# Patient Record
Sex: Female | Born: 1955 | Race: White | Hispanic: No | Smoking: Current every day smoker
Health system: Southern US, Community
[De-identification: ages and names within clinical notes are randomized; demographics above are authoritative.]

## PROBLEM LIST (undated history)

## (undated) DIAGNOSIS — I10 Essential (primary) hypertension: Secondary | ICD-10-CM

## (undated) DIAGNOSIS — Z9889 Other specified postprocedural states: Secondary | ICD-10-CM

## (undated) DIAGNOSIS — C50919 Malignant neoplasm of unspecified site of unspecified female breast: Secondary | ICD-10-CM

## (undated) DIAGNOSIS — M1990 Unspecified osteoarthritis, unspecified site: Secondary | ICD-10-CM

## (undated) DIAGNOSIS — M8580 Other specified disorders of bone density and structure, unspecified site: Secondary | ICD-10-CM

## (undated) DIAGNOSIS — E785 Hyperlipidemia, unspecified: Secondary | ICD-10-CM

## (undated) DIAGNOSIS — J189 Pneumonia, unspecified organism: Secondary | ICD-10-CM

## (undated) HISTORY — DX: Malignant neoplasm of unspecified site of unspecified female breast: C50.919

## (undated) HISTORY — PX: SALPINGECTOMY: SHX328

## (undated) HISTORY — PX: APPENDECTOMY: SHX54

## (undated) HISTORY — PX: ABDOMINAL HYSTERECTOMY: SHX81

## (undated) HISTORY — PX: KNEE ARTHROSCOPY WITH MENISCAL REPAIR: SHX5653

## (undated) HISTORY — PX: KNEE SURGERY: SHX244

## (undated) HISTORY — PX: OTHER SURGICAL HISTORY: SHX169

## (undated) HISTORY — DX: Other specified disorders of bone density and structure, unspecified site: M85.80

## (undated) HISTORY — PX: CHOLECYSTECTOMY: SHX55

## (undated) HISTORY — PX: SALPINGOOPHORECTOMY: SHX82

---

## 2011-05-12 DIAGNOSIS — Z9109 Other allergy status, other than to drugs and biological substances: Secondary | ICD-10-CM | POA: Insufficient documentation

## 2011-05-12 DIAGNOSIS — K219 Gastro-esophageal reflux disease without esophagitis: Secondary | ICD-10-CM | POA: Insufficient documentation

## 2014-02-13 ENCOUNTER — Observation Stay: Payer: Self-pay | Admitting: Internal Medicine

## 2014-02-13 LAB — URINALYSIS, COMPLETE
BILIRUBIN, UR: NEGATIVE
Bacteria: NONE SEEN
Glucose,UR: NEGATIVE mg/dL (ref 0–75)
KETONE: NEGATIVE
Leukocyte Esterase: NEGATIVE
NITRITE: NEGATIVE
Ph: 7 (ref 4.5–8.0)
Protein: NEGATIVE
RBC,UR: 1 /HPF (ref 0–5)
Specific Gravity: 1.019 (ref 1.003–1.030)
Squamous Epithelial: NONE SEEN
WBC UR: NONE SEEN /HPF (ref 0–5)

## 2014-02-13 LAB — CBC
HCT: 41.6 % (ref 35.0–47.0)
HGB: 13.7 g/dL (ref 12.0–16.0)
MCH: 28.4 pg (ref 26.0–34.0)
MCHC: 32.8 g/dL (ref 32.0–36.0)
MCV: 87 fL (ref 80–100)
Platelet: 235 10*3/uL (ref 150–440)
RBC: 4.8 10*6/uL (ref 3.80–5.20)
RDW: 13.9 % (ref 11.5–14.5)
WBC: 8.8 10*3/uL (ref 3.6–11.0)

## 2014-02-13 LAB — COMPREHENSIVE METABOLIC PANEL
ALK PHOS: 95 U/L
Albumin: 3.9 g/dL (ref 3.4–5.0)
Anion Gap: 6 — ABNORMAL LOW (ref 7–16)
BILIRUBIN TOTAL: 0.3 mg/dL (ref 0.2–1.0)
BUN: 9 mg/dL (ref 7–18)
CO2: 25 mmol/L (ref 21–32)
CREATININE: 0.88 mg/dL (ref 0.60–1.30)
Calcium, Total: 9 mg/dL (ref 8.5–10.1)
Chloride: 109 mmol/L — ABNORMAL HIGH (ref 98–107)
EGFR (Non-African Amer.): >60
Glucose: 90 mg/dL (ref 65–99)
OSMOLALITY: 278 (ref 275–301)
POTASSIUM: 3.6 mmol/L (ref 3.5–5.1)
SGOT(AST): 24 U/L (ref 15–37)
SGPT (ALT): 15 U/L (ref 12–78)
Sodium: 140 mmol/L (ref 136–145)
TOTAL PROTEIN: 7.4 g/dL (ref 6.4–8.2)

## 2014-02-13 LAB — LIPASE, BLOOD: LIPASE: 165 U/L (ref 73–393)

## 2014-02-13 LAB — HEMOGLOBIN: HGB: 13 g/dL (ref 12.0–16.0)

## 2014-02-14 LAB — CBC WITH DIFFERENTIAL/PLATELET
BASOS ABS: 0 10*3/uL (ref 0.0–0.1)
Basophil %: 0.3 %
EOS PCT: 2 %
Eosinophil #: 0.1 10*3/uL (ref 0.0–0.7)
HCT: 39.3 % (ref 35.0–47.0)
HGB: 12.9 g/dL (ref 12.0–16.0)
Lymphocyte #: 1.3 10*3/uL (ref 1.0–3.6)
Lymphocyte %: 24.9 %
MCH: 28.7 pg (ref 26.0–34.0)
MCHC: 32.9 g/dL (ref 32.0–36.0)
MCV: 87 fL (ref 80–100)
Monocyte #: 0.5 x10 3/mm (ref 0.2–0.9)
Monocyte %: 8.8 %
Neutrophil #: 3.3 10*3/uL (ref 1.4–6.5)
Neutrophil %: 64 %
PLATELETS: 203 10*3/uL (ref 150–440)
RBC: 4.51 10*6/uL (ref 3.80–5.20)
RDW: 13.5 % (ref 11.5–14.5)
WBC: 5.2 10*3/uL (ref 3.6–11.0)

## 2014-02-14 LAB — TSH: Thyroid Stimulating Horm: 2.62 u[IU]/mL

## 2014-02-14 LAB — BASIC METABOLIC PANEL
ANION GAP: 1 — AB (ref 7–16)
BUN: 7 mg/dL (ref 7–18)
CO2: 29 mmol/L (ref 21–32)
CREATININE: 0.9 mg/dL (ref 0.60–1.30)
Calcium, Total: 8.2 mg/dL — ABNORMAL LOW (ref 8.5–10.1)
Chloride: 112 mmol/L — ABNORMAL HIGH (ref 98–107)
EGFR (African American): >60
EGFR (Non-African Amer.): >60
Glucose: 83 mg/dL (ref 65–99)
Osmolality: 280 (ref 275–301)
Potassium: 3.9 mmol/L (ref 3.5–5.1)
SODIUM: 142 mmol/L (ref 136–145)

## 2014-02-14 LAB — MAGNESIUM: Magnesium: 2 mg/dL

## 2015-01-08 NOTE — Consult Note (Signed)
PATIENT NAME:  Annette Richard, Annette Richard MR#:  [id number] DATE OF BIRTH:  11-Mar-1956  DATE OF CONSULTATION:  02/13/2014  CONSULTING PHYSICIAN:  Annette Sails, MD  Patient of Dr. Bridgett Richard  REASON FOR CONSULTATION: Lower GI bleed/hematochezia.   HISTORY OF PRESENT ILLNESS: [name] is a 59 year old Caucasian female who was in her usual state of health until yesterday morning. When she awoke in the morning, she found that she was somewhat shaky, and when she had a bowel movement she noted blood in the toilet. This was bright red in nature. Since that time, she has had multiple smaller episodes of bleeding. She relates having some pain in her left side and left lower quadrant. She had no pain, however, before the episode. There has been some nausea, but no vomiting. She states that she had a similar episode last year, and was admitted to the hospital in [facility]. At that time, she underwent a colonoscopy with polypectomy and finding of internal hemorrhoids. She states that the hemorrhoids were banded. She also had a cholecystectomy at that time. She does have occasional heartburn, perhaps less than once a week, occasionally using Prilosec. There is no dysphagia. In terms of bowel habits, she usually has a daily bowel movement. She has seen no black stool, blood in the stool, or slimy stools. Otherwise, she has had no change to her bowel habits. Currently she is hemodynamically stable. She states that the last time she saw some blood was when she was downstairs in the Emergency Room. She did not have a rectal examination in the Emergency Room.   PAST MEDICAL HISTORY: She has a history of a cholecystectomy, that being done about a year ago. She denies any other ongoing medical treatment. She has not had any other surgeries.  The patient does smoke tobacco. She has a history of a pulmonary nodule. Gastrointestinal family history is negative for colorectal cancer, liver disease, or ulcers. The patient herself has  never had an ulcer in the past.   OUTPATIENT MEDICATIONS:  None.  PHYSICAL EXAMINATION: VITAL SIGNS: Temperature is 97.9, pulse 58, respirations 18, blood pressure 130/83, pulse ox 99%.  GENERAL: She is a 59 year old Caucasian female in no acute distress, somewhat nervous.  HEENT: Normocephalic, atraumatic. Eyes are anicteric. Nose: Septum midline, no lesions.  Oropharynx: No lesions.  NECK: Supple. No JVD. No lymphadenopathy. No thyromegaly.  HEART: Regular rate and rhythm.  LUNGS: Clear.  ABDOMEN: Soft. She is tender to palpation in the left abdomen, extending from the lower left to the upper left abdomen. There are no masses, rebound, or organomegaly. Bowel sounds positive, normoactive.  RECTAL: Anorectal examination shows no evidence of gross bleeding in the rectal vault. There is some mild mucus in the vault. This is not heme-stained. There is what appears to be a small spot on an external skin tag, possibly a decompressed hemorrhoid. This is slightly tender to palpation.   LABORATORIES INCLUDE THE FOLLOWING: On admission, she had a glucose of 90, BUN 9, creatinine 0.88, sodium 140, potassium 3.6, chloride 109, bicarb 25, calcium 9.0, lipase 165. Hepatic profile is normal. Hemogram is normal, with a hemoglobin and hematocrit of 13.7/41.6, respectively. Urinalysis showed 2+ blood, leukocyte esterase negative. The RBC count, however, was 1 per high-power field, white cell count was none.   She had a CT scan of the abdomen and pelvis with contrast IV, this showed impression of:  1. No explanation for patient's abdominal pain and rectal bleeding.  2. Moderate colonic stool burden  without evidence of obstruction.  3. Colonic diverticulosis without evidence of diverticulitis.  4. Post-cholecystectomy, appendectomy, and hysterectomy.  5. Indeterminate 3 mm nodule in the imaged left lower lobe, with recommendation to repeat the CT scan in a year. She is a high risk for bronchogenic carcinoma.    ASSESSMENT:  1. Rectal bleeding. The vault is empty, and there is no evidence of melena. There is, however, heme-positive mucus on the glove. This is quite likely related with the possibility of hemorrhoid. There is what appears to be a decompressed hemorrhoid on the outside.   2. Left-sided abdominal pain of uncertain etiology. The patient does have diverticulosis on a CT scan.   RECOMMENDATION: 1. Will start local treatment for anal outlet bleeding/hemorrhoidal bleeding.  2. Discontinue IV proton pump inhibitor drip.  3. Will follow. Recheck a hemoglobin in about 8 hours or so, transfuse if needed.      ____________________________ Annette Sails, MD mus:mr D: 02/13/2014 17:55:52 ET T: 02/13/2014 18:05:41 ET JOB#: [id number]  cc: Annette Sails, MD, <Dictator> Annette Sails MD ELECTRONICALLY SIGNED 03/11/2014 9:08

## 2015-01-08 NOTE — Discharge Summary (Signed)
PATIENT NAME:  Annette Richard, Annette Richard MR#:  [id number] DATE OF BIRTH:  13-Jun-1956  DATE OF ADMISSION:  02/13/2014  DATE OF DISCHARGE:  02/14/2014  ADMITTING DIAGNOSIS: Rectal bleeding.  DISCHARGE DIAGNOSES:  1.  Rectal bleeding from hemorrhoids.  2.  A 3 mm nodule in the left lower lobe.  3.  Smoking dependence.   CONSULTATIONS: Dr. Gustavo Lah.   DATA:  CT scan showed an indeterminate 3 mm nodule within the imaged left lower lobe. She has moderate colonic stool burden without evidence of obstruction. Discharge white blood cells 5.2, hemoglobin 13, hematocrit 39.3, platelets are 203.   HOSPITAL COURSE: A very pleasant, 59 year old female who presented with rectal bleeding. For further details, please refer to the H and P. 1.  Rectal bleeding secondary to external hemorrhoids. GI was consulted. Dr. Gustavo Lah felt that this was related to her external hemorrhoids. He recommended external hemorrhoid cream. The patient had no bleeding while in the hospital. 2.  A 3 mm nodule. I explained to the patient that she has abnormal finding, and given her history of smoking dependence, she needs close followup. She does not have a PCP, but she says she will find a PCP, and I stressed the importance of making sure that she has a followup CAT scan, and also wrote this in her discharge instructions. 3.  Smoking dependence. The patient will be discharged with a nicotine patch.   DISCHARGE MEDICATIONS: 1.  Protonix 40 mg daily.  2.  Nicotine patch 14 mg over 24 hours.  3.  Hydrocortisone 1 application t.i.d. for 9 days.   INSTRUCTIONS: Discharge diet: Regular diet. Discharge activity: As tolerated. Discharge followup: The patient will follow up with an M.D. Again, it was stressed to her the importance of repeating a CT chest in 1 year to evaluate this nodule.   TIME SPENT: 35 minutes.   The patient was medically stable for discharge.    ____________________________ Dewight Catino P. Benjie Karvonen, MD spm:mr D: 02/14/2014  11:40:02 ET T: 02/14/2014 17:13:08 ET JOB#: [id number]  cc: Viviene Thurston P. Benjie Karvonen, MD, <Dictator> Donell Beers Yolonda Purtle MD ELECTRONICALLY SIGNED 02/15/2014 7:26

## 2015-01-08 NOTE — Consult Note (Signed)
Brief Consult Note: Diagnosis: rectal bleeding.   Patient was seen by consultant.   Consult note dictated.   Recommend further assessment or treatment.   Orders entered.   Comments: Please see full GI consult [phone].  Patietn admitted with multiple eposodes of "bright red rectal bleeing"  Some c/o left sided abdominal pain.  DRE negative for melena or obvious blood in the rectal vault.  Evidence of external hemorrhois with possible stigmata of recent bleeding.   Will d.c protonix drip, place on single dose daily of omeprazole which she uses at home for occasional GERD sx.  Will start rectal corticosteroid for anal outlet bleeding.  Continue observation, following. .  Electronic Signatures: Loistine Simas (MD)  (Signed 30-May-15 18:00)  Authored: Brief Consult Note   Last Updated: 30-May-15 18:00 by Loistine Simas (MD)

## 2015-01-08 NOTE — H&P (Signed)
PATIENT NAME:  Annette, Richard MR#:  [id number] DATE OF BIRTH:  [birth year]  DATE OF ADMISSION:  02/13/2014  PRIMARY CARE PHYSICIAN:  No local.  REFERRING PHYSICIAN:  Dr. Benjaman Lobe.   CHIEF COMPLAINT:  Bloody stools since yesterday.   HISTORY OF PRESENT ILLNESS:  A 59 year old Caucasian female with no past medical history, presented to the ED with bloody stools since yesterday. The patient noticed to have a fresh bloody stool in the toilet yesterday. She said that it was lot. In addition, the patient has multiple times of bloody stool since yesterday. The patient feels weak and dizzy. Also she complains of abdominal cramp on the left side associated with nausea. The patient had a colonoscopy one year ago. A polyp was removed.   PAST MEDICAL HISTORY:  Annette Richard.  PAST SURGICAL HISTORY:  Cholecystectomy, hysterectomy, knee surgery, hemorrhoid surgery.   SOCIAL HISTORY:  Smokes 1 pack a day for 30 to 40 years. No alcohol drinking or illicit drugs.   FAMILY HISTORY:  Father died of pulmonary fibrosis. Brother has pulmonary fibrosis. Mother has colon cancer. Sister has breast cancer.   ALLERGIES:  No.   MEDICATIONS:  No.  REVIEW OF SYSTEMS: CONSTITUTIONAL:  The patient denies any fever or chills. No headache but has weakness and dizziness.  EYES:  No double vision or blurred vision.  ENT:  No postnasal drip, slurred speech or dysphagia.  CARDIOVASCULAR:  No chest pain, palpitation, orthopnea, nocturnal dyspnea. No leg edema.  PULMONARY:  No cough, sputum, shortness of breath or hemoptysis.  GASTROINTESTINAL:  Abdominal cramps on the left side and nausea, no vomiting or diarrhea but has bloody stool.  GENITOURINARY:  No dysuria, hematuria or incontinence.  SKIN:  No rash or jaundice.  NEUROLOGY:  No syncope, loss of consciousness or seizure.  HEMATOLOGIC:  No easy bruising or bleeding but has rectal bleeding.   ENDOCRINE: No polyuria, polydipsia, heat or cold intolerance.   PHYSICAL  EXAMINATION:  VITAL SIGNS:  Temperature 98.2, blood pressure 161/83, pulse 60, oxygen saturation 98% on room air.  GENERAL:  Alert, awake, oriented, in no acute distress.  HEENT:  Pupils round, equal and reactive to light and accommodation. Moist oral mucosa. Clear oropharynx.  NECK:  Supple. No JVD or carotid bruit. No lymphadenopathy, no thyromegaly.  CARDIOVASCULAR:  S1, S2 regular rate and rhythm. No murmurs or gallops.  PULMONARY:  Bilateral air entry. No wheezing or rales. No use of accessory muscles to breathe.  ABDOMEN:  Soft. No distention but has tenderness on the left side. No rebound. No rigidity. Bowel sounds present. No obvious organomegaly.  EXTREMITIES:  No edema, clubbing or cyanosis. No calf tenderness. Bilateral pedal pulses present.  NEUROLOGIC:  A and O x 3. No focal deficit. Power 5/5. Sensory intact.   LABORATORY, DIAGNOSTIC AND RADIOLOGICAL DATA:  1.  CAT scan of abdomen and pelvis showed moderate, chronic stool burden without evidence of obstruction. Colonic diverticulosis without diverticulitis.  2.  Left lower lobe:  A 3 mm nodule.  3.  Glucose 90, BUN 9, creatinine 0.88. Electrolytes normal. CBC normal. Lipase 165. Urinalysis is negative. EKG showed sinus bradycardia at 57 BPM.   IMPRESSIONS:  1.  Gastrointestinal bleeding possibly due to diverticulosis.  2.  Pulmonary nodule.  3.  Tobacco abuse.    PLAN OF TREATMENT:  1.  The patient will be admitted to medical floor. We will continue to monitor hemoglobin q.8 hours. Start a Protonix drip and we will get a Gastroenterology consult.  2.  Smoking cessation was counseled for 5 minutes.  3.  For pulmonary nodule, the patient may need followup CAT scan as outpatient after discharge.  4.  I discussed the patient's condition and plan of treatment with the patient.   TIME SPENT:  About 52 minutes.   ____________________________ Demetrios Loll, MD qc:jm D: 02/13/2014 15:39:28 ET T: 02/13/2014 17:45:58  ET JOB#: [id number]  cc: Demetrios Loll, MD, <Dictator> Demetrios Loll MD ELECTRONICALLY SIGNED 02/13/2014 18:22

## 2015-03-24 DIAGNOSIS — I10 Essential (primary) hypertension: Secondary | ICD-10-CM | POA: Insufficient documentation

## 2015-03-25 DIAGNOSIS — E782 Mixed hyperlipidemia: Secondary | ICD-10-CM | POA: Insufficient documentation

## 2015-10-30 ENCOUNTER — Emergency Department
Admission: EM | Admit: 2015-10-30 | Discharge: 2015-10-30 | Disposition: A | Discharge status: Home / Self Care | Payer: Self-pay | Attending: Emergency Medicine | Admitting: Emergency Medicine

## 2015-10-30 ENCOUNTER — Emergency Department: Payer: Self-pay

## 2015-10-30 DIAGNOSIS — B9789 Other viral agents as the cause of diseases classified elsewhere: Secondary | ICD-10-CM

## 2015-10-30 DIAGNOSIS — J069 Acute upper respiratory infection, unspecified: Secondary | ICD-10-CM | POA: Insufficient documentation

## 2015-10-30 DIAGNOSIS — J988 Other specified respiratory disorders: Secondary | ICD-10-CM

## 2015-10-30 LAB — CBC WITH DIFFERENTIAL/PLATELET
Basophils Absolute: 0.1 10*3/uL (ref 0–0.1)
Basophils Relative: 1 %
Eosinophils Absolute: 0.1 10*3/uL (ref 0–0.7)
Eosinophils Relative: 2 %
HCT: 38.7 % (ref 35.0–47.0)
Hemoglobin: 13.3 g/dL (ref 12.0–16.0)
Lymphocytes Relative: 11 %
Lymphs Abs: 0.7 10*3/uL — ABNORMAL LOW (ref 1.0–3.6)
MCH: 29.8 pg (ref 26.0–34.0)
MCHC: 34.3 g/dL (ref 32.0–36.0)
MCV: 86.8 fL (ref 80.0–100.0)
Monocytes Absolute: 0.6 10*3/uL (ref 0.2–0.9)
Monocytes Relative: 9 %
Neutro Abs: 5.1 10*3/uL (ref 1.4–6.5)
Neutrophils Relative %: 77 %
Platelets: 213 10*3/uL (ref 150–440)
RBC: 4.46 MIL/uL (ref 3.80–5.20)
RDW: 13.5 % (ref 11.5–14.5)
WBC: 6.5 10*3/uL (ref 3.6–11.0)

## 2015-10-30 LAB — COMPREHENSIVE METABOLIC PANEL
ALK PHOS: 73 U/L (ref 38–126)
ALT: 10 U/L — AB (ref 14–54)
AST: 14 U/L — AB (ref 15–41)
Albumin: 3.8 g/dL (ref 3.5–5.0)
Anion gap: 10 (ref 5–15)
BILIRUBIN TOTAL: 0.4 mg/dL (ref 0.3–1.2)
BUN: 12 mg/dL (ref 6–20)
CALCIUM: 8.4 mg/dL — AB (ref 8.9–10.3)
CHLORIDE: 111 mmol/L (ref 101–111)
CO2: 19 mmol/L — ABNORMAL LOW (ref 22–32)
CREATININE: 0.91 mg/dL (ref 0.44–1.00)
Glucose, Bld: 101 mg/dL — ABNORMAL HIGH (ref 65–99)
Potassium: 3.3 mmol/L — ABNORMAL LOW (ref 3.5–5.1)
Sodium: 140 mmol/L (ref 135–145)
TOTAL PROTEIN: 7 g/dL (ref 6.5–8.1)

## 2015-10-30 LAB — RAPID INFLUENZA A&B ANTIGENS (ARMC ONLY): INFLUENZA B (ARMC): NOT DETECTED

## 2015-10-30 LAB — LACTIC ACID, PLASMA: Lactic Acid, Venous: 0.7 mmol/L (ref 0.5–2.0)

## 2015-10-30 LAB — RAPID INFLUENZA A&B ANTIGENS: Influenza A (ARMC): NOT DETECTED

## 2015-10-30 MED ORDER — OSELTAMIVIR PHOSPHATE 75 MG PO CAPS: 75.0000 mg | ORAL_CAPSULE | Freq: Two times a day (BID) | ORAL | Status: DC

## 2015-10-30 MED ORDER — POTASSIUM CHLORIDE CRYS ER 20 MEQ PO TBCR
40.0000 meq | EXTENDED_RELEASE_TABLET | Freq: Once | ORAL | Status: AC
Start: 2015-10-30 — End: 2015-10-30
  Administered 2015-10-30: 40 meq via ORAL
  Filled 2015-10-30: qty 2

## 2015-10-30 MED ORDER — ONDANSETRON 4 MG PO TBDP: 4.0000 mg | ORAL_TABLET | Freq: Four times a day (QID) | ORAL | Status: DC | PRN

## 2015-10-30 MED ORDER — SODIUM CHLORIDE 0.9 % IV BOLUS (SEPSIS)
1000.0000 mL | Freq: Once | INTRAVENOUS | Status: AC
  Administered 2015-10-30: 1000 mL via INTRAVENOUS

## 2015-10-30 MED ORDER — OSELTAMIVIR PHOSPHATE 75 MG PO CAPS
75.0000 mg | ORAL_CAPSULE | ORAL | Status: AC
  Administered 2015-10-30: 75 mg via ORAL
  Filled 2015-10-30 (×2): qty 1

## 2015-10-30 MED ORDER — IBUPROFEN 600 MG PO TABS
600.0000 mg | ORAL_TABLET | ORAL | Status: AC
  Administered 2015-10-30: 600 mg via ORAL
  Filled 2015-10-30: qty 1

## 2015-10-30 MED ORDER — ACETAMINOPHEN 500 MG PO TABS
1000.0000 mg | ORAL_TABLET | Freq: Once | ORAL | Status: AC
  Administered 2015-10-30: 1000 mg via ORAL

## 2015-10-30 MED ORDER — ACETAMINOPHEN 500 MG PO TABS
ORAL_TABLET | ORAL | Status: AC
  Administered 2015-10-30: 1000 mg via ORAL
  Filled 2015-10-30: qty 2

## 2015-10-30 NOTE — ED Notes (Signed)
Pharmacy called for tamiflu capsule ,will D/C after

## 2015-10-30 NOTE — ED Provider Notes (Signed)
[facility] Emergency Department Provider Note  ____________________________________________  Time seen: Approximately 7:24 PM  I have reviewed the triage vital signs and the nursing notes.   HISTORY  Chief Complaint Cough    HPI Annette Richard is a 60 y.o. female history of hypertension and hyperlipidemia. Patient reports that for the last 2 days she's been feeling muscle aches, fevers, chills, dry nonproductive cough and runny nose.  She reports she feels slightly dehydrated. She has had some mild nausea which is resolved but no abdominal pain, chest pain, rash or other concerns. 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  No past medical history on file.  There are no active problems to display for this patient.  No known allergies No past surgical history on file.  Current Outpatient Rx  Name  Route  Sig  Dispense  Refill  . ondansetron (ZOFRAN ODT) 4 MG disintegrating tablet   Oral   Take 1 tablet (4 mg total) by mouth every 6 (six) hours as needed for nausea or vomiting.   20 tablet   0   . oseltamivir (TAMIFLU) 75 MG capsule   Oral   Take 1 capsule (75 mg total) by mouth 2 (two) times daily.   10 capsule   0     Allergies Review of patient's allergies indicates no known allergies.  No family history on file.  Social History Social History  Substance Use Topics  . Smoking status: Not on file  . Smokeless tobacco: Not on file  . Alcohol Use: Not on file   no drug use, no heavy alcohol use  Review of Systems Constitutional: Fevers and chills Eyes: No visual changes. ENT: No sore throat. Slightly scratchy but no trouble breathing. Cardiovascular: Denies chest pain. Respiratory: Denies shortness of breath. Does report frequent nonproductive cough. Gastrointestinal: No abdominal pain.  no  vomiting.  No diarrhea.  No constipation. Genitourinary: Negative for dysuria. Denies any symptoms that feel like a "UTI" Musculoskeletal: Negative for back pain. Skin: Negative for rash. Neurological: Negative for headaches, focal weakness or numbness.  10-point ROS otherwise negative.  ____________________________________________   PHYSICAL EXAM:  VITAL SIGNS: ED Triage Vitals  Enc Vitals Group     BP 10/30/15 1653 127/78 mmHg     Pulse Rate 10/30/15 1653 106     Resp 10/30/15 1653 20     Temp 10/30/15 1653 101.8 F (38.8 C)     Temp Source 10/30/15 1653 Oral     SpO2 10/30/15 1653 97 %     Weight 10/30/15 1653 142 lb (64.411 kg)     Height 10/30/15 1653 5\' 6"  (1.676 m)     Head Cir --      Peak Flow --      Pain Score 10/30/15 1654 8     Pain Loc --      Pain Edu? --      Excl. in [place]? --    Constitutional: Alert and oriented. Mildly ill-appearing but in no distress. Very pleasant and amicable. Eyes: Conjunctivae are normal. PERRL. EOMI. Head: Atraumatic. Nose: No congestion/rhinnorhea. Mouth/Throat: Mucous membranes are slightly dry.  Oropharynx non-erythematous. Neck: No stridor.  No meningismus. Cardiovascular: Normal rate, regular rhythm. Grossly normal heart sounds.  Good peripheral circulation. Respiratory: Normal respiratory effort.  No retractions. Lungs CTAB though she does occasionally demonstrate a dry nonproductive cough.  Gastrointestinal: Soft and nontender. No distention. No abdominal bruits. No CVA tenderness. Musculoskeletal: No lower extremity tenderness nor edema.  No joint effusions. Neurologic:  Normal speech and language. No gross focal neurologic deficits are appreciated. Skin:  Skin is warm, dry and intact. No rash noted. Psychiatric: Mood and affect are normal. Speech and behavior are normal.  ____________________________________________   LABS (all labs ordered are listed, but only abnormal results are displayed)  Labs Reviewed  CBC WITH  DIFFERENTIAL/PLATELET - Abnormal; Notable for the following:    Lymphs Abs 0.7 (*)    All other components within normal limits  COMPREHENSIVE METABOLIC PANEL - Abnormal; Notable for the following:    Potassium 3.3 (*)    CO2 19 (*)    Glucose, Bld 101 (*)    Calcium 8.4 (*)    AST 14 (*)    ALT 10 (*)    All other components within normal limits  RAPID INFLUENZA A&B ANTIGENS ([facility] ONLY)  LACTIC ACID, PLASMA  LACTIC ACID, PLASMA   ____________________________________________  EKG  ED ECG REPORT I, Keisi Eckford, the attending physician, personally viewed and interpreted this ECG.  Date: 10/30/2015 EKG Time: 1651 Rate: 100 Rhythm: normal sinus rhythm QRS Axis: normal Intervals: normal ST/T Wave abnormalities: normal Conduction Disturbances: none Narrative Interpretation: unremarkable  ____________________________________________  RADIOLOGY      DG Chest 2 View (Final result) Result time: 10/30/15 17:45:36   Final result by Rad Results In Interface (10/30/15 17:45:36)   Narrative:   CLINICAL DATA: Cough with fever for 2 days  EXAM: CHEST 2 VIEW  COMPARISON: None.  FINDINGS: Cardiac shadow is within normal limits. The lungs are well aerated bilaterally. Mild interstitial changes are noted without focal confluent infiltrate. No sizable effusion is noted. No bony abnormality is seen.  IMPRESSION: No acute abnormality noted.   Electronically Signed By: Inez Catalina M.D. On: 10/30/2015 17:45    ____________________________________________   PROCEDURES  Procedure(s) performed: None  Critical Care performed: No  ____________________________________________   INITIAL IMPRESSION / ASSESSMENT AND PLAN / ED COURSE  Pertinent labs & imaging results that were available during my care of the patient were reviewed by me and considered in my medical decision making (see chart for details).  Primarily pulmonary and upper respiratory symptoms  associated with a fever. No chest pain or symptoms to suggest acute coronary syndrome, dissection, pulmonary pulmonary embolism. Patient is notably febrile, myalgias, coryza and symptoms really concerning for acute influenza.  She does appear slightly dry and dehydrated, we will hydrate her here, follow her clinically and send an influenza test. Clinically she does appear nontoxic, does not demonstrate any overt evidence of sepsis by clinical exam such as hypotension, altered mental status. Not immunosuppressed.  Patient awake alert with normal hemodynamics and reevaluation. She feels much improved. Appears to most likely have a viral illness, but in the setting of being exposed to influenza with fever, myalgias, upper respiratory symptoms did offer treatment and we discussed specific risks and side effects of Tamiflu, for which the patient elected she would like treatment even though her influenza test is negative rate given her multiple risk factors and peak influenza season things is reasonable to treat. Patient will follow-up closely with her doctor in very careful return precautions discussed for which she is very agreeable. Patient appears much improved.  ____________________________________________   FINAL CLINICAL IMPRESSION(S) / ED DIAGNOSES  Final diagnoses:  Viral respiratory illness      Delman Kitten, MD 10/30/15 2332

## 2015-10-30 NOTE — ED Notes (Signed)
Pt states that she woke up yesterday with a cough and states that it reminded her of bronchitis, pt states that today she is worse, fever, bodyaches, burning through out her chest

## 2015-10-30 NOTE — Discharge Instructions (Signed)
You were diagnosed with the flu (influenza) or a "flu-like" viral illness.  You will feel ill for as much as a few weeks.  Please take any prescribed medications as instructed, and you may use over-the-counter Tylenol and/or ibuprofen as needed according to label instructions (unless you have an allergy to either or have been told by your doctor not to take them).  Follow up with your physician as instructed above, and return to the Emergency Department (ED) if you are unable to tolerate fluids due to vomiting, have worsening trouble breathing, become extremely tired or difficult to awaken, or if you develop any other symptoms that concern you.   Viral Infections A viral infection can be caused by different types of viruses.Most viral infections are not serious and resolve on their own. However, some infections may cause severe symptoms and may lead to further complications. SYMPTOMS Viruses can frequently cause:  Minor sore throat.  Aches and pains.  Headaches.  Runny nose.  Different types of rashes.  Watery eyes.  Tiredness.  Cough.  Loss of appetite.  Gastrointestinal infections, resulting in nausea, vomiting, and diarrhea. These symptoms do not respond to antibiotics because the infection is not caused by bacteria. However, you might catch a bacterial infection following the viral infection. This is sometimes called a "superinfection." Symptoms of such a bacterial infection may include:  Worsening sore throat with pus and difficulty swallowing.  Swollen neck glands.  Chills and a high or persistent fever.  Severe headache.  Tenderness over the sinuses.  Persistent overall ill feeling (malaise), muscle aches, and tiredness (fatigue).  Persistent cough.  Yellow, green, or brown mucus production with coughing. HOME CARE INSTRUCTIONS   Only take over-the-counter or prescription medicines for pain, discomfort, diarrhea, or fever as directed by your caregiver.  Drink  enough water and fluids to keep your urine clear or pale yellow. Sports drinks can provide valuable electrolytes, sugars, and hydration.  Get plenty of rest and maintain proper nutrition. Soups and broths with crackers or rice are fine. SEEK IMMEDIATE MEDICAL CARE IF:   You have severe headaches, shortness of breath, chest pain, neck pain, or an unusual rash.  You have uncontrolled vomiting, diarrhea, or you are unable to keep down fluids.  You or your child has an oral temperature above 102 F (38.9 C), not controlled by medicine.  Your baby is older than 3 months with a rectal temperature of 102 F (38.9 C) or higher.  Your baby is 60 months old or younger with a rectal temperature of 100.4 F (38 C) or higher. MAKE SURE YOU:   Understand these instructions.  Will watch your condition.  Will get help right away if you are not doing well or get worse.   This information is not intended to replace advice given to you by your health care provider. Make sure you discuss any questions you have with your health care provider.   Document Released: 06/13/2005 Document Revised: 11/26/2011 Document Reviewed: 02/09/2015 Elsevier Interactive Patient Education [employer].

## 2016-06-26 ENCOUNTER — Observation Stay: Payer: BLUE CROSS/BLUE SHIELD

## 2016-06-26 ENCOUNTER — Encounter: Payer: Self-pay | Admitting: Emergency Medicine

## 2016-06-26 ENCOUNTER — Observation Stay
Admission: EM | Admit: 2016-06-26 | Discharge: 2016-06-27 | Disposition: A | Discharge status: Home / Self Care | Payer: BLUE CROSS/BLUE SHIELD | Attending: Internal Medicine | Admitting: Internal Medicine

## 2016-06-26 ENCOUNTER — Emergency Department: Payer: BLUE CROSS/BLUE SHIELD

## 2016-06-26 ENCOUNTER — Observation Stay
Admit: 2016-06-26 | Discharge: 2016-06-26 | Disposition: A | Discharge status: Home / Self Care | Payer: BLUE CROSS/BLUE SHIELD | Attending: Internal Medicine | Admitting: Internal Medicine

## 2016-06-26 DIAGNOSIS — I10 Essential (primary) hypertension: Secondary | ICD-10-CM | POA: Diagnosis not present

## 2016-06-26 DIAGNOSIS — E785 Hyperlipidemia, unspecified: Secondary | ICD-10-CM | POA: Diagnosis not present

## 2016-06-26 DIAGNOSIS — M1990 Unspecified osteoarthritis, unspecified site: Secondary | ICD-10-CM | POA: Insufficient documentation

## 2016-06-26 DIAGNOSIS — Z823 Family history of stroke: Secondary | ICD-10-CM | POA: Diagnosis not present

## 2016-06-26 DIAGNOSIS — F17200 Nicotine dependence, unspecified, uncomplicated: Secondary | ICD-10-CM | POA: Diagnosis not present

## 2016-06-26 DIAGNOSIS — H538 Other visual disturbances: Secondary | ICD-10-CM | POA: Insufficient documentation

## 2016-06-26 DIAGNOSIS — I639 Cerebral infarction, unspecified: Secondary | ICD-10-CM

## 2016-06-26 DIAGNOSIS — G459 Transient cerebral ischemic attack, unspecified: Principal | ICD-10-CM

## 2016-06-26 HISTORY — DX: Unspecified osteoarthritis, unspecified site: M19.90

## 2016-06-26 HISTORY — DX: Cerebral infarction, unspecified: I63.9

## 2016-06-26 LAB — COMPREHENSIVE METABOLIC PANEL
ALBUMIN: 4.1 g/dL (ref 3.5–5.0)
ALT: 10 U/L — ABNORMAL LOW (ref 14–54)
ANION GAP: 8 (ref 5–15)
AST: 15 U/L (ref 15–41)
Alkaline Phosphatase: 82 U/L (ref 38–126)
BILIRUBIN TOTAL: 0.4 mg/dL (ref 0.3–1.2)
BUN: 11 mg/dL (ref 6–20)
CO2: 23 mmol/L (ref 22–32)
Calcium: 9.2 mg/dL (ref 8.9–10.3)
Chloride: 107 mmol/L (ref 101–111)
Creatinine, Ser: 0.93 mg/dL (ref 0.44–1.00)
GFR calc Af Amer: >60 mL/min (ref >60)
GLUCOSE: 99 mg/dL (ref 65–99)
POTASSIUM: 3.8 mmol/L (ref 3.5–5.1)
Sodium: 138 mmol/L (ref 135–145)
TOTAL PROTEIN: 7.5 g/dL (ref 6.5–8.1)

## 2016-06-26 LAB — CBC WITH DIFFERENTIAL/PLATELET
Basophils Absolute: 0.1 10*3/uL (ref 0–0.1)
Basophils Relative: 1 %
Eosinophils Absolute: 0.3 10*3/uL (ref 0–0.7)
Eosinophils Relative: 4 %
HEMATOCRIT: 42.2 % (ref 35.0–47.0)
Hemoglobin: 14.3 g/dL (ref 12.0–16.0)
LYMPHS ABS: 2.9 10*3/uL (ref 1.0–3.6)
LYMPHS PCT: 29 %
MCH: 29.2 pg (ref 26.0–34.0)
MCHC: 33.8 g/dL (ref 32.0–36.0)
MCV: 86.4 fL (ref 80.0–100.0)
MONO ABS: 0.6 10*3/uL (ref 0.2–0.9)
MONOS PCT: 6 %
NEUTROS ABS: 6 10*3/uL (ref 1.4–6.5)
NEUTROS PCT: 60 %
Platelets: 283 10*3/uL (ref 150–440)
RBC: 4.88 MIL/uL (ref 3.80–5.20)
RDW: 13.2 % (ref 11.5–14.5)
WBC: 10 10*3/uL (ref 3.6–11.0)

## 2016-06-26 LAB — TSH: TSH: 1.748 u[IU]/mL (ref 0.350–4.500)

## 2016-06-26 MED ORDER — ACETAMINOPHEN 325 MG PO TABS: 650.0000 mg | ORAL_TABLET | Freq: Four times a day (QID) | ORAL | Status: DC | PRN

## 2016-06-26 MED ORDER — SODIUM CHLORIDE 0.9% FLUSH: 3.0000 mL | INTRAVENOUS | Status: DC | PRN

## 2016-06-26 MED ORDER — SODIUM CHLORIDE 0.9 % IV BOLUS (SEPSIS)
1000.0000 mL | Freq: Once | INTRAVENOUS | Status: AC
  Administered 2016-06-26: 1000 mL via INTRAVENOUS

## 2016-06-26 MED ORDER — ASPIRIN 81 MG PO CHEW
324.0000 mg | CHEWABLE_TABLET | Freq: Once | ORAL | Status: AC
  Administered 2016-06-26: 324 mg via ORAL
  Filled 2016-06-26: qty 4

## 2016-06-26 MED ORDER — SODIUM CHLORIDE 0.9 % IV SOLN: 250.0000 mL | INTRAVENOUS | Status: DC | PRN

## 2016-06-26 MED ORDER — HYDROCODONE-ACETAMINOPHEN 5-325 MG PO TABS: 1.0000 | ORAL_TABLET | ORAL | Status: DC | PRN

## 2016-06-26 MED ORDER — ONDANSETRON HCL 4 MG/2ML IJ SOLN: 4.0000 mg | Freq: Four times a day (QID) | INTRAMUSCULAR | Status: DC | PRN

## 2016-06-26 MED ORDER — ENOXAPARIN SODIUM 40 MG/0.4ML ~~LOC~~ SOLN
40.0000 mg | SUBCUTANEOUS | Status: DC
  Administered 2016-06-26: 40 mg via SUBCUTANEOUS
  Filled 2016-06-26: qty 0.4

## 2016-06-26 MED ORDER — ASPIRIN EC 325 MG PO TBEC
325.0000 mg | DELAYED_RELEASE_TABLET | Freq: Every day | ORAL | Status: DC
  Administered 2016-06-27: 325 mg via ORAL
  Filled 2016-06-26: qty 1

## 2016-06-26 MED ORDER — ONDANSETRON HCL 4 MG PO TABS: 4.0000 mg | ORAL_TABLET | Freq: Four times a day (QID) | ORAL | Status: DC | PRN

## 2016-06-26 MED ORDER — SODIUM CHLORIDE 0.9% FLUSH
3.0000 mL | Freq: Two times a day (BID) | INTRAVENOUS | Status: DC
  Administered 2016-06-26: 21:00:00 3 mL via INTRAVENOUS

## 2016-06-26 MED ORDER — STROKE: EARLY STAGES OF RECOVERY BOOK
Freq: Once | Status: AC
  Administered 2016-06-27

## 2016-06-26 MED ORDER — SODIUM CHLORIDE 0.9% FLUSH
3.0000 mL | Freq: Two times a day (BID) | INTRAVENOUS | Status: DC
  Administered 2016-06-26 – 2016-06-27 (×2): 3 mL via INTRAVENOUS

## 2016-06-26 MED ORDER — KETOROLAC TROMETHAMINE 15 MG/ML IJ SOLN: 15.0000 mg | Freq: Four times a day (QID) | INTRAMUSCULAR | Status: DC | PRN

## 2016-06-26 MED ORDER — METOCLOPRAMIDE HCL 5 MG/ML IJ SOLN
10.0000 mg | Freq: Once | INTRAMUSCULAR | Status: AC
  Administered 2016-06-26: 10 mg via INTRAVENOUS
  Filled 2016-06-26: qty 2

## 2016-06-26 MED ORDER — ACETAMINOPHEN 650 MG RE SUPP: 650.0000 mg | Freq: Four times a day (QID) | RECTAL | Status: DC | PRN

## 2016-06-26 NOTE — ED Notes (Signed)
Pt returned from [place]

## 2016-06-26 NOTE — ED Notes (Signed)
Pt in [place]. Will go to floor when done with [place]

## 2016-06-26 NOTE — ED Provider Notes (Signed)
[facility] Emergency Department Provider Note  ____________________________________________  Time seen: Approximately 3:23 PM  I have reviewed the triage vital signs and the nursing notes.   HISTORY  Chief Complaint Headache   HPI Annette Richard is a 60 y.o. female no significant past medical history who presents for evaluation of a headache. Patient denies prior history of headaches. She reports that yesterday evening she developed sudden onset of left-sided headache. She reports the headache has been alternating between 6 and 8 in intensity. She initially took some ibuprofen last night and the pain went away. Today she reports that the pain returned at 1:00 and she noticed some left-sided facial numbness/tingling. At 2:00 she went to her mailbox to pick up mail and noticed blurry vision with her right eye. The blurry vision resolved and with her right eye closed but it was present on both eyes were open or the left eye was closed which prompted her visit to the emergency room. She also reports that she was having intermittent difficulty finding words but no unilateral weakness or numbness of her extremities or gait instability. She is a smoker. She has a family history of stroke. Patient currently endorses her headaches is 6. She denies chest pain, shortness of breath, abdominal pain, vomiting, diarrhea, dysuria, fever, neck stiffness, rash, tick bites. She does endorse nausea. No jaw claudication, no phonophobia or photophobia.   History reviewed. No pertinent past medical history.  There are no active problems to display for this patient.   History reviewed. No pertinent surgical history.  Prior to Admission medications   Medication Sig Start Date End Date Taking? Authorizing Provider  ondansetron (ZOFRAN ODT) 4 MG disintegrating tablet Take 1 tablet (4 mg total) by mouth every 6 (six) hours as needed for nausea or vomiting. 10/30/15   Delman Kitten, MD    oseltamivir (TAMIFLU) 75 MG capsule Take 1 capsule (75 mg total) by mouth 2 (two) times daily. 10/30/15   Delman Kitten, MD    Allergies Review of patient's allergies indicates no known allergies.  History reviewed. No pertinent family history.  Social History Social History  Substance Use Topics  . Smoking status: Current Every Day Smoker  . Smokeless tobacco: Not on file  . Alcohol use No    Review of Systems  Constitutional: Negative for fever. Eyes: + blurry vision. ENT: Negative for sore throat. Cardiovascular: Negative for chest pain. Respiratory: Negative for shortness of breath. Gastrointestinal: Negative for abdominal pain, vomiting or diarrhea. Genitourinary: Negative for dysuria. Musculoskeletal: Negative for back pain. Skin: Negative for rash. Neurological: Negative for weakness or numbness. + HA, L facial numbness  ____________________________________________   PHYSICAL EXAM:  VITAL SIGNS: ED Triage Vitals  Enc Vitals Group     BP 06/26/16 1516 (!) 150/97     Pulse Rate 06/26/16 1510 84     Resp 06/26/16 1510 18     Temp 06/26/16 1518 98.4 F (36.9 C)     Temp Source 06/26/16 1518 Oral     SpO2 06/26/16 1510 100 %     Weight 06/26/16 1515 165 lb (74.8 kg)     Height 06/26/16 1515 5\' 7"  (1.702 m)     Head Circumference --      Peak Flow --      Pain Score 06/26/16 1512 6     Pain Loc --      Pain Edu? --      Excl. in [place]? --     Constitutional:  Alert and oriented. Well appearing and in no apparent distress. HEENT:      Head: Normocephalic and atraumatic.         Eyes: Conjunctivae are normal. Sclera is non-icteric. EOMI. PERRL      Mouth/Throat: Mucous membranes are moist.       Neck: Supple with no signs of meningismus. Cardiovascular: Regular rate and rhythm. No murmurs, gallops, or rubs. 2+ symmetrical distal pulses are present in all extremities. No JVD. Respiratory: Normal respiratory effort. Lungs are clear to auscultation bilaterally. No  wheezes, crackles, or rhonchi.  Gastrointestinal: Soft, non tender, and non distended with positive bowel sounds. No rebound or guarding. Musculoskeletal: Nontender with normal range of motion in all extremities. No edema, cyanosis, or erythema of extremities. Neurologic: Normal speech and language. A & O x3, PERRL, no nystagmus, CN II-XII intact, motor testing reveals good tone and bulk throughout. There is no evidence of pronator drift or dysmetria. Muscle strength is 5/5 throughout. Deep tendon reflexes are 2+ throughout with downgoing toes. Sensory examination is intact. Gait is normal. Skin: Skin is warm, dry and intact. No rash noted. Psychiatric: Mood and affect are normal. Speech and behavior are normal.  ____________________________________________   LABS (all labs ordered are listed, but only abnormal results are displayed)  Labs Reviewed  COMPREHENSIVE METABOLIC PANEL - Abnormal; Notable for the following:       Result Value   ALT 10 (*)    All other components within normal limits  CBC WITH DIFFERENTIAL/PLATELET  URINALYSIS COMPLETEWITH MICROSCOPIC ([facility] ONLY)   ____________________________________________  EKG  ED ECG REPORT I, Rudene Re, the attending physician, personally viewed and interpreted this ECG.  Normal sinus rhythm, rate of 75, normal intervals, normal axis, no ST elevations or depressions.  ____________________________________________  RADIOLOGY  Head Ct:  Normal CT head ____________________________________________   PROCEDURES  Procedure(s) performed: None Procedures Critical Care performed:  None ____________________________________________   INITIAL IMPRESSION / ASSESSMENT AND PLAN / ED COURSE  60 y.o. female no significant past medical history who presents for evaluation of a intermittent headache on and off since last night and new onset of L sided facial numbness, difficulty finding words and blurry vision at [facility]. All the symptoms  had already resolved upon arrival to the emergency room. Patient denies history of headaches and reports that this headache was very severe. She is completely neurologically intact at this time. Head CT is negative. Concerning for TIA. Low suspicion for SAH as HA was not thunderclap in quality, patient is neuro intact. Will discuss with hospitalist for admission for MRI.  Clinical Course    Pertinent labs & imaging results that were available during my care of the patient were reviewed by me and considered in my medical decision making (see chart for details).    ____________________________________________   FINAL CLINICAL IMPRESSION(S) / ED DIAGNOSES  Final diagnoses:  Transient cerebral ischemia, unspecified type      NEW MEDICATIONS STARTED DURING THIS VISIT:  New Prescriptions   No medications on file     Note:  This document was prepared using Dragon voice recognition software and may include unintentional dictation errors.    Rudene Re, MD 06/26/16 [phone]

## 2016-06-26 NOTE — H&P (Signed)
[place] [place] NAME: Annette Richard    MR#:  [id number]  DATE OF BIRTH:  Nov 27, 1955  DATE OF ADMISSION:  06/26/2016  PRIMARY CARE PHYSICIAN: Leamon Arnt, MD   REQUESTING/REFERRING PHYSICIAN: Gonzella Lex MD CHIEF COMPLAINT:   Chief Complaint  Patient presents with  . Headache    HISTORY OF PRESENT ILLNESS: Annette Richard  is a 60 y.o. female with a known history of  Hypertension, hyperlipidemia was currently not on medications states that these things have resolved. Who presents to the ED with complaining of having left sided headache that started yesterday evening. She reported that the headache was alternating between 6-8 in intensity. 2 some ibuprofen and her symptoms went away. Today around 2 PM she started noticing that she had some left-sided facial numbness and tingling. And also blurred vision in the right eye. Due to the symptoms she comes to the ER. Her symptoms are mostly resolved. Her headache is improved. She denies previous episode of similar symptoms. PAST MEDICAL HISTORY:   Past Medical History:  Diagnosis Date  . Arthritis     PAST SURGICAL HISTORY: Past Surgical History:  Procedure Laterality Date  . APPENDECTOMY    . appynedectomy    . CHOLECYSTECTOMY    . KNEE SURGERY      SOCIAL HISTORY:  Social History  Substance Use Topics  . Smoking status: Current Every Day Smoker  . Smokeless tobacco: Never Used  . Alcohol use No    FAMILY HISTORY:  Family History  Problem Relation Age of Onset  . CVA Maternal Grandfather     DRUG ALLERGIES: No Known Allergies  REVIEW OF SYSTEMS:   CONSTITUTIONAL: No fever, fatigue or weakness.  EYES: No blurred or double vision.  EARS, NOSE, AND THROAT: No tinnitus or ear pain.  RESPIRATORY: No cough, shortness of breath, wheezing or hemoptysis.  CARDIOVASCULAR: No chest pain, orthopnea, edema.  GASTROINTESTINAL: No nausea, vomiting, diarrhea or  abdominal pain.  GENITOURINARY: No dysuria, hematuria.  ENDOCRINE: No polyuria, nocturia,  HEMATOLOGY: No anemia, easy bruising or bleeding SKIN: No rash or lesion. MUSCULOSKELETAL: No joint pain or arthritis.   NEUROLOGIC: Positive for headache numbness of the left side of the face and right eye visual difficulties PSYCHIATRY: No anxiety or depression.   MEDICATIONS AT HOME:  Prior to Admission medications   Not on File      PHYSICAL EXAMINATION:   VITAL SIGNS: Blood pressure (!) 141/88, pulse 70, temperature 98.4 F (36.9 C), temperature source Oral, resp. rate 16, height 5\' 7"  (1.702 m), weight 165 lb (74.8 kg), SpO2 100 %.  GENERAL:  60 y.o.-year-old patient lying in the bed with no acute distress.  EYES: Pupils equal, round, reactive to light and accommodation. No scleral icterus. Extraocular muscles intact.  HEENT: Head atraumatic, normocephalic. Oropharynx and nasopharynx clear.  NECK:  Supple, no jugular venous distention. No thyroid enlargement, no tenderness.  LUNGS: Normal breath sounds bilaterally, no wheezing, rales,rhonchi or crepitation. No use of accessory muscles of respiration.  CARDIOVASCULAR: S1, S2 normal. No murmurs, rubs, or gallops.  ABDOMEN: Soft, nontender, nondistended. Bowel sounds present. No organomegaly or mass.  EXTREMITIES: No pedal edema, cyanosis, or clubbing.  NEUROLOGIC: Cranial nerves II through XII are intact. Muscle strength 5/5 in all extremities. Sensation intact. Gait not checked.  PSYCHIATRIC: The patient is alert and oriented x 3.  SKIN: No obvious rash, lesion, or ulcer.   LABORATORY PANEL:   CBC  Recent Labs Lab  06/26/16 1517  WBC 10.0  HGB 14.3  HCT 42.2  PLT 283  MCV 86.4  MCH 29.2  MCHC 33.8  RDW 13.2  LYMPHSABS 2.9  MONOABS 0.6  EOSABS 0.3  BASOSABS 0.1   ------------------------------------------------------------------------------------------------------------------  Chemistries   Recent Labs Lab  06/26/16 1517  NA 138  K 3.8  CL 107  CO2 23  GLUCOSE 99  BUN 11  CREATININE 0.93  CALCIUM 9.2  AST 15  ALT 10*  ALKPHOS 82  BILITOT 0.4   ------------------------------------------------------------------------------------------------------------------ estimated creatinine clearance is 69.6 mL/min (by C-G formula based on SCr of 0.93 mg/dL). ------------------------------------------------------------------------------------------------------------------ No results for input(s): TSH, T4TOTAL, T3FREE, THYROIDAB in the last 72 hours.  Invalid input(s): FREET3   Coagulation profile No results for input(s): INR, PROTIME in the last 168 hours. ------------------------------------------------------------------------------------------------------------------- No results for input(s): DDIMER in the last 72 hours. -------------------------------------------------------------------------------------------------------------------  Cardiac Enzymes No results for input(s): CKMB, TROPONINI, MYOGLOBIN in the last 168 hours.  Invalid input(s): CK ------------------------------------------------------------------------------------------------------------------ Invalid input(s): POCBNP  ---------------------------------------------------------------------------------------------------------------  Urinalysis    Component Value Date/Time   COLORURINE Straw 02/13/2014 1210   APPEARANCEUR Clear 02/13/2014 1210   LABSPEC 1.019 02/13/2014 1210   PHURINE 7.0 02/13/2014 1210   GLUCOSEU Negative 02/13/2014 1210   HGBUR 2+ 02/13/2014 1210   BILIRUBINUR Negative 02/13/2014 1210   KETONESUR Negative 02/13/2014 1210   PROTEINUR Negative 02/13/2014 1210   NITRITE Negative 02/13/2014 1210   LEUKOCYTESUR Negative 02/13/2014 1210     RADIOLOGY: Ct Head Wo Contrast  Result Date: 06/26/2016 CLINICAL DATA:  Headache and vision change EXAM: CT HEAD WITHOUT CONTRAST TECHNIQUE: Contiguous axial  images were obtained from the base of the skull through the vertex without intravenous contrast. COMPARISON:  None. FINDINGS: Brain: Ventricle size normal. Cerebral volume normal. Negative for infarct, mass, hemorrhage Vascular: No hyperdense vessel or unexpected calcification. Skull: Negative Sinuses/Orbits: Negative Other: None IMPRESSION: Normal CT head Electronically Signed   By: Franchot Gallo M.D.   On: 06/26/2016 15:44    EKG: Orders placed or performed during the hospital encounter of 06/26/16  . ED EKG  . ED EKG  . EKG 12-Lead  . EKG 12-Lead    IMPRESSION AND PLAN: Patient is a 60 year old white female with history of nicotine addiction presents with TIA like symptoms  1. TIA We'll place patient under observation Obtain MRI due to this persistent visual difficulties Obtain carotid Dopplers and echocardiogram of the heart Start patient on aspirin Monitor on telemetry  2. Nicotine addiction Will offer nicotine replacement encourage her to stop smoking  3. History of hyperlipidemia we'll check a fasting lipid panel in the morning  4. Miscellaneous Lovenox for DVT prophylaxis   All the records are reviewed and case discussed with ED provider. Management plans discussed with the patient, family and they are in agreement.  CODE STATUS:    Code Status Orders        Start     Ordered   06/26/16 1701  Full code  Continuous     06/26/16 1701    Code Status History    Date Active Date Inactive Code Status Order ID Comments User Context   This patient has a current code status but no historical code status.       TOTAL TIME TAKING CARE OF THIS PATIENT: 45minutes.    Dustin Flock M.D on 06/26/2016 at 5:11 PM  Between 7am to 6pm - Pager - [phone]  After 6pm go to [email or web address] - password [email or web address]  Office  [phone]  CC: Primary care physician; Leamon Arnt, MD

## 2016-06-26 NOTE — ED Triage Notes (Signed)
Pt c/o left sided headache that went away last night after motrin. She then started today at 1300 with left headache and facial tingling. Also at 1400 vision changes in right eye. No hx headaches. Reports worst headache of life.

## 2016-06-26 NOTE — ED Notes (Signed)
Patient transported to CT 

## 2016-06-27 ENCOUNTER — Observation Stay: Payer: BLUE CROSS/BLUE SHIELD

## 2016-06-27 LAB — BASIC METABOLIC PANEL
ANION GAP: 4 — AB (ref 5–15)
BUN: 11 mg/dL (ref 6–20)
CHLORIDE: 111 mmol/L (ref 101–111)
CO2: 26 mmol/L (ref 22–32)
Calcium: 8.4 mg/dL — ABNORMAL LOW (ref 8.9–10.3)
Creatinine, Ser: 0.91 mg/dL (ref 0.44–1.00)
GFR calc Af Amer: >60 mL/min (ref >60)
GFR calc non Af Amer: >60 mL/min (ref >60)
Glucose, Bld: 96 mg/dL (ref 65–99)
POTASSIUM: 4 mmol/L (ref 3.5–5.1)
SODIUM: 141 mmol/L (ref 135–145)

## 2016-06-27 LAB — CBC
HCT: 36.8 % (ref 35.0–47.0)
HEMOGLOBIN: 12.7 g/dL (ref 12.0–16.0)
MCH: 29.5 pg (ref 26.0–34.0)
MCHC: 34.5 g/dL (ref 32.0–36.0)
MCV: 85.5 fL (ref 80.0–100.0)
Platelets: 214 10*3/uL (ref 150–440)
RBC: 4.31 MIL/uL (ref 3.80–5.20)
RDW: 13.3 % (ref 11.5–14.5)
WBC: 8 10*3/uL (ref 3.6–11.0)

## 2016-06-27 LAB — URINALYSIS COMPLETE WITH MICROSCOPIC (ARMC ONLY)
BILIRUBIN URINE: NEGATIVE
GLUCOSE, UA: NEGATIVE mg/dL
Ketones, ur: NEGATIVE mg/dL
NITRITE: NEGATIVE
PH: 6 (ref 5.0–8.0)
Protein, ur: NEGATIVE mg/dL
SPECIFIC GRAVITY, URINE: 1.008 (ref 1.005–1.030)

## 2016-06-27 LAB — LIPID PANEL
CHOL/HDL RATIO: 4.9 ratio
CHOLESTEROL: 224 mg/dL — AB (ref 0–200)
HDL: 46 mg/dL (ref >40)
LDL Cholesterol: 155 mg/dL — ABNORMAL HIGH (ref 0–99)
TRIGLYCERIDES: 113 mg/dL (ref <150)
VLDL: 23 mg/dL (ref 0–40)

## 2016-06-27 LAB — ECHOCARDIOGRAM COMPLETE
Height: 66 in
Weight: 2542.4 oz

## 2016-06-27 MED ORDER — ATORVASTATIN CALCIUM 40 MG PO TABS: 40.0000 mg | ORAL_TABLET | Freq: Every day | ORAL | 2 refills | Status: DC

## 2016-06-27 MED ORDER — ASPIRIN EC 81 MG PO TBEC: 81.0000 mg | DELAYED_RELEASE_TABLET | Freq: Every day | ORAL | 2 refills | Status: DC

## 2016-06-27 NOTE — Discharge Summary (Signed)
[place] at [place] NAME: Annette Richard    MR#:  [id number]  DATE OF BIRTH:  [birth year]  DATE OF ADMISSION:  06/26/2016   ADMITTING PHYSICIAN: Dustin Flock, MD  DATE OF DISCHARGE: 06/27/2016  2:00 PM  PRIMARY CARE PHYSICIAN: ANDY,CAMILLE L, MD   ADMISSION DIAGNOSIS:   TIA (transient ischemic attack) [G45.9] Cerebral infarction ([place]) [I63.9] CVA (cerebral infarction) [I63.9] Transient cerebral ischemia, unspecified type [G45.9]  DISCHARGE DIAGNOSIS:   Active Problems:   TIA (transient ischemic attack)   SECONDARY DIAGNOSIS:   Past Medical History:  Diagnosis Date  . Arthritis     HOSPITAL COURSE:   60y/o F with PMH of hypertension, hyperlipidemia, tobacco use disorder admitted for right eye vision changes  #1 Right eye vision changes- less likely to be TIA, more like from ophthalmological issues - MRI with chronic microvascular changes, no acute infarct - carotid dopplers- no hemodynamically significant stenosis - ECHO normal -Low-dose aspirin at discharge  #2 hyperlipidemia-started on statin.  #3 tobacco use disorder-counseled on admission   Stable for discharge today  DISCHARGE CONDITIONS:   - stable  CONSULTS OBTAINED:   None  DRUG ALLERGIES:   No Known Allergies DISCHARGE MEDICATIONS:     Medication List    TAKE these medications   aspirin EC 81 MG tablet Take 1 tablet (81 mg total) by mouth daily.   atorvastatin 40 MG tablet Commonly known as:  LIPITOR Take 1 tablet (40 mg total) by mouth daily at 6 PM.        DISCHARGE INSTRUCTIONS:   1. PCP f/u in 1- weeks 2. Ophthalmology f/u in 1week  DIET:   Cardiac diet  ACTIVITY:   Activity as tolerated  OXYGEN:   Home Oxygen: No.  Oxygen Delivery: room air  DISCHARGE LOCATION:   home   If you experience worsening of your admission symptoms, develop shortness of breath, life threatening emergency, suicidal or homicidal  thoughts you must seek medical attention immediately by calling 911 or calling your MD immediately  if symptoms less severe.  You Must read complete instructions/literature along with all the possible adverse reactions/side effects for all the Medicines you take and that have been prescribed to you. Take any new Medicines after you have completely understood and accpet all the possible adverse reactions/side effects.   Please note  You were cared for by a hospitalist during your hospital stay. If you have any questions about your discharge medications or the care you received while you were in the hospital after you are discharged, you can call the unit and asked to speak with the hospitalist on call if the hospitalist that took care of you is not available. Once you are discharged, your primary care physician will handle any further medical issues. Please note that NO REFILLS for any discharge medications will be authorized once you are discharged, as it is imperative that you return to your primary care physician (or establish a relationship with a primary care physician if you do not have one) for your aftercare needs so that they can reassess your need for medications and monitor your lab values.    On the day of Discharge:  VITAL SIGNS:   Blood pressure (!) 143/88, pulse 69, temperature 98.1 F (36.7 C), temperature source Oral, resp. rate 18, height 5\' 6"  (1.676 m), weight 72.1 kg (158 lb 14.4 oz), SpO2 98 %.  PHYSICAL EXAMINATION:    GENERAL:  60 y.o.-year-old patient lying in the  bed with no acute distress.  EYES: Pupils equal, round, reactive to light and accommodation. No scleral icterus. Extraocular muscles intact.  HEENT: Head atraumatic, normocephalic. Oropharynx and nasopharynx clear.  NECK:  Supple, no jugular venous distention. No thyroid enlargement, no tenderness.  LUNGS: Normal breath sounds bilaterally, no wheezing, rales,rhonchi or crepitation. No use of accessory muscles  of respiration.  CARDIOVASCULAR: S1, S2 normal. No murmurs, rubs, or gallops.  ABDOMEN: Soft, non-tender, non-distended. Bowel sounds present. No organomegaly or mass.  EXTREMITIES: No pedal edema, cyanosis, or clubbing.  NEUROLOGIC: Cranial nerves II through XII are intact. Muscle strength 5/5 in all extremities. Sensation intact. Gait not checked.  PSYCHIATRIC: The patient is alert and oriented x 3.  SKIN: No obvious rash, lesion, or ulcer.   DATA REVIEW:   CBC  Recent Labs Lab 06/27/16 0522  WBC 8.0  HGB 12.7  HCT 36.8  PLT 214    Chemistries   Recent Labs Lab 06/26/16 1517 06/27/16 0522  NA 138 141  K 3.8 4.0  CL 107 111  CO2 23 26  GLUCOSE 99 96  BUN 11 11  CREATININE 0.93 0.91  CALCIUM 9.2 8.4*  AST 15  --   ALT 10*  --   ALKPHOS 82  --   BILITOT 0.4  --      Microbiology Results  Results for orders placed or performed during the hospital encounter of 10/30/15  Rapid Influenza A&B Antigens ([facility] only)     Status: None   Collection Time: 10/30/15  7:41 PM  Result Value Ref Range Status   Influenza A [facility]) NOT DETECTED  Final   Influenza B [facility]) NOT DETECTED  Final    RADIOLOGY:  Ct Head Wo Contrast  Result Date: 06/26/2016 CLINICAL DATA:  Headache and vision change EXAM: CT HEAD WITHOUT CONTRAST TECHNIQUE: Contiguous axial images were obtained from the base of the skull through the vertex without intravenous contrast. COMPARISON:  None. FINDINGS: Brain: Ventricle size normal. Cerebral volume normal. Negative for infarct, mass, hemorrhage Vascular: No hyperdense vessel or unexpected calcification. Skull: Negative Sinuses/Orbits: Negative Other: None IMPRESSION: Normal CT head Electronically Signed   By: Franchot Gallo M.D.   On: 06/26/2016 15:44   Mr Brain Wo Contrast  Result Date: 06/27/2016 CLINICAL DATA:  Cerebral infarction. Left-sided headache. Blurred vision. EXAM: MRI HEAD WITHOUT CONTRAST TECHNIQUE: Multiplanar, multiecho pulse sequences of  the brain and surrounding structures were obtained without intravenous contrast. COMPARISON:  CT head 06/26/2016 FINDINGS: Brain: Ventricle size normal.  Cerebral volume normal. Negative for acute infarct. Scattered small hyperintensities in the deep cerebral white matter on the left most consistent with microvascular ischemia. Brainstem and cerebellum normal. Basal ganglia normal. Negative for hemorrhage. Negative for mass or edema. No shift of the midline structures. Vascular: Normal flow voids. Skull and upper cervical spine: Negative Sinuses/Orbits: Negative Other: None IMPRESSION: No acute intracranial abnormality. Mild chronic white matter changes most consistent with microvascular ischemia. Electronically Signed   By: Franchot Gallo M.D.   On: 06/27/2016 11:08   US Carotid Bilateral  Result Date: 06/26/2016 CLINICAL DATA:  TIA EXAM: BILATERAL CAROTID DUPLEX ULTRASOUND TECHNIQUE: Pearline Cables scale imaging, color Doppler and duplex ultrasound were performed of bilateral carotid and vertebral arteries in the neck. COMPARISON:  None. FINDINGS: Criteria: Quantification of carotid stenosis is based on velocity parameters that correlate the residual internal carotid diameter with NASCET-based stenosis levels, using the diameter of the distal internal carotid lumen as the denominator for stenosis measurement. The following velocity measurements  were obtained: RIGHT ICA:  86 cm/sec CCA:  89 cm/sec SYSTOLIC ICA/CCA RATIO:  1.0 DIASTOLIC ICA/CCA RATIO:  2.0 ECA:  73 cm/sec LEFT ICA:  68 cm/sec CCA:  94 cm/sec SYSTOLIC ICA/CCA RATIO:  0.7 DIASTOLIC ICA/CCA RATIO:  1.5 ECA:  72 cm/sec RIGHT CAROTID ARTERY: Little if any plaque in the bulb. Low resistance internal carotid Doppler pattern. RIGHT VERTEBRAL ARTERY:  Antegrade. LEFT CAROTID ARTERY: Little if any plaque in the bulb. Low resistance internal carotid Doppler pattern. LEFT VERTEBRAL ARTERY:  Antegrade. IMPRESSION: Less than 50% stenosis in the right and left internal  carotid arteries. Electronically Signed   By: Marybelle Killings M.D.   On: 06/26/2016 17:49     Management plans discussed with the patient, family and they are in agreement.  CODE STATUS:     Code Status Orders        Start     Ordered   06/26/16 1701  Full code  Continuous     06/26/16 1701    Code Status History    Date Active Date Inactive Code Status Order ID Comments User Context   This patient has a current code status but no historical code status.      TOTAL TIME TAKING CARE OF THIS PATIENT: 38 minutes.    Gladstone Lighter M.D on 06/27/2016 at 2:39 PM  Between 7am to 6pm - Pager - [phone]  After 6pm go to [email or web address] - Proofreader  [facility]  Office  [phone]  CC: Primary care physician; Leamon Arnt, MD   Note: This dictation was prepared with Dragon dictation along with smaller phrase technology. Any transcriptional errors that result from this process are unintentional.

## 2016-06-27 NOTE — Progress Notes (Signed)
Pt has been discharged home. Discharge papers given and explained to pt. Pt verbalized understanding. Meds and f/u appointments reviewed with pt. Pt refused to be wheeled out in wheelchair.

## 2017-06-14 ENCOUNTER — Ambulatory Visit (HOSPITAL_COMMUNITY)
Admission: EM | Admit: 2017-06-14 | Discharge: 2017-06-14 | Disposition: A | Discharge status: Home / Self Care | Payer: Managed Care, Other (non HMO) | Attending: Internal Medicine | Admitting: Internal Medicine

## 2017-06-14 ENCOUNTER — Encounter (HOSPITAL_COMMUNITY): Payer: Self-pay | Admitting: Family Medicine

## 2017-06-14 ENCOUNTER — Ambulatory Visit (INDEPENDENT_AMBULATORY_CARE_PROVIDER_SITE_OTHER): Payer: Self-pay

## 2017-06-14 DIAGNOSIS — S50312A Abrasion of left elbow, initial encounter: Secondary | ICD-10-CM

## 2017-06-14 DIAGNOSIS — M25522 Pain in left elbow: Secondary | ICD-10-CM | POA: Diagnosis not present

## 2017-06-14 NOTE — Discharge Instructions (Signed)
X-ray negative for fracture or dislocation. Keep abrasion wound clean and dry. You can take ibuprofen 600 mg 3 times a day, or naproxen 440 mg twice day to help with pain and muscle soreness. You may experience increase in soreness the first 24-48 hours after accident, this is normal. Monitor for any worsening of symptoms, chest pain, shortness of breath, weakness, dizziness, fainting, abdominal pain, nausea, vomiting, bloody urine, blood in stool, follow-up the emergency department for further evaluation.
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
## 2018-02-04 ENCOUNTER — Other Ambulatory Visit: Payer: Self-pay

## 2018-02-04 ENCOUNTER — Encounter: Payer: Self-pay | Admitting: Podiatry

## 2018-02-04 ENCOUNTER — Ambulatory Visit (INDEPENDENT_AMBULATORY_CARE_PROVIDER_SITE_OTHER): Payer: Managed Care, Other (non HMO)

## 2018-02-04 ENCOUNTER — Ambulatory Visit (INDEPENDENT_AMBULATORY_CARE_PROVIDER_SITE_OTHER): Payer: Managed Care, Other (non HMO) | Admitting: Podiatry

## 2018-02-04 DIAGNOSIS — M722 Plantar fascial fibromatosis: Secondary | ICD-10-CM

## 2018-02-04 DIAGNOSIS — M792 Neuralgia and neuritis, unspecified: Secondary | ICD-10-CM

## 2018-02-04 DIAGNOSIS — M779 Enthesopathy, unspecified: Secondary | ICD-10-CM

## 2018-02-04 DIAGNOSIS — M79671 Pain in right foot: Secondary | ICD-10-CM | POA: Diagnosis not present

## 2018-02-04 DIAGNOSIS — M79672 Pain in left foot: Secondary | ICD-10-CM | POA: Diagnosis not present

## 2018-02-04 MED ORDER — METHYLPREDNISOLONE 4 MG PO TBPK: ORAL_TABLET | ORAL | 0 refills | Status: DC

## 2018-02-04 NOTE — Patient Instructions (Signed)

## 2018-02-06 NOTE — Progress Notes (Signed)
Subjective:   Patient ID: Annette Richard, female   DOB: 62 y.o.   MRN: [id number]   HPI 62 year old female presents the office today for concerns of bilateral foot pain.  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  He says the left foot is been hurting for over one year and otherwise has been hurting for about 6 months.  She states that she occasionally get some sharp pains into her toes the pain is in the bottom of the heel as well as in the arch of the foot.  She states that she previously seen Dr. Gershon Mussel for other foot issues.  She states that she gets pain to her feet and she stands a lot.  Denies any recent injury or trauma.  She has no other concerns today.   Review of Systems  All other systems reviewed and are negative.  Past Medical History:  Diagnosis Date  . Arthritis     Past Surgical History:  Procedure Laterality Date  . APPENDECTOMY    . appynedectomy    . CHOLECYSTECTOMY    . KNEE SURGERY       Current Outpatient Medications:  .  Ibuprofen-Famotidine 800-26.6 MG TABS, Take by mouth., Disp: , Rfl:  .  methylPREDNISolone (MEDROL DOSEPAK) 4 MG TBPK tablet, Take as directed, Disp: 21 tablet, Rfl: 0  No Known Allergies         Objective:  Physical Exam  General: AAO x3, NAD  Dermatological: Skin is warm, dry and supple bilateral. Nails x 10 are well manicured; remaining integument appears unremarkable at this time. There are no open sores, no preulcerative lesions, no rash or signs of infection present.  Vascular: Dorsalis Pedis artery and Posterior Tibial artery pedal pulses are 2/4 bilateral with immedate capillary fill time. There is no pain with calf compression, swelling, warmth, erythema.   Neruologic: Sensation is decreased to the left foot on the dorsal aspect with Derrel Nip monofilament.  Otherwise sensation appears to be intact.  Musculoskeletal: Tenderness to palpation along the plantar medial tubercle of the calcaneus  at the insertion of plantar fascia on the left and right foot. There is pain along the course of the plantar fascia within the arch of the foot. Plantar fascia appears to be intact. There is no pain with lateral compression of the calcaneus or pain with vibratory sensation. There is no pain along the course or insertion of the achilles tendon. No other areas of tenderness to bilateral lower extremities.  Muscle strength appears to be decreased in dorsiflexion of the left ankle.  Subjectively she does state that she does drag her foot at times.. Decreased range of motion of the first MTPJ Gait: Unassisted, Nonantalgic.       Assessment:   Bilateral foot pain, plantar fasciitis with concern for dropfoot on the left side.     Plan:      -Treatment options discussed including all alternatives, risks, and complications -Etiology of symptoms were discussed -X-rays were obtained and reviewed with the patient.  There is no evidence of acute fracture or stress fracture identified today. -Steroid injection performed.  See procedure note below. -Medrol Dosepak was prescribed. -Plantar fascial brace. -Discussed shoe modifications and orthotics -I am concerned about the drop foot on the left side.  She has decreased sensation with Derrel Nip monofilament also decreased ankle strength.  Because this, ordered nerve conduction test.  Procedure: Injection Tendon/Ligament Discussed alternatives, risks, complications and verbal  consent was obtained.  Location: Bilateral plantar fascia at the glabrous junction; medial approach. Skin Prep: Alcohol. Injectate: 0.5cc 0.5% marcaine plain, 0.5 cc 2% lidocaine plain and, 1 cc kenalog 10. Disposition: Patient tolerated procedure well. Injection site dressed with a band-aid.  Post-injection care was discussed and return precautions discussed.    Trula Slade DPM

## 2018-02-07 ENCOUNTER — Other Ambulatory Visit: Payer: Self-pay | Admitting: *Deleted

## 2018-02-07 ENCOUNTER — Telehealth: Payer: Self-pay | Admitting: *Deleted

## 2018-02-07 DIAGNOSIS — M21372 Foot drop, left foot: Secondary | ICD-10-CM

## 2018-02-07 NOTE — Telephone Encounter (Signed)
-----   Message from Trula Slade, DPM sent at 02/06/2018  7:13 PM EDT ----- Can you please order nerve conduction test.  Concern for dropfoot on the left side given decreased sensation as well as decreased dorsiflexion to the ankle.

## 2018-02-07 NOTE — Telephone Encounter (Signed)
Faxed orders to [facility]. 

## 2018-02-11 ENCOUNTER — Ambulatory Visit (INDEPENDENT_AMBULATORY_CARE_PROVIDER_SITE_OTHER): Payer: Managed Care, Other (non HMO) | Admitting: Neurology

## 2018-02-11 DIAGNOSIS — M21372 Foot drop, left foot: Secondary | ICD-10-CM

## 2018-02-11 NOTE — Procedures (Signed)
[facility]  [address]  [place], [place] [address] Tel: [phone] Fax:  [phone] Test Date:  02/11/2018  Patient: Annette Richard DOB: [birth year] Physician: Narda Amber, DO  Sex: Female Height: ' 0" Ref Phys: Celesta Gentile, DPM  ID#: [id number] Temp: 35.3C Technician:    Patient Complaints: This is a 62 year old female referred for evaluation of left foot drop.  NCV & EMG Findings: Extensive electrodiagnostic testing of the left lower extremity shows:  1. Left sural and superficial peroneal sensory responses are within normal limits. 2. Left peroneal and tibial motor responses are within normal limits. 3. Left tibial H reflex study is within normal limits. 4. There is no evidence of active or chronic motor axon loss changes affecting any of the tested muscles. Motor unit configuration and recruitment pattern is within normal limits.  Impression: This is a normal study of the left upper extremity. In particular, there is no evidence of a lumbosacral radiculopathy, peroneal mononeuropathy, or sensorimotor polyneuropathy.   ___________________________ Narda Amber, DO    Nerve Conduction Studies Anti Sensory Summary Table   Site NR Peak (ms) Norm Peak (ms) P-T Amp (V) Norm P-T Amp  Left Sup Peroneal Anti Sensory (Ant Lat Mall)  35.3C  12 cm    2.5 <4.6 5.1 >3  Left Sural Anti Sensory (Lat Mall)  35.3C  Calf    3.0 <4.6 8.1 >3   Motor Summary Table   Site NR Onset (ms) Norm Onset (ms) O-P Amp (mV) Norm O-P Amp Site1 Site2 Delta-0 (ms) Dist (cm) Vel (m/s) Norm Vel (m/s)  Left Peroneal Motor (Ext Dig Brev)  35.3C  Ankle    4.4 <6.0 2.8 >2.5 B Fib Ankle 7.2 37.0 51 >40  B Fib    11.6  2.7  Poplt B Fib 1.5 9.0 60 >40  Poplt    13.1  2.6         Left Peroneal TA Motor (Tib Ant)  35.3C  Fib Head    3.8 <4.5 5.7 >3 Poplit Fib Head 1.3 9.0 69 >40  Poplit    5.1  5.7         Left Tibial Motor (Abd Hall Brev)  35.3C  Ankle    3.4 <6.0 7.4  >4 Knee Ankle 8.8 40.0 45 >40  Knee    12.2  6.3          H Reflex Studies   NR H-Lat (ms) Lat Norm (ms) L-R H-Lat (ms)  Left Tibial (Gastroc)  35.3C     34.69 <35    EMG   Side Muscle Ins Act Fibs Psw Fasc Number Recrt Dur Dur. Amp Amp. Poly Poly. Comment  Left AntTibialis Nml Nml Nml Nml Nml Nml Nml Nml Nml Nml Nml Nml N/A  Left Gastroc Nml Nml Nml Nml Nml Nml Nml Nml Nml Nml Nml Nml N/A  Left Flex Dig Long Nml Nml Nml Nml Nml Nml Nml Nml Nml Nml Nml Nml N/A  Left RectFemoris Nml Nml Nml Nml Nml Nml Nml Nml Nml Nml Nml Nml N/A  Left GluteusMed Nml Nml Nml Nml Nml Nml Nml Nml Nml Nml Nml Nml N/A      Waveforms:

## 2018-02-18 ENCOUNTER — Ambulatory Visit (INDEPENDENT_AMBULATORY_CARE_PROVIDER_SITE_OTHER): Payer: Managed Care, Other (non HMO) | Admitting: Podiatry

## 2018-02-18 ENCOUNTER — Encounter: Payer: Self-pay | Admitting: Podiatry

## 2018-02-18 DIAGNOSIS — M722 Plantar fascial fibromatosis: Secondary | ICD-10-CM | POA: Diagnosis not present

## 2018-02-18 DIAGNOSIS — M779 Enthesopathy, unspecified: Secondary | ICD-10-CM

## 2018-02-18 MED ORDER — TRIAMCINOLONE ACETONIDE 10 MG/ML IJ SUSP: 10.0000 mg | Freq: Once | INTRAMUSCULAR | Status: DC

## 2018-02-19 DIAGNOSIS — M722 Plantar fascial fibromatosis: Secondary | ICD-10-CM | POA: Insufficient documentation

## 2018-02-19 NOTE — Progress Notes (Signed)
Subjective: 62 year old female presents the office for follow-up evaluation of bilateral heel pain.  States that the left foot still hurts however the right foot is been doing better.  The brace is been helping.  The swelling has improved.  Medications in the injection did help.  She states that she still gets discomfort to her feet and she thinks a lot of this has to do with her job in the hours that she is on her feet.  She denies any recent injury or trauma.  Also since I saw her she did have a nerve conduction test. Denies any systemic complaints such as fevers, chills, nausea, vomiting. No acute changes since last appointment, and no other complaints at this time.   Objective: AAO x3, NAD DP/PT pulses palpable bilaterally, CRT less than 3 seconds There is continuation of tenderness on the plantar medial tubercle of the calcaneus at the insertion of the plantar fascia on the left side.  Minimal discomfort on the right side and the patient was resolved.  On the left side there is also discomfort on the peroneal as well as the flexor tendons although minimal.  There is no other areas of tenderness identified this time there is no area pinpoint bony tenderness.  There is no significant edema today.  Overall her strength appears to be more adequate her strength is improved on her left side.  Overall her symptoms have improved. No open lesions or pre-ulcerative lesions.  No pain with calf compression, swelling, warmth, erythema  Assessment: Bilateral heel pain likely plantar fasciitis with resultant compensation causing tendinitis.  Plan: -All treatment options discussed with the patient including all alternatives, risks, complications.  -Discussed another steroid injection to the left foot she wishes to proceed.  See procedure note below.  On her continue with stretching, icing exercises daily.  She has been taking ibuprofen which is been helping but I want her to limit this given her medical history.   Continue with supportive shoes and also discussed orthotics.  We also discussed other treatment options including physical therapy, EPAT -Reviewed the nerve conduction test with her. -Patient encouraged to call the office with any questions, concerns, change in symptoms.   Procedure: Injection Tendon/Ligament Discussed alternatives, risks, complications and verbal consent was obtained.  Location: Left plantar fascia at the glabrous junction; medial approach. Skin Prep: Alcohol. Injectate: 0.5cc 0.5% marcaine plain, 0.5 cc 2% lidocaine plain and, 1 cc kenalog 10. Disposition: Patient tolerated procedure well. Injection site dressed with a band-aid.  Post-injection care was discussed and return precautions discussed.   Return in about 3 weeks (around 03/11/2018).  Trula Slade DPM

## 2018-02-20 ENCOUNTER — Telehealth: Payer: Self-pay | Admitting: Podiatry

## 2018-02-20 NOTE — Telephone Encounter (Signed)
Left message for pt to call to discuss orthotic coverage. 

## 2018-03-11 ENCOUNTER — Ambulatory Visit: Payer: Managed Care, Other (non HMO) | Admitting: Podiatry

## 2019-08-25 ENCOUNTER — Emergency Department
Admission: EM | Admit: 2019-08-25 | Discharge: 2019-08-25 | Disposition: A | Discharge status: Home / Self Care | Payer: PRIVATE HEALTH INSURANCE | Attending: Emergency Medicine | Admitting: Emergency Medicine

## 2019-08-25 ENCOUNTER — Encounter: Payer: Self-pay | Admitting: Emergency Medicine

## 2019-08-25 ENCOUNTER — Emergency Department: Payer: PRIVATE HEALTH INSURANCE

## 2019-08-25 ENCOUNTER — Other Ambulatory Visit: Payer: Self-pay

## 2019-08-25 DIAGNOSIS — I10 Essential (primary) hypertension: Secondary | ICD-10-CM | POA: Diagnosis not present

## 2019-08-25 DIAGNOSIS — Z20828 Contact with and (suspected) exposure to other viral communicable diseases: Secondary | ICD-10-CM

## 2019-08-25 DIAGNOSIS — U071 COVID-19: Secondary | ICD-10-CM | POA: Insufficient documentation

## 2019-08-25 DIAGNOSIS — M791 Myalgia, unspecified site: Secondary | ICD-10-CM | POA: Diagnosis present

## 2019-08-25 DIAGNOSIS — B349 Viral infection, unspecified: Secondary | ICD-10-CM

## 2019-08-25 DIAGNOSIS — F17210 Nicotine dependence, cigarettes, uncomplicated: Secondary | ICD-10-CM | POA: Diagnosis not present

## 2019-08-25 DIAGNOSIS — Z20822 Contact with and (suspected) exposure to covid-19: Secondary | ICD-10-CM

## 2019-08-25 LAB — INFLUENZA PANEL BY PCR (TYPE A & B)
Influenza A By PCR: NEGATIVE
Influenza B By PCR: NEGATIVE

## 2019-08-25 LAB — SARS CORONAVIRUS 2 (TAT 6-24 HRS): SARS Coronavirus 2: POSITIVE — AB

## 2019-08-25 NOTE — ED Provider Notes (Addendum)
[facility] Emergency Department Provider Note  ____________________________________________   First MD Initiated Contact with Patient 08/25/19 0802     (approximate)  I have reviewed the triage vital signs and the nursing notes.   HISTORY  Chief Complaint Fever, Generalized Body Aches, Cough, and Chills   HPI Annette Richard is a 63 y.o. female presents to the ED with complaint of body aches, nausea, cough, fever, chills.  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  Patient states that she is rapid tested on a weekly basis.  She states that yesterday's test was negative.  She reports intermittent fevers from 99-100.4.  Patient denies any difficulty breathing, history of asthma or bronchitis.  Patient has continued to smoke with the symptoms.  She rates her pain as 3 out of 10.       Past Medical History:  Diagnosis Date  . Arthritis     Patient Active Problem List   Diagnosis Date Noted  . Plantar fasciitis 02/19/2018  . TIA (transient ischemic attack) 06/26/2016  . Mixed hyperlipidemia 03/25/2015  . HTN (hypertension), benign 03/24/2015  . GERD (gastroesophageal reflux disease) 05/12/2011  . Environmental allergies 05/12/2011    Past Surgical History:  Procedure Laterality Date  . APPENDECTOMY    . appynedectomy    . CHOLECYSTECTOMY    . KNEE SURGERY      Prior to Admission medications   Medication Sig Start Date End Date Taking? Authorizing Provider  Ibuprofen-Famotidine 800-26.6 MG TABS Take by mouth.    [provider]    Allergies Patient has no known allergies.  Family History  Problem Relation Age of Onset  . CVA Maternal Grandfather     Social History Social History   Tobacco Use  . Smoking status: Current Every Day Smoker    Packs/day: 1.00    Years: 15.00    Pack years: 15.00  . Smokeless tobacco: Never Used   Substance Use Topics  . Alcohol use: No  . Drug use: No    Review of Systems Constitutional: Positive fever/chills Eyes: No visual changes. ENT: No sore throat.  Positive nasal congestion. Cardiovascular: Denies chest pain. Respiratory: Denies shortness of breath.  Has of her cough. Gastrointestinal: No abdominal pain.  Positive nausea, no vomiting.  No diarrhea.  No constipation. Genitourinary: Negative for dysuria. Musculoskeletal: Positive for muscle aches. Skin: Negative for rash. Neurological: Negative for headaches, focal weakness or numbness. ____________________________________________   PHYSICAL EXAM:  VITAL SIGNS: ED Triage Vitals [08/25/19 0752]  Enc Vitals Group     BP (!) 165/96     Pulse Rate 77     Resp 18     Temp 98.3 F (36.8 C)     Temp Source Oral     SpO2 98 %     Weight 170 lb (77.1 kg)     Height 5\' 7"  (1.702 m)     Head Circumference      Peak Flow      Pain Score 3     Pain Loc      Pain Edu?      Excl. in [place]?    Constitutional: Alert and oriented. Well appearing and in no acute distress.  Talkative, nontoxic in appearance. Eyes: Conjunctivae are normal.  Head: Atraumatic. Nose: Mild congestion/no present rhinnorhea. Mouth/Throat: Mucous membranes are moist.  Oropharynx non-erythematous. Neck: No stridor.   Cardiovascular:  Normal rate, regular rhythm. Grossly normal heart sounds.  Good peripheral circulation. Respiratory: Normal respiratory effort.  No retractions. Lungs CTAB. Gastrointestinal: Soft and nontender. No distention.  Musculoskeletal: Moves upper and lower extremities with any difficulty.  Normal gait was noted. Neurologic:  Normal speech and language. No gross focal neurologic deficits are appreciated. No gait instability. Skin:  Skin is warm, dry and intact. No rash noted. Psychiatric: Mood and affect are normal. Speech and behavior are normal.  ____________________________________________   LABS (all labs ordered are  listed, but only abnormal results are displayed)  Labs Reviewed  SARS CORONAVIRUS 2 (TAT 6-24 HRS)  INFLUENZA PANEL BY PCR (TYPE A & B)   RADIOLOGY  Official radiology report(s): Dg Chest Portable 1 View  Result Date: 08/25/2019 CLINICAL DATA:  Cough and fever with COVID exposure EXAM: PORTABLE CHEST 1 VIEW COMPARISON:  10/30/2015 FINDINGS: Interstitial coarsening similar to prior. There is no edema, consolidation, effusion, or pneumothorax. Normal heart size and stable mediastinal contours. IMPRESSION: Stable compared to 2017.  No focal pneumonia. Electronically Signed   By: Monte Fantasia M.D.   On: 08/25/2019 08:39    ____________________________________________   PROCEDURES  Procedure(s) performed (including Critical Care):  Procedures   ____________________________________________   INITIAL IMPRESSION / ASSESSMENT AND PLAN / ED COURSE  As part of my medical decision making, I reviewed the following data within the electronic MEDICAL RECORD NUMBER Notes from prior ED visits and [place] Controlled Substance Database  Annette Richard was evaluated in Emergency Department on 08/25/2019 for the symptoms described in the history of present illness. She was evaluated in the context of the global COVID-19 pandemic, which necessitated consideration that the patient might be at risk for infection with the SARS-CoV-2 virus that causes COVID-19. Institutional protocols and algorithms that pertain to the evaluation of patients at risk for COVID-19 are in a state of rapid change based on information released by regulatory bodies including the CDC and federal and state organizations. These policies and algorithms were followed during the patient's care in the ED.  63 year old female presents to the ED with multiple complaints consistent with influenza versus Covid.  Patient states that 5 days ago she began with body aches, nausea, fever, cough and chills.  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  She states that they have a rapid Covid test done weekly and they have been negative.  Chest x-ray was unremarkable.  She was negative for influenza a and B.  A Covid test was done and patient is instructed to quarantine until she hears the results.  Patient will continue to stay hydrated, Tylenol or ibuprofen and should her test result as positive she is aware that she needs to quarantine for an additional 10 days.   ____________________________________________   FINAL CLINICAL IMPRESSION(S) / ED DIAGNOSES  Final diagnoses:  Viral illness  Exposure to COVID-19 virus     ED Discharge Orders    None       Note:  This document was prepared using Dragon voice recognition software and may include unintentional dictation errors.    Johnn Hai, PA-C 08/25/19 1348    Johnn Hai, PA-C 08/25/19 1355    Harvest Dark, MD 08/25/19 1357

## 2019-08-25 NOTE — ED Triage Notes (Signed)
Pt reports feels like she has the flu and her whole body hurts.

## 2019-08-25 NOTE — Discharge Instructions (Signed)
Follow-up with your primary care provider if any continued problems.  Return to the emergency department if any severe worsening of your symptoms or difficulty breathing.  Currently her O2 sat is 98%.  Decrease or discontinue smoking if at all possible.  Increase fluids.  Tylenol or ibuprofen as needed for fever and body aches.  Quarantine until you have received the results of your Covid test.  If this is positive you will need to quarantine an additional 10 days.

## 2019-08-25 NOTE — ED Notes (Signed)
See triage note  Presents with body aches with some nausea.  States sx's started last Thursday  States she works in a [employer] tested weekly  Yesterdays test was neg  Has had intermittent fever (99 - 100.4)  Afebrile

## 2019-08-25 NOTE — ED Triage Notes (Signed)
Pt in via EMS from home with c/o body aches, chills, fever, cough, etc. EMS reports pt was tested recently for COVID and it was negative. Pt states doesn't feel well and wanted to be checked out.

## 2019-08-26 ENCOUNTER — Telehealth: Payer: Self-pay | Admitting: Unknown Physician Specialty

## 2019-08-26 NOTE — Telephone Encounter (Signed)
Called to discuss with patient about Covid symptoms and the use of bamlanivimab, a monoclonal antibody infusion for those with mild to moderate Covid symptoms and at a high risk of hospitalization.  Pt is qualified for this infusion at the [facility] infusion center due to hypertension  Left message to call back

## 2019-08-26 NOTE — Telephone Encounter (Signed)
Call in error

## 2019-08-27 ENCOUNTER — Other Ambulatory Visit: Payer: Self-pay | Admitting: Unknown Physician Specialty

## 2019-08-27 ENCOUNTER — Telehealth: Payer: Self-pay | Admitting: Unknown Physician Specialty

## 2019-08-27 DIAGNOSIS — U071 COVID-19: Secondary | ICD-10-CM

## 2019-08-27 NOTE — Telephone Encounter (Signed)
Body aches, congestion, and fever for 7 days.  Discussed with patient about Covid symptoms and the use of bamlanivimab, a monoclonal antibody infusion for those with mild to moderate Covid symptoms and at a high risk of hospitalization.  Pt is qualified for this infusion at the [facility] infusion center due to Age >55 and Hypertension which were addressed with the patient and are actively being managed by her PCP   After discussing the infusion's costs, potential benefits and side effects, the patient has decided to accept treatment with monoclonal antibodies.

## 2019-08-28 ENCOUNTER — Ambulatory Visit (HOSPITAL_COMMUNITY)
Admission: RE | Admit: 2019-08-28 | Discharge: 2019-08-28 | Disposition: A | Discharge status: Home / Self Care | Payer: No Typology Code available for payment source | Source: Ambulatory Visit | Attending: Pulmonary Disease | Admitting: Pulmonary Disease

## 2019-08-28 DIAGNOSIS — U071 COVID-19: Secondary | ICD-10-CM | POA: Diagnosis present

## 2019-08-28 MED ORDER — SODIUM CHLORIDE 0.9 % IV SOLN
700.0000 mg | Freq: Once | INTRAVENOUS | Status: AC
  Administered 2019-08-28: 700 mg via INTRAVENOUS
  Filled 2019-08-28: qty 20

## 2019-08-28 MED ORDER — FAMOTIDINE IN NACL 20-0.9 MG/50ML-% IV SOLN: 20.0000 mg | Freq: Once | INTRAVENOUS | Status: DC | PRN

## 2019-08-28 MED ORDER — SODIUM CHLORIDE 0.9 % IV SOLN: INTRAVENOUS | Status: DC | PRN

## 2019-08-28 MED ORDER — DIPHENHYDRAMINE HCL 50 MG/ML IJ SOLN: 50.0000 mg | Freq: Once | INTRAMUSCULAR | Status: DC | PRN

## 2019-08-28 MED ORDER — EPINEPHRINE 0.3 MG/0.3ML IJ SOAJ: 0.3000 mg | Freq: Once | INTRAMUSCULAR | Status: DC | PRN

## 2019-08-28 MED ORDER — ALBUTEROL SULFATE HFA 108 (90 BASE) MCG/ACT IN AERS: 2.0000 | INHALATION_SPRAY | Freq: Once | RESPIRATORY_TRACT | Status: DC | PRN

## 2019-08-28 MED ORDER — METHYLPREDNISOLONE SODIUM SUCC 125 MG IJ SOLR: 125.0000 mg | Freq: Once | INTRAMUSCULAR | Status: DC | PRN

## 2019-08-28 NOTE — Progress Notes (Signed)
.   Diagnosis: COVID-19  Physician: Dr. Joya Gaskins  Procedure: Covid Infusion Clinic Med: bamlanivimab infusion - Provided patient with bamlanimivab fact sheet for patients, parents and caregivers prior to infusion.  Complications: No immediate complications noted.  Discharge: Discharged home   Babs Sciara 08/28/2019

## 2019-11-27 ENCOUNTER — Ambulatory Visit: Payer: No Typology Code available for payment source | Attending: Internal Medicine

## 2019-11-27 DIAGNOSIS — Z23 Encounter for immunization: Secondary | ICD-10-CM

## 2019-11-27 NOTE — Progress Notes (Signed)
   Covid-19 Vaccination Clinic  Name:  Annette Richard    MRN: [id number] DOB: 1956-08-12  11/27/2019  Annette Richard was observed post Covid-19 immunization for 15 minutes without incident. She was provided with Vaccine Information Sheet and instruction to access the V-Safe system.   Annette Richard was instructed to call 911 with any severe reactions post vaccine: Marland Kitchen Difficulty breathing  . Swelling of face and throat  . A fast heartbeat  . A bad rash all over body  . Dizziness and weakness   Immunizations Administered    Name Date Dose VIS Date Route   Moderna COVID-19 Vaccine 11/27/2019  9:45 AM 0.5 mL 08/18/2019 Intramuscular   Manufacturer: Moderna   Lot: [id number]   [place]VO:[id number]

## 2019-12-30 ENCOUNTER — Ambulatory Visit: Payer: No Typology Code available for payment source | Attending: Internal Medicine

## 2019-12-30 DIAGNOSIS — Z23 Encounter for immunization: Secondary | ICD-10-CM

## 2019-12-30 NOTE — Progress Notes (Signed)
2  Covid-19 Vaccination Clinic  Name:  Annette Richard    MRN: [id number] DOB: 10-22-1955  12/30/2019  Annette Richard was observed post Covid-19 immunization for 15 minutes without incident. She was provided with Vaccine Information Sheet and instruction to access the V-Safe system.   Annette Richard was instructed to call 911 with any severe reactions post vaccine: Marland Kitchen Difficulty breathing  . Swelling of face and throat  . A fast heartbeat  . A bad rash all over body  . Dizziness and weakness   Immunizations Administered    Name Date Dose VIS Date Route   Moderna COVID-19 Vaccine 12/30/2019  9:49 AM 0.5 mL 08/18/2019 Intramuscular   Manufacturer: Moderna   Lot: [id number]   [id number]

## 2020-04-25 ENCOUNTER — Other Ambulatory Visit: Payer: Self-pay | Admitting: Sports Medicine

## 2020-04-25 DIAGNOSIS — M7551 Bursitis of right shoulder: Secondary | ICD-10-CM

## 2020-04-25 DIAGNOSIS — M19011 Primary osteoarthritis, right shoulder: Secondary | ICD-10-CM

## 2020-04-25 DIAGNOSIS — M7541 Impingement syndrome of right shoulder: Secondary | ICD-10-CM

## 2020-04-25 DIAGNOSIS — M25511 Pain in right shoulder: Secondary | ICD-10-CM

## 2020-05-14 ENCOUNTER — Other Ambulatory Visit: Payer: Self-pay

## 2020-05-14 ENCOUNTER — Ambulatory Visit
Admission: RE | Admit: 2020-05-14 | Discharge: 2020-05-14 | Disposition: A | Discharge status: Home / Self Care | Payer: 59 | Source: Ambulatory Visit | Attending: Sports Medicine | Admitting: Sports Medicine

## 2020-05-14 DIAGNOSIS — M7541 Impingement syndrome of right shoulder: Secondary | ICD-10-CM | POA: Diagnosis present

## 2020-05-14 DIAGNOSIS — M7551 Bursitis of right shoulder: Secondary | ICD-10-CM

## 2020-05-14 DIAGNOSIS — M25511 Pain in right shoulder: Secondary | ICD-10-CM

## 2020-05-14 DIAGNOSIS — M19011 Primary osteoarthritis, right shoulder: Secondary | ICD-10-CM | POA: Diagnosis present

## 2021-07-10 ENCOUNTER — Emergency Department (HOSPITAL_COMMUNITY): Payer: 59

## 2021-07-10 ENCOUNTER — Emergency Department (HOSPITAL_COMMUNITY): Payer: 59 | Admitting: Anesthesiology

## 2021-07-10 ENCOUNTER — Encounter (HOSPITAL_COMMUNITY): Admission: EM | Disposition: A | Discharge status: Home / Self Care | Payer: Self-pay | Source: Home / Self Care

## 2021-07-10 ENCOUNTER — Inpatient Hospital Stay (HOSPITAL_COMMUNITY): Payer: 59

## 2021-07-10 ENCOUNTER — Other Ambulatory Visit: Payer: Self-pay

## 2021-07-10 ENCOUNTER — Inpatient Hospital Stay (HOSPITAL_COMMUNITY)
Admission: EM | Admit: 2021-07-10 | Discharge: 2021-07-14 | DRG: 956 | Disposition: A | Discharge status: Home / Self Care | Payer: 59 | Attending: Surgery | Admitting: Surgery

## 2021-07-10 ENCOUNTER — Encounter (HOSPITAL_COMMUNITY): Payer: Self-pay | Admitting: *Deleted

## 2021-07-10 DIAGNOSIS — N6320 Unspecified lump in the left breast, unspecified quadrant: Secondary | ICD-10-CM | POA: Diagnosis present

## 2021-07-10 DIAGNOSIS — R200 Anesthesia of skin: Secondary | ICD-10-CM | POA: Diagnosis present

## 2021-07-10 DIAGNOSIS — I998 Other disorder of circulatory system: Secondary | ICD-10-CM | POA: Diagnosis present

## 2021-07-10 DIAGNOSIS — Y92410 Unspecified street and highway as the place of occurrence of the external cause: Secondary | ICD-10-CM | POA: Diagnosis not present

## 2021-07-10 DIAGNOSIS — S36113A Laceration of liver, unspecified degree, initial encounter: Secondary | ICD-10-CM | POA: Diagnosis present

## 2021-07-10 DIAGNOSIS — Z23 Encounter for immunization: Secondary | ICD-10-CM | POA: Diagnosis not present

## 2021-07-10 DIAGNOSIS — Z803 Family history of malignant neoplasm of breast: Secondary | ICD-10-CM

## 2021-07-10 DIAGNOSIS — R739 Hyperglycemia, unspecified: Secondary | ICD-10-CM | POA: Diagnosis present

## 2021-07-10 DIAGNOSIS — S72142A Displaced intertrochanteric fracture of left femur, initial encounter for closed fracture: Secondary | ICD-10-CM | POA: Diagnosis present

## 2021-07-10 DIAGNOSIS — I951 Orthostatic hypotension: Secondary | ICD-10-CM | POA: Diagnosis not present

## 2021-07-10 DIAGNOSIS — S72112A Displaced fracture of greater trochanter of left femur, initial encounter for closed fracture: Secondary | ICD-10-CM | POA: Diagnosis not present

## 2021-07-10 DIAGNOSIS — D62 Acute posthemorrhagic anemia: Secondary | ICD-10-CM | POA: Diagnosis present

## 2021-07-10 DIAGNOSIS — S0101XA Laceration without foreign body of scalp, initial encounter: Secondary | ICD-10-CM | POA: Diagnosis present

## 2021-07-10 DIAGNOSIS — Z91120 Patient's intentional underdosing of medication regimen due to financial hardship: Secondary | ICD-10-CM | POA: Diagnosis not present

## 2021-07-10 DIAGNOSIS — Z419 Encounter for procedure for purposes other than remedying health state, unspecified: Secondary | ICD-10-CM

## 2021-07-10 DIAGNOSIS — I10 Essential (primary) hypertension: Secondary | ICD-10-CM | POA: Diagnosis present

## 2021-07-10 DIAGNOSIS — S81012A Laceration without foreign body, left knee, initial encounter: Secondary | ICD-10-CM | POA: Diagnosis present

## 2021-07-10 DIAGNOSIS — S329XXA Fracture of unspecified parts of lumbosacral spine and pelvis, initial encounter for closed fracture: Secondary | ICD-10-CM | POA: Diagnosis present

## 2021-07-10 DIAGNOSIS — S73035A Other anterior dislocation of left hip, initial encounter: Secondary | ICD-10-CM | POA: Diagnosis present

## 2021-07-10 DIAGNOSIS — F17210 Nicotine dependence, cigarettes, uncomplicated: Secondary | ICD-10-CM | POA: Diagnosis present

## 2021-07-10 DIAGNOSIS — S30810A Abrasion of lower back and pelvis, initial encounter: Secondary | ICD-10-CM | POA: Diagnosis present

## 2021-07-10 DIAGNOSIS — J9811 Atelectasis: Secondary | ICD-10-CM | POA: Diagnosis present

## 2021-07-10 DIAGNOSIS — S73002A Unspecified subluxation of left hip, initial encounter: Secondary | ICD-10-CM | POA: Diagnosis not present

## 2021-07-10 DIAGNOSIS — T50996A Underdosing of other drugs, medicaments and biological substances, initial encounter: Secondary | ICD-10-CM | POA: Diagnosis present

## 2021-07-10 DIAGNOSIS — W228XXA Striking against or struck by other objects, initial encounter: Secondary | ICD-10-CM | POA: Diagnosis present

## 2021-07-10 DIAGNOSIS — I959 Hypotension, unspecified: Secondary | ICD-10-CM | POA: Diagnosis present

## 2021-07-10 DIAGNOSIS — E8721 Acute metabolic acidosis: Secondary | ICD-10-CM | POA: Diagnosis present

## 2021-07-10 DIAGNOSIS — Z8781 Personal history of (healed) traumatic fracture: Secondary | ICD-10-CM

## 2021-07-10 DIAGNOSIS — S8992XA Unspecified injury of left lower leg, initial encounter: Secondary | ICD-10-CM | POA: Diagnosis not present

## 2021-07-10 DIAGNOSIS — T148XXA Other injury of unspecified body region, initial encounter: Secondary | ICD-10-CM

## 2021-07-10 DIAGNOSIS — Z720 Tobacco use: Secondary | ICD-10-CM | POA: Diagnosis not present

## 2021-07-10 DIAGNOSIS — R42 Dizziness and giddiness: Secondary | ICD-10-CM | POA: Diagnosis not present

## 2021-07-10 DIAGNOSIS — S0181XA Laceration without foreign body of other part of head, initial encounter: Secondary | ICD-10-CM | POA: Diagnosis present

## 2021-07-10 DIAGNOSIS — Z20822 Contact with and (suspected) exposure to covid-19: Secondary | ICD-10-CM | POA: Diagnosis present

## 2021-07-10 DIAGNOSIS — T1490XA Injury, unspecified, initial encounter: Secondary | ICD-10-CM

## 2021-07-10 HISTORY — PX: HIP CLOSED REDUCTION: SHX983

## 2021-07-10 HISTORY — PX: FACIAL LACERATION REPAIR: SHX6589

## 2021-07-10 LAB — I-STAT CHEM 8, ED
BUN: 10 mg/dL (ref 8–23)
Calcium, Ion: 1.07 mmol/L — ABNORMAL LOW (ref 1.15–1.40)
Chloride: 105 mmol/L (ref 98–111)
Creatinine, Ser: 1 mg/dL (ref 0.44–1.00)
Glucose, Bld: 205 mg/dL — ABNORMAL HIGH (ref 70–99)
HCT: 38 % (ref 36.0–46.0)
Hemoglobin: 12.9 g/dL (ref 12.0–15.0)
Potassium: 3.4 mmol/L — ABNORMAL LOW (ref 3.5–5.1)
Sodium: 139 mmol/L (ref 135–145)
TCO2: 22 mmol/L (ref 22–32)

## 2021-07-10 LAB — POCT I-STAT EG7
Acid-base deficit: 6 mmol/L — ABNORMAL HIGH (ref 0.0–2.0)
Bicarbonate: 23.1 mmol/L (ref 20.0–28.0)
Calcium, Ion: 1.13 mmol/L — ABNORMAL LOW (ref 1.15–1.40)
HCT: 39 % (ref 36.0–46.0)
Hemoglobin: 13.3 g/dL (ref 12.0–15.0)
O2 Saturation: 37 %
Potassium: 4.2 mmol/L (ref 3.5–5.1)
Sodium: 141 mmol/L (ref 135–145)
TCO2: 25 mmol/L (ref 22–32)
pCO2, Ven: 59.2 mmHg (ref 44.0–60.0)
pH, Ven: 7.2 — ABNORMAL LOW (ref 7.250–7.430)
pO2, Ven: 27 mmHg — CL (ref 32.0–45.0)

## 2021-07-10 LAB — RESP PANEL BY RT-PCR (FLU A&B, COVID) ARPGX2
Influenza A by PCR: NEGATIVE
Influenza B by PCR: NEGATIVE
SARS Coronavirus 2 by RT PCR: NEGATIVE

## 2021-07-10 LAB — ETHANOL: Alcohol, Ethyl (B): <10 mg/dL (ref <10)

## 2021-07-10 LAB — CBC
HCT: 39.7 % (ref 36.0–46.0)
Hemoglobin: 13 g/dL (ref 12.0–15.0)
MCH: 29.3 pg (ref 26.0–34.0)
MCHC: 32.7 g/dL (ref 30.0–36.0)
MCV: 89.6 fL (ref 80.0–100.0)
Platelets: 343 10*3/uL (ref 150–400)
RBC: 4.43 MIL/uL (ref 3.87–5.11)
RDW: 13.1 % (ref 11.5–15.5)
WBC: 12.1 10*3/uL — ABNORMAL HIGH (ref 4.0–10.5)
nRBC: 0 % (ref 0.0–0.2)

## 2021-07-10 LAB — COMPREHENSIVE METABOLIC PANEL
ALT: 14 U/L (ref 0–44)
AST: 22 U/L (ref 15–41)
Albumin: 3.5 g/dL (ref 3.5–5.0)
Alkaline Phosphatase: 94 U/L (ref 38–126)
Anion gap: 11 (ref 5–15)
BUN: 8 mg/dL (ref 8–23)
CO2: 21 mmol/L — ABNORMAL LOW (ref 22–32)
Calcium: 8.8 mg/dL — ABNORMAL LOW (ref 8.9–10.3)
Chloride: 104 mmol/L (ref 98–111)
Creatinine, Ser: 1.23 mg/dL — ABNORMAL HIGH (ref 0.44–1.00)
GFR, Estimated: 49 mL/min — ABNORMAL LOW (ref >60)
Glucose, Bld: 209 mg/dL — ABNORMAL HIGH (ref 70–99)
Potassium: 3.3 mmol/L — ABNORMAL LOW (ref 3.5–5.1)
Sodium: 136 mmol/L (ref 135–145)
Total Bilirubin: 0.6 mg/dL (ref 0.3–1.2)
Total Protein: 6.4 g/dL — ABNORMAL LOW (ref 6.5–8.1)

## 2021-07-10 LAB — ABO/RH: ABO/RH(D): O POS

## 2021-07-10 LAB — PROTIME-INR
INR: 1.4 — ABNORMAL HIGH (ref 0.8–1.2)
Prothrombin Time: 17.1 seconds — ABNORMAL HIGH (ref 11.4–15.2)

## 2021-07-10 LAB — GLUCOSE, CAPILLARY
Glucose-Capillary: 122 mg/dL — ABNORMAL HIGH (ref 70–99)
Glucose-Capillary: 126 mg/dL — ABNORMAL HIGH (ref 70–99)

## 2021-07-10 LAB — LACTIC ACID, PLASMA: Lactic Acid, Venous: 3.2 mmol/L (ref 0.5–1.9)

## 2021-07-10 LAB — VITAMIN D 25 HYDROXY (VIT D DEFICIENCY, FRACTURES): Vit D, 25-Hydroxy: 5.53 ng/mL — ABNORMAL LOW (ref 30–100)

## 2021-07-10 LAB — HIV ANTIBODY (ROUTINE TESTING W REFLEX): HIV Screen 4th Generation wRfx: NONREACTIVE

## 2021-07-10 SURGERY — CLOSED REDUCTION, HIP
Anesthesia: General | Site: Hip

## 2021-07-10 MED ORDER — MIDAZOLAM HCL 2 MG/2ML IJ SOLN
INTRAMUSCULAR | Status: DC | PRN
  Administered 2021-07-10: 2 mg via INTRAVENOUS

## 2021-07-10 MED ORDER — ROCURONIUM BROMIDE 10 MG/ML (PF) SYRINGE
PREFILLED_SYRINGE | INTRAVENOUS | Status: AC
  Filled 2021-07-10: qty 10

## 2021-07-10 MED ORDER — PROPOFOL 10 MG/ML IV BOLUS
INTRAVENOUS | Status: AC
  Filled 2021-07-10: qty 20

## 2021-07-10 MED ORDER — MIDAZOLAM HCL 2 MG/2ML IJ SOLN
INTRAMUSCULAR | Status: AC
  Filled 2021-07-10: qty 2

## 2021-07-10 MED ORDER — METHOCARBAMOL 750 MG PO TABS
750.0000 mg | ORAL_TABLET | Freq: Four times a day (QID) | ORAL | Status: DC | PRN
  Administered 2021-07-11 – 2021-07-13 (×6): 750 mg via ORAL
  Filled 2021-07-10 (×6): qty 1

## 2021-07-10 MED ORDER — DEXAMETHASONE SODIUM PHOSPHATE 10 MG/ML IJ SOLN
INTRAMUSCULAR | Status: DC | PRN
  Administered 2021-07-10: 4 mg via INTRAVENOUS

## 2021-07-10 MED ORDER — ONDANSETRON HCL 4 MG/2ML IJ SOLN
4.0000 mg | Freq: Once | INTRAMUSCULAR | Status: AC
  Administered 2021-07-10: 4 mg via INTRAVENOUS
  Filled 2021-07-10: qty 2

## 2021-07-10 MED ORDER — BACITRACIN ZINC 500 UNIT/GM EX OINT
TOPICAL_OINTMENT | CUTANEOUS | Status: DC | PRN
  Administered 2021-07-10: 1 via TOPICAL

## 2021-07-10 MED ORDER — MELATONIN 3 MG PO TABS
3.0000 mg | ORAL_TABLET | Freq: Every evening | ORAL | Status: DC | PRN
  Administered 2021-07-12 – 2021-07-13 (×3): 3 mg via ORAL
  Filled 2021-07-10 (×3): qty 1

## 2021-07-10 MED ORDER — OXYCODONE HCL 5 MG PO TABS: 10.0000 mg | ORAL_TABLET | ORAL | Status: DC | PRN

## 2021-07-10 MED ORDER — CEFAZOLIN SODIUM-DEXTROSE 2-4 GM/100ML-% IV SOLN
2.0000 g | Freq: Three times a day (TID) | INTRAVENOUS | Status: AC
  Administered 2021-07-10 – 2021-07-11 (×3): 2 g via INTRAVENOUS
  Filled 2021-07-10 (×5): qty 100

## 2021-07-10 MED ORDER — PHENYLEPHRINE 40 MCG/ML (10ML) SYRINGE FOR IV PUSH (FOR BLOOD PRESSURE SUPPORT)
PREFILLED_SYRINGE | INTRAVENOUS | Status: AC
  Filled 2021-07-10: qty 10

## 2021-07-10 MED ORDER — ACETAMINOPHEN 500 MG PO TABS
1000.0000 mg | ORAL_TABLET | Freq: Four times a day (QID) | ORAL | Status: DC | PRN
  Administered 2021-07-11 – 2021-07-13 (×4): 1000 mg via ORAL
  Filled 2021-07-10 (×4): qty 2

## 2021-07-10 MED ORDER — IOHEXOL 350 MG/ML SOLN
100.0000 mL | Freq: Once | INTRAVENOUS | Status: AC | PRN
  Administered 2021-07-10: 100 mL via INTRAVENOUS

## 2021-07-10 MED ORDER — LACTATED RINGERS IV SOLN: INTRAVENOUS | Status: DC | PRN

## 2021-07-10 MED ORDER — PROPOFOL 10 MG/ML IV BOLUS
INTRAVENOUS | Status: DC | PRN
  Administered 2021-07-10: 120 mg via INTRAVENOUS

## 2021-07-10 MED ORDER — ENOXAPARIN SODIUM 30 MG/0.3ML IJ SOSY
30.0000 mg | PREFILLED_SYRINGE | Freq: Two times a day (BID) | INTRAMUSCULAR | Status: DC
  Administered 2021-07-12 – 2021-07-14 (×5): 30 mg via SUBCUTANEOUS
  Filled 2021-07-10 (×5): qty 0.3

## 2021-07-10 MED ORDER — SODIUM CHLORIDE 0.9 % IV SOLN: INTRAVENOUS | Status: DC

## 2021-07-10 MED ORDER — ROCURONIUM BROMIDE 10 MG/ML (PF) SYRINGE
PREFILLED_SYRINGE | INTRAVENOUS | Status: DC | PRN
  Administered 2021-07-10: 40 mg via INTRAVENOUS
  Administered 2021-07-10: 20 mg via INTRAVENOUS

## 2021-07-10 MED ORDER — ONDANSETRON HCL 4 MG PO TABS: 4.0000 mg | ORAL_TABLET | Freq: Four times a day (QID) | ORAL | Status: DC | PRN

## 2021-07-10 MED ORDER — FENTANYL CITRATE (PF) 100 MCG/2ML IJ SOLN
25.0000 ug | INTRAMUSCULAR | Status: DC | PRN
  Administered 2021-07-10: 25 ug via INTRAVENOUS

## 2021-07-10 MED ORDER — VANCOMYCIN HCL 1000 MG IV SOLR
INTRAVENOUS | Status: DC | PRN
  Administered 2021-07-10: 1000 mg via TOPICAL

## 2021-07-10 MED ORDER — OXYCODONE HCL 5 MG PO TABS: 5.0000 mg | ORAL_TABLET | ORAL | Status: DC | PRN

## 2021-07-10 MED ORDER — METOPROLOL TARTRATE 5 MG/5ML IV SOLN
5.0000 mg | Freq: Four times a day (QID) | INTRAVENOUS | Status: DC | PRN
Start: 2021-07-10 — End: 2021-07-14

## 2021-07-10 MED ORDER — DOCUSATE SODIUM 100 MG PO CAPS: 100.0000 mg | ORAL_CAPSULE | Freq: Two times a day (BID) | ORAL | Status: DC

## 2021-07-10 MED ORDER — BACITRACIN ZINC 500 UNIT/GM EX OINT
TOPICAL_OINTMENT | CUTANEOUS | Status: AC
  Filled 2021-07-10: qty 28.35

## 2021-07-10 MED ORDER — ONDANSETRON HCL 4 MG/2ML IJ SOLN
INTRAMUSCULAR | Status: DC | PRN
  Administered 2021-07-10: 4 mg via INTRAVENOUS

## 2021-07-10 MED ORDER — PHENYLEPHRINE 40 MCG/ML (10ML) SYRINGE FOR IV PUSH (FOR BLOOD PRESSURE SUPPORT)
PREFILLED_SYRINGE | INTRAVENOUS | Status: DC | PRN
  Administered 2021-07-10 (×2): 120 ug via INTRAVENOUS
  Administered 2021-07-10 (×2): 80 ug via INTRAVENOUS
  Administered 2021-07-10: 160 ug via INTRAVENOUS

## 2021-07-10 MED ORDER — METOCLOPRAMIDE HCL 5 MG/ML IJ SOLN: 5.0000 mg | Freq: Three times a day (TID) | INTRAMUSCULAR | Status: DC | PRN

## 2021-07-10 MED ORDER — TETANUS-DIPHTHERIA TOXOIDS TD 5-2 LFU IM INJ: 0.5000 mL | INJECTION | Freq: Once | INTRAMUSCULAR | Status: DC

## 2021-07-10 MED ORDER — FENTANYL CITRATE (PF) 100 MCG/2ML IJ SOLN
INTRAMUSCULAR | Status: AC
  Administered 2021-07-10: 50 ug via INTRAVENOUS
  Filled 2021-07-10: qty 2

## 2021-07-10 MED ORDER — MORPHINE SULFATE (PF) 2 MG/ML IV SOLN
2.0000 mg | INTRAVENOUS | Status: DC | PRN
Start: 2021-07-10 — End: 2021-07-11
  Administered 2021-07-11: 2 mg via INTRAVENOUS
  Filled 2021-07-10: qty 1

## 2021-07-10 MED ORDER — IOHEXOL 300 MG/ML  SOLN: INTRAMUSCULAR | Status: DC | PRN

## 2021-07-10 MED ORDER — PHENOL 1.4 % MT LIQD: 1.0000 | OROMUCOSAL | Status: DC | PRN

## 2021-07-10 MED ORDER — DOCUSATE SODIUM 100 MG PO CAPS
100.0000 mg | ORAL_CAPSULE | Freq: Two times a day (BID) | ORAL | Status: DC
  Administered 2021-07-10 – 2021-07-14 (×8): 100 mg via ORAL
  Filled 2021-07-10 (×7): qty 1

## 2021-07-10 MED ORDER — CEFAZOLIN SODIUM 1 G IJ SOLR
INTRAMUSCULAR | Status: AC
  Filled 2021-07-10: qty 10

## 2021-07-10 MED ORDER — FENTANYL CITRATE (PF) 100 MCG/2ML IJ SOLN: 50.0000 ug | Freq: Once | INTRAMUSCULAR | Status: AC

## 2021-07-10 MED ORDER — ONDANSETRON HCL 4 MG/2ML IJ SOLN: 4.0000 mg | Freq: Four times a day (QID) | INTRAMUSCULAR | Status: DC | PRN

## 2021-07-10 MED ORDER — CEFAZOLIN SODIUM-DEXTROSE 2-4 GM/100ML-% IV SOLN
2.0000 g | Freq: Once | INTRAVENOUS | Status: AC
  Administered 2021-07-10: 2 g via INTRAVENOUS
  Filled 2021-07-10: qty 100

## 2021-07-10 MED ORDER — PHENYLEPHRINE HCL-NACL 20-0.9 MG/250ML-% IV SOLN
INTRAVENOUS | Status: DC | PRN
  Administered 2021-07-10: 30 ug/min via INTRAVENOUS

## 2021-07-10 MED ORDER — OXYCODONE HCL 5 MG/5ML PO SOLN: 5.0000 mg | Freq: Once | ORAL | Status: DC | PRN

## 2021-07-10 MED ORDER — LIDOCAINE 2% (20 MG/ML) 5 ML SYRINGE
INTRAMUSCULAR | Status: DC | PRN
  Administered 2021-07-10: 100 mg via INTRAVENOUS

## 2021-07-10 MED ORDER — MENTHOL 3 MG MT LOZG: 1.0000 | LOZENGE | OROMUCOSAL | Status: DC | PRN

## 2021-07-10 MED ORDER — OXYCODONE HCL 5 MG PO TABS: 5.0000 mg | ORAL_TABLET | Freq: Once | ORAL | Status: DC | PRN

## 2021-07-10 MED ORDER — DEXAMETHASONE SODIUM PHOSPHATE 10 MG/ML IJ SOLN
INTRAMUSCULAR | Status: AC
  Filled 2021-07-10: qty 1

## 2021-07-10 MED ORDER — HEPARIN 6000 UNIT IRRIGATION SOLUTION: Status: DC | PRN

## 2021-07-10 MED ORDER — ONDANSETRON 4 MG PO TBDP: 4.0000 mg | ORAL_TABLET | Freq: Four times a day (QID) | ORAL | Status: DC | PRN

## 2021-07-10 MED ORDER — METOCLOPRAMIDE HCL 10 MG PO TABS: 5.0000 mg | ORAL_TABLET | Freq: Three times a day (TID) | ORAL | Status: DC | PRN

## 2021-07-10 MED ORDER — FENTANYL CITRATE (PF) 100 MCG/2ML IJ SOLN
50.0000 ug | Freq: Once | INTRAMUSCULAR | Status: AC
  Administered 2021-07-10: 50 ug via INTRAVENOUS

## 2021-07-10 MED ORDER — OXYCODONE HCL 5 MG PO TABS
5.0000 mg | ORAL_TABLET | ORAL | Status: DC | PRN
  Administered 2021-07-11: 10 mg via ORAL
  Filled 2021-07-10: qty 2

## 2021-07-10 MED ORDER — CHLORHEXIDINE GLUCONATE 0.12 % MT SOLN
OROMUCOSAL | Status: AC
  Administered 2021-07-10: 15 mL
  Filled 2021-07-10: qty 15

## 2021-07-10 MED ORDER — FENTANYL CITRATE PF 50 MCG/ML IJ SOSY
PREFILLED_SYRINGE | INTRAMUSCULAR | Status: AC
  Administered 2021-07-10: 50 ug
  Filled 2021-07-10: qty 2

## 2021-07-10 MED ORDER — FENTANYL CITRATE (PF) 250 MCG/5ML IJ SOLN
INTRAMUSCULAR | Status: AC
  Filled 2021-07-10: qty 5

## 2021-07-10 MED ORDER — ONDANSETRON HCL 4 MG/2ML IJ SOLN
INTRAMUSCULAR | Status: AC
  Filled 2021-07-10: qty 2

## 2021-07-10 MED ORDER — FENTANYL CITRATE (PF) 250 MCG/5ML IJ SOLN
INTRAMUSCULAR | Status: DC | PRN
  Administered 2021-07-10 (×2): 25 ug via INTRAVENOUS
  Administered 2021-07-10 (×2): 50 ug via INTRAVENOUS

## 2021-07-10 MED ORDER — CEFAZOLIN SODIUM-DEXTROSE 1-4 GM/50ML-% IV SOLN
INTRAVENOUS | Status: DC | PRN
  Administered 2021-07-10: 1 g via INTRAVENOUS

## 2021-07-10 MED ORDER — LIDOCAINE 2% (20 MG/ML) 5 ML SYRINGE
INTRAMUSCULAR | Status: AC
  Filled 2021-07-10: qty 5

## 2021-07-10 MED ORDER — TETANUS-DIPHTH-ACELL PERTUSSIS 5-2.5-18.5 LF-MCG/0.5 IM SUSY
0.5000 mL | PREFILLED_SYRINGE | Freq: Once | INTRAMUSCULAR | Status: AC
  Administered 2021-07-10: 0.5 mL via INTRAMUSCULAR
  Filled 2021-07-10: qty 0.5

## 2021-07-10 MED ORDER — ONDANSETRON HCL 4 MG/2ML IJ SOLN
4.0000 mg | Freq: Four times a day (QID) | INTRAMUSCULAR | Status: DC | PRN
  Administered 2021-07-12: 4 mg via INTRAVENOUS
  Filled 2021-07-10 (×2): qty 2

## 2021-07-10 MED ORDER — SUGAMMADEX SODIUM 200 MG/2ML IV SOLN
INTRAVENOUS | Status: DC | PRN
  Administered 2021-07-10: 200 mg via INTRAVENOUS

## 2021-07-10 MED ORDER — FENTANYL CITRATE (PF) 100 MCG/2ML IJ SOLN
INTRAMUSCULAR | Status: AC
  Administered 2021-07-10: 25 ug via INTRAVENOUS
  Filled 2021-07-10: qty 2

## 2021-07-10 MED ORDER — SUCCINYLCHOLINE CHLORIDE 200 MG/10ML IV SOSY
PREFILLED_SYRINGE | INTRAVENOUS | Status: DC | PRN
  Administered 2021-07-10: 100 mg via INTRAVENOUS

## 2021-07-10 SURGICAL SUPPLY — 68 items
BAG COUNTER SPONGE SURGICOUNT (BAG) ×4 IMPLANT
BLADE CLIPPER SURG (BLADE) IMPLANT
BNDG COHESIVE 6X5 TAN STRL LF (GAUZE/BANDAGES/DRESSINGS) ×4 IMPLANT
BNDG ELASTIC 6X10 VLCR STRL LF (GAUZE/BANDAGES/DRESSINGS) ×4 IMPLANT
BRUSH SCRUB EZ PLAIN DRY (MISCELLANEOUS) ×8 IMPLANT
CANISTER SUCT 3000ML PPV (MISCELLANEOUS) IMPLANT
CATH OMNI FLUSH .035X70CM (CATHETERS) ×4 IMPLANT
CHLORAPREP W/TINT 26 (MISCELLANEOUS) ×4 IMPLANT
COVER PROBE W GEL 5X96 (DRAPES) ×4 IMPLANT
COVER SURGICAL LIGHT HANDLE (MISCELLANEOUS) ×4 IMPLANT
DERMABOND ADHESIVE PROPEN (GAUZE/BANDAGES/DRESSINGS) ×2
DERMABOND ADVANCED .7 DNX6 (GAUZE/BANDAGES/DRESSINGS) ×6 IMPLANT
DRAPE C-ARM 42X72 X-RAY (DRAPES) ×4 IMPLANT
DRAPE C-ARMOR (DRAPES) ×4 IMPLANT
DRAPE HALF SHEET 40X57 (DRAPES) ×8 IMPLANT
DRAPE ORTHO SPLIT 77X108 STRL (DRAPES) ×2
DRAPE SURG 17X23 STRL (DRAPES) ×4 IMPLANT
DRAPE SURG ORHT 6 SPLT 77X108 (DRAPES) ×6 IMPLANT
DRAPE U-SHAPE 47X51 STRL (DRAPES) ×4 IMPLANT
DRESSING MEPILEX FLEX 4X4 (GAUZE/BANDAGES/DRESSINGS) ×3 IMPLANT
DRSG ADAPTIC 3X8 NADH LF (GAUZE/BANDAGES/DRESSINGS) IMPLANT
DRSG MEPILEX BORDER 4X12 (GAUZE/BANDAGES/DRESSINGS) ×4 IMPLANT
DRSG MEPILEX BORDER 4X4 (GAUZE/BANDAGES/DRESSINGS) IMPLANT
DRSG MEPILEX BORDER 4X8 (GAUZE/BANDAGES/DRESSINGS) ×8 IMPLANT
DRSG MEPILEX FLEX 4X4 (GAUZE/BANDAGES/DRESSINGS) ×4
DRSG PAD ABDOMINAL 8X10 ST (GAUZE/BANDAGES/DRESSINGS) ×12 IMPLANT
ELECT REM PT RETURN 9FT ADLT (ELECTROSURGICAL) ×4
ELECTRODE REM PT RTRN 9FT ADLT (ELECTROSURGICAL) ×3 IMPLANT
GAUZE SPONGE 4X4 12PLY STRL (GAUZE/BANDAGES/DRESSINGS) ×4 IMPLANT
GLOVE SURG ENC MOIS LTX SZ6.5 (GLOVE) ×12 IMPLANT
GLOVE SURG ENC MOIS LTX SZ7.5 (GLOVE) ×16 IMPLANT
GLOVE SURG UNDER POLY LF SZ6.5 (GLOVE) ×4 IMPLANT
GLOVE SURG UNDER POLY LF SZ7.5 (GLOVE) ×4 IMPLANT
GOWN STRL REUS W/ TWL LRG LVL3 (GOWN DISPOSABLE) ×9 IMPLANT
GOWN STRL REUS W/TWL LRG LVL3 (GOWN DISPOSABLE) ×3
GUIDEWIRE 3.2X400 (WIRE) ×4 IMPLANT
GUIDEWIRE THREADED 2.8 (WIRE) ×24 IMPLANT
KIT BASIN OR (CUSTOM PROCEDURE TRAY) ×4 IMPLANT
KIT TURNOVER KIT B (KITS) ×4 IMPLANT
NS IRRIG 1000ML POUR BTL (IV SOLUTION) ×4 IMPLANT
PACK SURGICAL SETUP 50X90 (CUSTOM PROCEDURE TRAY) ×4 IMPLANT
PACK TOTAL JOINT (CUSTOM PROCEDURE TRAY) ×4 IMPLANT
PAD ARMBOARD 7.5X6 YLW CONV (MISCELLANEOUS) ×8 IMPLANT
PAD CAST 4YDX4 CTTN HI CHSV (CAST SUPPLIES) ×3 IMPLANT
PADDING CAST COTTON 4X4 STRL (CAST SUPPLIES) ×1
PADDING CAST COTTON 6X4 STRL (CAST SUPPLIES) ×4 IMPLANT
SCREW CANN 32MM 6.5X65MM (Screw) ×4 IMPLANT
SCREW CANN 6.5X80 HEXAGONAL (Screw) ×4 IMPLANT
SCREW CANN 6.5X90MM THREADED (Screw) ×4 IMPLANT
SCREW CANN FT SD 6.5X85 (Screw) ×4 IMPLANT
SET MICROPUNCTURE 5F STIFF (MISCELLANEOUS) ×4 IMPLANT
SPONGE T-LAP 18X18 ~~LOC~~+RFID (SPONGE) IMPLANT
STAPLER VISISTAT 35W (STAPLE) ×4 IMPLANT
SUCTION FRAZIER HANDLE 10FR (MISCELLANEOUS) ×1
SUCTION TUBE FRAZIER 10FR DISP (MISCELLANEOUS) ×3 IMPLANT
SUT ETHILON 3 0 PS 1 (SUTURE) ×8 IMPLANT
SUT VIC AB 0 CT1 27 (SUTURE)
SUT VIC AB 0 CT1 27XBRD ANBCTR (SUTURE) IMPLANT
SUT VIC AB 1 CT1 27 (SUTURE)
SUT VIC AB 1 CT1 27XBRD ANBCTR (SUTURE) IMPLANT
SUT VIC AB 2-0 CT1 27 (SUTURE) ×2
SUT VIC AB 2-0 CT1 TAPERPNT 27 (SUTURE) ×6 IMPLANT
TOWEL GREEN STERILE (TOWEL DISPOSABLE) ×8 IMPLANT
TRAY CATH INTERMITTENT SS 16FR (CATHETERS) ×4 IMPLANT
TRAY FOLEY MTR SLVR 16FR STAT (SET/KITS/TRAYS/PACK) IMPLANT
WASHER FOR 5.0 SCREWS (Washer) ×12 IMPLANT
WATER STERILE IRR 1000ML POUR (IV SOLUTION) ×8 IMPLANT
WIRE STARTER BENTSON 035X150 (WIRE) ×4 IMPLANT
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
## 2021-07-10 NOTE — Interval H&P Note (Signed)
History and Physical Interval Note:  07/10/2021 11:27 AM  Annette Richard  has presented today for surgery, with the diagnosis of Left hip dislocation.  The various methods of treatment have been discussed with the patient and family. After consideration of risks, benefits and other options for treatment, the patient has consented to  Procedure(s): CLOSED REDUCTION HIP (Left) [place] (N/A) LOWER EXTREMITY ANGIOGRAM (N/A) as a surgical intervention.  The patient's history has been reviewed, patient examined, no change in status, stable for surgery.  I have reviewed the patient's chart and labs.  Questions were answered to the patient's satisfaction.     Lennette Bihari P Jedaiah Rathbun

## 2021-07-10 NOTE — Transfer of Care (Signed)
Immediate Anesthesia Transfer of Care Note  Patient: Annette Richard  Procedure(s) Performed: ATTEMPTED CLOSED REDUCTION HIP, OPEN REDUCTION LEFT HIP (Left: Hip) FACIAL LACERATION REPAIR (Face) EXAM UNDER ANESTHESIA  Patient Location: PACU  Anesthesia Type:General  Level of Consciousness: awake and drowsy  Airway & Oxygen Therapy: Patient Spontanous Breathing and Patient connected to nasal cannula oxygen  Post-op Assessment: Report given to RN and Post -op Vital signs reviewed and stable  Post vital signs: Reviewed and stable  Last Vitals:  Vitals Value Taken Time  BP 216/191 07/10/21 1532  Temp    Pulse 101 07/10/21 1533  Resp 21 07/10/21 1533  SpO2 99 % 07/10/21 1533  Vitals shown include unvalidated device data.  Last Pain:  Vitals:   07/10/21 1101  TempSrc:   PainSc: 4          Complications: No notable events documented.
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
## 2021-07-10 NOTE — Progress Notes (Signed)
After the left hip fracture dislocation was reduced in the OR she now has an easily palpable dorsalis pedis pulse in the left foot.  I do not see any indication for arteriogram at this time.  We will follow her exam clinically in the hospital.  Marty Heck, MD [facility] [place] Office: [place]
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
## 2021-07-10 NOTE — Anesthesia Postprocedure Evaluation (Signed)
Anesthesia Post Note  Patient: Annette Richard  Procedure(s) Performed: ATTEMPTED CLOSED REDUCTION HIP, OPEN REDUCTION LEFT HIP (Left: Hip) FACIAL LACERATION REPAIR (Face) EXAM UNDER ANESTHESIA     Patient location during evaluation: PACU Anesthesia Type: General Level of consciousness: awake and alert and oriented Pain management: pain level controlled Vital Signs Assessment: post-procedure vital signs reviewed and stable Respiratory status: spontaneous breathing, nonlabored ventilation and respiratory function stable Cardiovascular status: blood pressure returned to baseline Postop Assessment: no apparent nausea or vomiting Anesthetic complications: no   No notable events documented.  Last Vitals:  Vitals:   07/10/21 1639 07/10/21 1644  BP: (!) 142/84 128/88  Pulse:  75  Resp:  16  Temp:    SpO2:  96%    Last Pain:  Vitals:   07/10/21 1639  TempSrc:   PainSc: Asleep                 Marthenia Rolling
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
## 2021-07-10 NOTE — Anesthesia Procedure Notes (Signed)
Procedure Name: Intubation Date/Time: 07/10/2021 12:43 PM Performed by: Inda Coke, CRNA Pre-anesthesia Checklist: Patient identified, Emergency Drugs available, Suction available and Patient being monitored Patient Re-evaluated:Patient Re-evaluated prior to induction Oxygen Delivery Method: Circle System Utilized Preoxygenation: Pre-oxygenation with 100% oxygen Induction Type: IV induction and Rapid sequence Ventilation: Mask ventilation without difficulty Laryngoscope Size: 3 and Glidescope Grade View: Grade I Tube type: Oral Tube size: 7.0 mm Number of attempts: 1 Airway Equipment and Method: Oral airway, Rigid stylet and Video-laryngoscopy Placement Confirmation: ETT inserted through vocal cords under direct vision, positive ETCO2 and breath sounds checked- equal and bilateral Secured at: 21 cm Tube secured with: Tape Dental Injury: Teeth and Oropharynx as per pre-operative assessment  Comments: Elective glidescope d/t trauma/cervical collar
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
## 2021-07-10 NOTE — Progress Notes (Addendum)
Pt concerned about making family aware of hospitalization.    --Requested that Dorathy Kinsman (Daughter) [phone] [facility]) be contacted.  Per patient, dtr works at [employer] and is not allowed to carry cell phone.  Renda Rolls, RN called [employer] HR requesting dtr call hospital short stay department.  --Pt requested that an officer be sent to pt's home to make grandchildren aware of hospitalization.  Attempted to call pt's cell phone that was left at home, no answer by grandchildren.  74 year old granddaughter left at home, 35 year old granddaughter left at home sleeping  Non-emergent line called, [phone].  Dispatch sending officer to the home per pt request.  Addendum: Engineer, structural completed welfare check on grandchildren.  Obtained contact info for pt's daughter--cell phone number to call Larene Beach: [phone].  New cell phone updated in chart. Spoke with Larene Beach, aware of pt's hospitalization and updated per pt's request.
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
## 2021-07-11 ENCOUNTER — Encounter (HOSPITAL_COMMUNITY): Payer: Self-pay | Admitting: Student

## 2021-07-11 DIAGNOSIS — S73002A Unspecified subluxation of left hip, initial encounter: Secondary | ICD-10-CM | POA: Diagnosis not present

## 2021-07-11 DIAGNOSIS — I998 Other disorder of circulatory system: Secondary | ICD-10-CM | POA: Diagnosis not present

## 2021-07-11 LAB — BPAM FFP
Blood Product Expiration Date: 202210252359
Blood Product Expiration Date: 202210302359
ISSUE DATE / TIME: 202210241005
ISSUE DATE / TIME: 202210241005
Unit Type and Rh: 6200
Unit Type and Rh: 6200

## 2021-07-11 LAB — CBC
HCT: 32 % — ABNORMAL LOW (ref 36.0–46.0)
Hemoglobin: 10.6 g/dL — ABNORMAL LOW (ref 12.0–15.0)
MCH: 29.5 pg (ref 26.0–34.0)
MCHC: 33.1 g/dL (ref 30.0–36.0)
MCV: 89.1 fL (ref 80.0–100.0)
Platelets: 167 10*3/uL (ref 150–400)
RBC: 3.59 MIL/uL — ABNORMAL LOW (ref 3.87–5.11)
RDW: 13.3 % (ref 11.5–15.5)
WBC: 8.7 10*3/uL (ref 4.0–10.5)
nRBC: 0 % (ref 0.0–0.2)

## 2021-07-11 LAB — BASIC METABOLIC PANEL
Anion gap: 8 (ref 5–15)
BUN: 10 mg/dL (ref 8–23)
CO2: 23 mmol/L (ref 22–32)
Calcium: 8 mg/dL — ABNORMAL LOW (ref 8.9–10.3)
Chloride: 106 mmol/L (ref 98–111)
Creatinine, Ser: 1.07 mg/dL — ABNORMAL HIGH (ref 0.44–1.00)
GFR, Estimated: 58 mL/min — ABNORMAL LOW (ref >60)
Glucose, Bld: 136 mg/dL — ABNORMAL HIGH (ref 70–99)
Potassium: 3.8 mmol/L (ref 3.5–5.1)
Sodium: 137 mmol/L (ref 135–145)

## 2021-07-11 LAB — PREPARE FRESH FROZEN PLASMA
Unit division: 0
Unit division: 0

## 2021-07-11 LAB — BPAM RBC
Blood Product Expiration Date: 202211052359
Blood Product Expiration Date: 202211192359
ISSUE DATE / TIME: 202210240851
ISSUE DATE / TIME: 202210240851
Unit Type and Rh: 5100
Unit Type and Rh: 5100

## 2021-07-11 LAB — TYPE AND SCREEN
ABO/RH(D): O POS
Antibody Screen: NEGATIVE
Unit division: 0
Unit division: 0

## 2021-07-11 MED ORDER — VITAMIN D (ERGOCALCIFEROL) 1.25 MG (50000 UNIT) PO CAPS
50000.0000 [IU] | ORAL_CAPSULE | ORAL | Status: DC
  Administered 2021-07-11: 50000 [IU] via ORAL
  Filled 2021-07-11: qty 1

## 2021-07-11 MED ORDER — MORPHINE SULFATE (PF) 2 MG/ML IV SOLN: 1.0000 mg | INTRAVENOUS | Status: DC | PRN

## 2021-07-11 MED ORDER — ASPIRIN EC 81 MG PO TBEC
81.0000 mg | DELAYED_RELEASE_TABLET | Freq: Every day | ORAL | Status: DC
  Administered 2021-07-11 – 2021-07-14 (×4): 81 mg via ORAL
  Filled 2021-07-11 (×4): qty 1

## 2021-07-11 NOTE — Discharge Instructions (Signed)
Weightbearing: Tough down weight bearing to the left lower extremity ROM: Unrestricted hip range of motion as tolerated Showering: Ok to begin showering 07/13/21 Orthopedic device(s): None  Continue D2 supplementation q7 days x 8 weeks
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
## 2021-07-11 NOTE — TOC CAGE-AID Note (Signed)
Transition of Care [facility]) - CAGE-AID Screening   Patient Details  Name: Annette Richard MRN: [id number] Date of Birth: 05/29/1956  [facility] CM/SW Contact:    Riham Polyakov C Tarpley-Carter, LCSWA Phone Number: 07/11/2021, 9:55 AM   Clinical Narrative: Pt participated in [place].  Pt stated she does not use substance or ETOH.  Pt does smoke cigarettes. Pt was not offered resources, due to no usage of substance or ETOH.     Samary Shatz Tarpley-Carter, MSW, LCSW-A Pronouns:  She/Her/Hers [facility] HealthTransitions of Care Clinical Social Worker Direct Number:  [phone] [email or web address]   CAGE-AID Screening:    Have You Ever Felt You Ought to Cut Down on Your Drinking or Drug Use?: No Have People Annoyed You By [employer] Your Drinking Or Drug Use?: No Have You Felt Bad Or Guilty About Your Drinking Or Drug Use?: No Have You Ever Had a Drink or Used Drugs First Thing In The Morning to Steady Your Nerves or to Get Rid of a Hangover?: No CAGE-AID Score: 0  Substance Abuse Education Offered: No (Pt smokes cigarettes.)  Substance abuse interventions: Scientist, clinical (histocompatibility and immunogenetics)

## 2021-07-11 NOTE — Progress Notes (Signed)
Orthopaedic Trauma Progress Note  SUBJECTIVE: Notes she is doing much better than yesterday. Pain controlled. Denies numbness or tingling throughout extremity  OBJECTIVE:  Vitals:   07/10/21 2105 07/11/21 0419  BP: 137/86 122/76  Pulse: 93 90  Resp: 17 20  Temp: 99.3 F (37.4 C) 97.9 F (36.6 C)  SpO2: 92% 90%    General: Laying in bed resting comfortably. NAD Respiratory: No increased work of breathing.  LLE: Dressing CDI. Ankle DF/PF intact. Skin warm and well perfused. Endorses sensation throughout. 2+ DP pulse.   IMAGING: Stable post op imaging.   LABS:  Results for orders placed or performed during the hospital encounter of 07/10/21 (from the past 24 hour(s))  Comprehensive metabolic panel     Status: Abnormal   Collection Time: 07/10/21  8:38 AM  Result Value Ref Range   Sodium 136 135 - 145 mmol/L   Potassium 3.3 (L) 3.5 - 5.1 mmol/L   Chloride 104 98 - 111 mmol/L   CO2 21 (L) 22 - 32 mmol/L   Glucose, Bld 209 (H) 70 - 99 mg/dL   BUN 8 8 - 23 mg/dL   Creatinine, Ser 1.23 (H) 0.44 - 1.00 mg/dL   Calcium 8.8 (L) 8.9 - 10.3 mg/dL   Total Protein 6.4 (L) 6.5 - 8.1 g/dL   Albumin 3.5 3.5 - 5.0 g/dL   AST 22 15 - 41 U/L   ALT 14 0 - 44 U/L   Alkaline Phosphatase 94 38 - 126 U/L   Total Bilirubin 0.6 0.3 - 1.2 mg/dL   GFR, Estimated 49 (L) >60 mL/min   Anion gap 11 5 - 15  CBC     Status: Abnormal   Collection Time: 07/10/21  8:38 AM  Result Value Ref Range   WBC 12.1 (H) 4.0 - 10.5 K/uL   RBC 4.43 3.87 - 5.11 MIL/uL   Hemoglobin 13.0 12.0 - 15.0 g/dL   HCT 39.7 36.0 - 46.0 %   MCV 89.6 80.0 - 100.0 fL   MCH 29.3 26.0 - 34.0 pg   MCHC 32.7 30.0 - 36.0 g/dL   RDW 13.1 11.5 - 15.5 %   Platelets 343 150 - 400 K/uL   nRBC 0.0 0.0 - 0.2 %  Ethanol     Status: None   Collection Time: 07/10/21  8:38 AM  Result Value Ref Range   Alcohol, Ethyl (B) <10 <10 mg/dL  Lactic acid, plasma     Status: Abnormal   Collection Time: 07/10/21  8:38 AM  Result Value Ref Range    Lactic Acid, Venous 3.2 (HH) 0.5 - 1.9 mmol/L  Protime-INR     Status: Abnormal   Collection Time: 07/10/21  8:38 AM  Result Value Ref Range   Prothrombin Time 17.1 (H) 11.4 - 15.2 seconds   INR 1.4 (H) 0.8 - 1.2  Type and screen Ordered by PROVIDER DEFAULT     Status: None (Preliminary result)   Collection Time: 07/10/21  8:40 AM  Result Value Ref Range   ABO/RH(D) O POS    Antibody Screen NEG    Sample Expiration 07/13/2021,2359    Unit Number [id number]    Blood Component Type RED CELLS,LR    Unit division 00    Status of Unit ISSUED    Unit tag comment VERBAL ORDERS PER DR ALLEN    Transfusion Status OK TO TRANSFUSE    Crossmatch Result COMPATIBLE    Unit Number [id number]    Blood Component Type RED CELLS,LR  Unit division 00    Status of Unit ISSUED    Unit tag comment VERBAL ORDERS PER DR ALLEN    Transfusion Status OK TO TRANSFUSE    Crossmatch Result COMPATIBLE   I-Stat Chem 8, ED     Status: Abnormal   Collection Time: 07/10/21  8:49 AM  Result Value Ref Range   Sodium 139 135 - 145 mmol/L   Potassium 3.4 (L) 3.5 - 5.1 mmol/L   Chloride 105 98 - 111 mmol/L   BUN 10 8 - 23 mg/dL   Creatinine, Ser 1.00 0.44 - 1.00 mg/dL   Glucose, Bld 205 (H) 70 - 99 mg/dL   Calcium, Ion 1.07 (L) 1.15 - 1.40 mmol/L   TCO2 22 22 - 32 mmol/L   Hemoglobin 12.9 12.0 - 15.0 g/dL   HCT 38.0 36.0 - 46.0 %  Resp Panel by RT-PCR (Flu A&B, Covid) Nasopharyngeal Swab     Status: None   Collection Time: 07/10/21  9:57 AM   Specimen: Nasopharyngeal Swab; Nasopharyngeal(NP) swabs in vial transport medium  Result Value Ref Range   SARS Coronavirus 2 by RT PCR NEGATIVE NEGATIVE   Influenza A by PCR NEGATIVE NEGATIVE   Influenza B by PCR NEGATIVE NEGATIVE  Prepare fresh frozen plasma     Status: None (Preliminary result)   Collection Time: 07/10/21 10:05 AM  Result Value Ref Range   Unit Number [id number]    Blood Component Type LIQ PLASMA    Unit division 00    Status of Unit  ISSUED    Unit tag comment VERBAL ORDERS PER DR ALLEN    Transfusion Status OK TO TRANSFUSE    Unit Number [id number]    Blood Component Type LIQ PLASMA    Unit division 00    Status of Unit ISSUED    Unit tag comment VERBAL ORDERS PER DR ALLEN    Transfusion Status      OK TO TRANSFUSE Performed at [facility], [address] [place], [place] [address]   ABO/Rh     Status: None   Collection Time: 07/10/21 10:55 AM  Result Value Ref Range   ABO/RH(D)      O POS Performed at [facility] [address], [place], [place] [address]   POCT I-Stat EG7     Status: Abnormal   Collection Time: 07/10/21  1:13 PM  Result Value Ref Range   pH, Ven 7.200 (L) 7.250 - 7.430   pCO2, Ven 59.2 44.0 - 60.0 mmHg   pO2, Ven 27.0 (LL) 32.0 - 45.0 mmHg   Bicarbonate 23.1 20.0 - 28.0 mmol/L   TCO2 25 22 - 32 mmol/L   O2 Saturation 37.0 %   Acid-base deficit 6.0 (H) 0.0 - 2.0 mmol/L   Sodium 141 135 - 145 mmol/L   Potassium 4.2 3.5 - 5.1 mmol/L   Calcium, Ion 1.13 (L) 1.15 - 1.40 mmol/L   HCT 39.0 36.0 - 46.0 %   Hemoglobin 13.3 12.0 - 15.0 g/dL   Sample type VENOUS    Comment NOTIFIED PHYSICIAN   Glucose, capillary     Status: Abnormal   Collection Time: 07/10/21  1:35 PM  Result Value Ref Range   Glucose-Capillary 122 (H) 70 - 99 mg/dL  Glucose, capillary     Status: Abnormal   Collection Time: 07/10/21  3:33 PM  Result Value Ref Range   Glucose-Capillary 126 (H) 70 - 99 mg/dL  HIV Antibody (routine testing w rflx)  Status: None   Collection Time: 07/10/21  9:23 PM  Result Value Ref Range   HIV Screen 4th Generation wRfx Non Reactive Non Reactive  VITAMIN D 25 Hydroxy (Vit-D Deficiency, Fractures)     Status: Abnormal   Collection Time: 07/10/21  9:23 PM  Result Value Ref Range   Vit D, 25-Hydroxy 5.53 (L) 30 - 100 ng/mL  CBC     Status: Abnormal   Collection Time: 07/11/21  2:46 AM  Result Value Ref Range   WBC 8.7 4.0 - 10.5 K/uL   RBC 3.59 (L) 3.87 - 5.11  MIL/uL   Hemoglobin 10.6 (L) 12.0 - 15.0 g/dL   HCT 32.0 (L) 36.0 - 46.0 %   MCV 89.1 80.0 - 100.0 fL   MCH 29.5 26.0 - 34.0 pg   MCHC 33.1 30.0 - 36.0 g/dL   RDW 13.3 11.5 - 15.5 %   Platelets 167 150 - 400 K/uL   nRBC 0.0 0.0 - 0.2 %  Basic metabolic panel     Status: Abnormal   Collection Time: 07/11/21  2:46 AM  Result Value Ref Range   Sodium 137 135 - 145 mmol/L   Potassium 3.8 3.5 - 5.1 mmol/L   Chloride 106 98 - 111 mmol/L   CO2 23 22 - 32 mmol/L   Glucose, Bld 136 (H) 70 - 99 mg/dL   BUN 10 8 - 23 mg/dL   Creatinine, Ser 1.07 (H) 0.44 - 1.00 mg/dL   Calcium 8.0 (L) 8.9 - 10.3 mg/dL   GFR, Estimated 58 (L) >60 mL/min   Anion gap 8 5 - 15    ASSESSMENT: Annette Richard is a 65 y.o. female, 1 Day Post-Op s/p  OPEN REDUCTION LEFT HIP FACIAL LACERATION REPAIR HIP EXAM UNDER ANESTHESIA  CV/Blood loss: Acute blood loss anemia, Hgb 10.6 this AM. Hemodynamically stable  PLAN: Weightbearing: TDWB LLE ROM: Unrestricted hip ROM as tolerated Incisional and dressing care: Plan to remove dressing tomorrow Showering: Ok to begin showering 07/13/21 Orthopedic device(s): None  Pain management:  1. Tylenol 1000 mg q 6 hours PRN 2. Robaxin 750 mg q 6 hours PRN 3. Oxycodone 5-15 mg q 4 hours PRN 4. Morphine 1-2 mg q 2 hours PRN VTE prophylaxis: Lovenox, SCDs ID:  Ancef 2gm post op Foley/Lines:  No foley, KVO IVFs Impediments to Fracture Healing: Vit D level 5, start on D2 supplementation Dispo: PT/OT eval today, dispo pending. Plan to remove dressing tomorrow. Okay for discharge from ortho standpoint once cleared by trauma team and therapies D/C recs: - continue D2 supplementation q7 days x 8 weeks - Switch to DOAC x 30 days for DVT prophylaxis Follow - up plan: 2 weeks after d/c for repeat x-rays and wound check  Contact information:  Katha Hamming MD, Patrecia Pace PA-C. After hours and holidays please check [email or web address] for group call information for [facility]   Millie Forde  A. Ricci Barker, PA-C [phone] (office) [email or web address]
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
## 2021-07-11 NOTE — Progress Notes (Signed)
OT NOTE  OT spoke with PT and feel that one more day might be more appropriate time for OT to start evaluation. OT to check back at more appropriate time.   Fleeta Emmer, OTR/L  Acute Rehabilitation Services Pager: [phone] Office: [phone] .
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
## 2021-07-12 DIAGNOSIS — S73035A Other anterior dislocation of left hip, initial encounter: Secondary | ICD-10-CM

## 2021-07-12 DIAGNOSIS — S72112A Displaced fracture of greater trochanter of left femur, initial encounter for closed fracture: Secondary | ICD-10-CM

## 2021-07-12 LAB — BASIC METABOLIC PANEL
Anion gap: 7 (ref 5–15)
BUN: 9 mg/dL (ref 8–23)
CO2: 21 mmol/L — ABNORMAL LOW (ref 22–32)
Calcium: 7.7 mg/dL — ABNORMAL LOW (ref 8.9–10.3)
Chloride: 108 mmol/L (ref 98–111)
Creatinine, Ser: 0.91 mg/dL (ref 0.44–1.00)
GFR, Estimated: >60 mL/min (ref >60)
Glucose, Bld: 123 mg/dL — ABNORMAL HIGH (ref 70–99)
Potassium: 3.3 mmol/L — ABNORMAL LOW (ref 3.5–5.1)
Sodium: 136 mmol/L (ref 135–145)

## 2021-07-12 LAB — CBC
HCT: 24.9 % — ABNORMAL LOW (ref 36.0–46.0)
Hemoglobin: 8.2 g/dL — ABNORMAL LOW (ref 12.0–15.0)
MCH: 29.5 pg (ref 26.0–34.0)
MCHC: 32.9 g/dL (ref 30.0–36.0)
MCV: 89.6 fL (ref 80.0–100.0)
Platelets: 142 10*3/uL — ABNORMAL LOW (ref 150–400)
RBC: 2.78 MIL/uL — ABNORMAL LOW (ref 3.87–5.11)
RDW: 13.3 % (ref 11.5–15.5)
WBC: 9.3 10*3/uL (ref 4.0–10.5)
nRBC: 0 % (ref 0.0–0.2)

## 2021-07-12 MED ORDER — POTASSIUM CHLORIDE CRYS ER 20 MEQ PO TBCR
40.0000 meq | EXTENDED_RELEASE_TABLET | Freq: Two times a day (BID) | ORAL | Status: AC
  Administered 2021-07-12 (×2): 40 meq via ORAL
  Filled 2021-07-12 (×2): qty 2

## 2021-07-12 MED ORDER — OXYCODONE HCL 5 MG PO TABS
5.0000 mg | ORAL_TABLET | ORAL | Status: DC | PRN
  Administered 2021-07-14: 10 mg via ORAL
  Filled 2021-07-12: qty 2
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
## 2021-07-12 NOTE — TOC Progression Note (Signed)
Transition of Care [facility]) - Progression Note    Patient Details  Name: Annette Richard MRN: [id number] Date of Birth: 1956/04/23  Transition of Care [facility]) CM/SW Contact  Ella Bodo, RN Phone Number: 07/12/2021, 3:12 PM  Clinical Narrative:    Patient with noted dizziness and hypotension with therapy today.  Spoke with patient regarding living situation; she states that her 64 year old granddaughter is with her during the day, son-in-law and daughter are there in the evenings and at night.  She states someone will be with her 24 hours a day.  PT recommending home health follow-up; OT recommending outpatient therapy.  Patient's insurance is not contracted with any local home health agencies; patient is agreeable to outpatient therapies, and states she will have transportation to appointments.  She is agreeable to recommended DME; 3 in 1 and rolling walker requested from [place], to be delivered to bedside prior to discharge.  Encouraged patient to keep PCP follow-up appointment, and then follow-up with [facility] for mammogram.  She verbalizes understanding of this.   Expected Discharge Plan: OP Rehab Barriers to Discharge: Continued Medical Work up  Expected Discharge Plan and Services Expected Discharge Plan: OP Rehab   Discharge Planning Services: CM Consult, Follow-up appt scheduled   Living arrangements for the past 2 months: Single Family Home                 DME Arranged: 3-N-1, Walker rolling DME Agency: [employer] Date DME Agency Contacted: 07/12/21 Time DME Agency Contacted: [phone] Representative spoke with at DME Agency: [facility] ([place]) Interventions    Readmission Risk Interventions No flowsheet data found.  Reinaldo Raddle, RN, BSN  Trauma/Neuro ICU Case Manager [phone]

## 2021-07-12 NOTE — Progress Notes (Signed)
Orthopaedic Trauma Progress Note  SUBJECTIVE: Doing well today. Pain in left hip but overall is controlled. Denies numbness or tingling throughout extremities. Patient got up with therapies while I was in room. She was able to walk about 3 steps forward with walker but then needed to sit down due to dizziness. BP after sitting was 65/50  OBJECTIVE:  Vitals:   07/12/21 0340 07/12/21 0723  BP: (!) 101/59 105/63  Pulse: 96 93  Resp: (!) 23 20  Temp: 98.9 F (37.2 C) 98 F (36.7 C)  SpO2: 93% 94%    General: Sitting up on EOB. NAD Respiratory: No increased work of breathing.  LLE: Dressings CDI. Ankle DF/PF intact. Skin warm and well perfused. Endorses sensation throughout. 2+ DP pulse.   IMAGING: Stable post op imaging.   LABS:  Results for orders placed or performed during the hospital encounter of 07/10/21 (from the past 24 hour(s))  CBC     Status: Abnormal   Collection Time: 07/12/21  4:14 AM  Result Value Ref Range   WBC 9.3 4.0 - 10.5 K/uL   RBC 2.78 (L) 3.87 - 5.11 MIL/uL   Hemoglobin 8.2 (L) 12.0 - 15.0 g/dL   HCT 24.9 (L) 36.0 - 46.0 %   MCV 89.6 80.0 - 100.0 fL   MCH 29.5 26.0 - 34.0 pg   MCHC 32.9 30.0 - 36.0 g/dL   RDW 13.3 11.5 - 15.5 %   Platelets 142 (L) 150 - 400 K/uL   nRBC 0.0 0.0 - 0.2 %  Basic metabolic panel     Status: Abnormal   Collection Time: 07/12/21  4:14 AM  Result Value Ref Range   Sodium 136 135 - 145 mmol/L   Potassium 3.3 (L) 3.5 - 5.1 mmol/L   Chloride 108 98 - 111 mmol/L   CO2 21 (L) 22 - 32 mmol/L   Glucose, Bld 123 (H) 70 - 99 mg/dL   BUN 9 8 - 23 mg/dL   Creatinine, Ser 0.91 0.44 - 1.00 mg/dL   Calcium 7.7 (L) 8.9 - 10.3 mg/dL   GFR, Estimated >60 >60 mL/min   Anion gap 7 5 - 15    ASSESSMENT: Annette Richard is a 65 y.o. female, 2 Days Post-Op s/p  OPEN REDUCTION LEFT HIP FACIAL LACERATION REPAIR HIP EXAM UNDER ANESTHESIA  CV/Blood loss: Acute blood loss anemia, Hgb 8.2 this AM.   PLAN: Weightbearing: TDWB LLE ROM:  Unrestricted hip ROM as tolerated Incisional and dressing care: Change PRN. Ok to leave open to air.  Showering: Ok to begin showering 07/13/21 Orthopedic device(s): None  Pain management:  1. Tylenol 1000 mg q 6 hours PRN 2. Robaxin 750 mg q 6 hours PRN 3. Oxycodone 5-15 mg q 4 hours PRN 4. Morphine 1-2 mg q 2 hours PRN VTE prophylaxis: Lovenox, SCDs ID:  Ancef 2gm post op completed Foley/Lines:  No foley, KVO IVFs Impediments to Fracture Healing: Vit D level 5, start on D2 supplementation Dispo: Therapies as tolerated, they plan to work on stairs today vs tomorrow. PT/OT recommending HH.  Okay for discharge from ortho standpoint once cleared by trauma team and therapies  D/C recs: - continue D2 supplementation q7 days x 8 weeks - Switch to DOAC x 30 days for DVT prophylaxis Follow - up plan: 2 weeks after d/c for repeat x-rays and wound check  Contact information:  Katha Hamming MD, Patrecia Pace PA-C. After hours and holidays please check [email or web address] for group call information for [facility]  [name], PA-C [phone] (office) [email or web address]
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
## 2021-07-13 LAB — BASIC METABOLIC PANEL
Anion gap: 4 — ABNORMAL LOW (ref 5–15)
BUN: 8 mg/dL (ref 8–23)
CO2: 20 mmol/L — ABNORMAL LOW (ref 22–32)
Calcium: 7.9 mg/dL — ABNORMAL LOW (ref 8.9–10.3)
Chloride: 113 mmol/L — ABNORMAL HIGH (ref 98–111)
Creatinine, Ser: 0.85 mg/dL (ref 0.44–1.00)
GFR, Estimated: >60 mL/min (ref >60)
Glucose, Bld: 98 mg/dL (ref 70–99)
Potassium: 4 mmol/L (ref 3.5–5.1)
Sodium: 137 mmol/L (ref 135–145)

## 2021-07-13 LAB — CBC
HCT: 22.6 % — ABNORMAL LOW (ref 36.0–46.0)
Hemoglobin: 7.5 g/dL — ABNORMAL LOW (ref 12.0–15.0)
MCH: 29.9 pg (ref 26.0–34.0)
MCHC: 33.2 g/dL (ref 30.0–36.0)
MCV: 90 fL (ref 80.0–100.0)
Platelets: 141 10*3/uL — ABNORMAL LOW (ref 150–400)
RBC: 2.51 MIL/uL — ABNORMAL LOW (ref 3.87–5.11)
RDW: 13.2 % (ref 11.5–15.5)
WBC: 8.9 10*3/uL (ref 4.0–10.5)
nRBC: 0 % (ref 0.0–0.2)

## 2021-07-13 LAB — HEMOGLOBIN AND HEMATOCRIT, BLOOD
HCT: 27.4 % — ABNORMAL LOW (ref 36.0–46.0)
Hemoglobin: 9.3 g/dL — ABNORMAL LOW (ref 12.0–15.0)

## 2021-07-13 LAB — PREPARE RBC (CROSSMATCH)

## 2021-07-13 MED ORDER — FERROUS SULFATE 325 (65 FE) MG PO TABS
325.0000 mg | ORAL_TABLET | Freq: Every day | ORAL | Status: DC
  Administered 2021-07-14: 325 mg via ORAL
  Filled 2021-07-13: qty 1

## 2021-07-13 MED ORDER — ASCORBIC ACID 500 MG PO TABS
500.0000 mg | ORAL_TABLET | Freq: Two times a day (BID) | ORAL | Status: DC
  Administered 2021-07-13 – 2021-07-14 (×2): 500 mg via ORAL
  Filled 2021-07-13 (×2): qty 1

## 2021-07-13 MED ORDER — SODIUM CHLORIDE 0.9% IV SOLUTION: Freq: Once | INTRAVENOUS | Status: AC

## 2021-07-13 MED ORDER — POLYETHYLENE GLYCOL 3350 17 G PO PACK
17.0000 g | PACK | Freq: Every day | ORAL | Status: DC
  Administered 2021-07-13 – 2021-07-14 (×2): 17 g via ORAL
  Filled 2021-07-13 (×2): qty 1
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
## 2021-07-14 ENCOUNTER — Other Ambulatory Visit (HOSPITAL_COMMUNITY): Payer: Self-pay

## 2021-07-14 LAB — BPAM RBC
Blood Product Expiration Date: 202211262359
ISSUE DATE / TIME: 202210271316
Unit Type and Rh: 5100

## 2021-07-14 LAB — TYPE AND SCREEN
ABO/RH(D): O POS
Antibody Screen: NEGATIVE
Unit division: 0

## 2021-07-14 LAB — CBC
HCT: 25 % — ABNORMAL LOW (ref 36.0–46.0)
Hemoglobin: 8.2 g/dL — ABNORMAL LOW (ref 12.0–15.0)
MCH: 29.3 pg (ref 26.0–34.0)
MCHC: 32.8 g/dL (ref 30.0–36.0)
MCV: 89.3 fL (ref 80.0–100.0)
Platelets: 193 10*3/uL (ref 150–400)
RBC: 2.8 MIL/uL — ABNORMAL LOW (ref 3.87–5.11)
RDW: 13.3 % (ref 11.5–15.5)
WBC: 7.5 10*3/uL (ref 4.0–10.5)
nRBC: 0 % (ref 0.0–0.2)

## 2021-07-14 MED ORDER — DOCUSATE SODIUM 100 MG PO CAPS: 100.0000 mg | ORAL_CAPSULE | Freq: Two times a day (BID) | ORAL | 0 refills | Status: DC | PRN

## 2021-07-14 MED ORDER — OXYCODONE HCL 5 MG PO TABS
5.0000 mg | ORAL_TABLET | Freq: Four times a day (QID) | ORAL | 0 refills | Status: DC | PRN
  Filled 2021-07-14: qty 20, 5d supply, fill #0

## 2021-07-14 MED ORDER — METHOCARBAMOL 750 MG PO TABS
750.0000 mg | ORAL_TABLET | Freq: Four times a day (QID) | ORAL | 0 refills | Status: DC | PRN
  Filled 2021-07-14: qty 45, 12d supply, fill #0

## 2021-07-14 MED ORDER — ASCORBIC ACID 500 MG PO TABS: 500.0000 mg | ORAL_TABLET | Freq: Two times a day (BID) | ORAL | 0 refills | Status: DC

## 2021-07-14 MED ORDER — TAB-A-VITE/IRON PO TABS: 1.0000 | ORAL_TABLET | Freq: Every day | ORAL | 0 refills | Status: AC

## 2021-07-14 MED ORDER — APIXABAN 2.5 MG PO TABS
2.5000 mg | ORAL_TABLET | Freq: Two times a day (BID) | ORAL | 0 refills | Status: DC
  Filled 2021-07-14: qty 60, 30d supply, fill #0

## 2021-07-14 MED ORDER — ASPIRIN 81 MG PO TBEC
81.0000 mg | DELAYED_RELEASE_TABLET | Freq: Every day | ORAL | 11 refills | Status: DC
  Filled 2021-07-14: qty 30, 30d supply, fill #0

## 2021-07-14 MED ORDER — POLYETHYLENE GLYCOL 3350 17 G PO PACK: 17.0000 g | PACK | Freq: Every day | ORAL | 0 refills | Status: DC | PRN

## 2021-07-14 MED ORDER — ONDANSETRON HCL 4 MG PO TABS
4.0000 mg | ORAL_TABLET | Freq: Four times a day (QID) | ORAL | 0 refills | Status: DC | PRN
  Filled 2021-07-14: qty 20, 5d supply, fill #0

## 2021-07-14 MED ORDER — VITAMIN D (ERGOCALCIFEROL) 1.25 MG (50000 UNIT) PO CAPS
50000.0000 [IU] | ORAL_CAPSULE | ORAL | 0 refills | Status: DC
  Filled 2021-07-14: qty 4, 28d supply, fill #0
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
## 2021-07-14 NOTE — Progress Notes (Signed)
Orthopedic Tech Progress Note Patient Details:  Annette Richard 01/20/1956 [id number]  Ortho Devices Type of Ortho Device: Crutches Ortho Device/Splint Interventions: Ordered, Adjustment      Reyah Streeter A Shylo Dillenbeck 07/14/2021, 11:19 AM

## 2021-07-14 NOTE — TOC Transition Note (Signed)
Transition of Care [facility]) - CM/SW Discharge Note   Patient Details  Name: Annette Richard MRN: [id number] Date of Birth: 04-21-1956  Transition of [facility]) CM/SW Contact:  Ella Bodo, RN Phone Number: 07/14/2021, 12:31 PM   Clinical Narrative:    Pt medically stable for discharge home today with family to provide 24h supervision at discharge.  Notified [place] of need for WC in addition to 3 in 1 and RW.  Bedside nurse to order crutches from ortho tech.  Referrals have been made to [place] [facility]. For continues PT/OT.  Reviewed dc arrangements with patient, and she is in agreement.     Final next level of care: OP Rehab Barriers to Discharge: Barriers Resolved   Patient Goals and CMS Choice Patient states their goals for this hospitalization and ongoing recovery are:: to feel better               Discharge Plan and Services   Discharge Planning Services: CM Consult, Follow-up appt scheduled            DME Arranged: 3-N-1, Walker rolling, Wheelchair manual, Crutches DME Agency: [employer] Date DME Agency Contacted: 07/14/21 Time DME Agency Contacted: 1200 Representative spoke with at DME Agency: [employer] ([place]) Interventions     Readmission Risk Interventions No flowsheet data found.  Reinaldo Raddle, RN, BSN  Trauma/Neuro ICU Case Manager [phone]
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
## 2021-07-17 ENCOUNTER — Encounter: Payer: Self-pay | Admitting: Emergency Medicine

## 2021-07-27 ENCOUNTER — Other Ambulatory Visit (HOSPITAL_COMMUNITY): Payer: Self-pay

## 2021-07-27 ENCOUNTER — Telehealth (HOSPITAL_COMMUNITY): Payer: Self-pay

## 2021-07-27 NOTE — Telephone Encounter (Signed)
Transitions of Care Pharmacy   Call attempted for a pharmacy transitions of care follow-up. Unable to leave voicemail.  Call attempt #1. Will follow-up in 2-3 days.    

## 2021-07-28 ENCOUNTER — Inpatient Hospital Stay: Payer: 59 | Admitting: Internal Medicine

## 2021-08-02 ENCOUNTER — Telehealth (HOSPITAL_COMMUNITY): Payer: Self-pay

## 2021-08-02 NOTE — Telephone Encounter (Signed)
Transitions of Care Pharmacy   Call attempted for a pharmacy transitions of care follow-up. HIPAA appropriate voicemail was left with call back information provided.   Call attempt #2. Will follow-up in 2-3 days.    

## 2021-08-03 ENCOUNTER — Telehealth (HOSPITAL_COMMUNITY): Payer: Self-pay

## 2021-08-03 NOTE — Telephone Encounter (Signed)
Transitions of Care Pharmacy   Call attempted for a pharmacy transitions of care follow-up. HIPAA appropriate voicemail was left with call back information provided.   Call attempt #3. Will no longer attempt follow up for TOC pharmacy.   

## 2021-08-11 ENCOUNTER — Encounter (HOSPITAL_COMMUNITY): Payer: Self-pay | Admitting: Radiology

## 2021-09-04 ENCOUNTER — Inpatient Hospital Stay: Payer: Self-pay | Admitting: Internal Medicine

## 2021-09-14 ENCOUNTER — Ambulatory Visit: Payer: 59 | Attending: Internal Medicine | Admitting: Internal Medicine

## 2021-09-14 ENCOUNTER — Other Ambulatory Visit: Payer: Self-pay

## 2021-09-14 ENCOUNTER — Encounter: Payer: Self-pay | Admitting: Internal Medicine

## 2021-09-14 VITALS — BP 136/86 | HR 82 | Ht 66.5 in | Wt 161.6 lb

## 2021-09-14 DIAGNOSIS — T8130XA Disruption of wound, unspecified, initial encounter: Secondary | ICD-10-CM | POA: Diagnosis not present

## 2021-09-14 DIAGNOSIS — Z7689 Persons encountering health services in other specified circumstances: Secondary | ICD-10-CM

## 2021-09-14 DIAGNOSIS — N6322 Unspecified lump in the left breast, upper inner quadrant: Secondary | ICD-10-CM | POA: Diagnosis not present

## 2021-09-14 DIAGNOSIS — D649 Anemia, unspecified: Secondary | ICD-10-CM

## 2021-09-14 DIAGNOSIS — F17200 Nicotine dependence, unspecified, uncomplicated: Secondary | ICD-10-CM

## 2021-09-14 DIAGNOSIS — Z23 Encounter for immunization: Secondary | ICD-10-CM

## 2021-09-14 DIAGNOSIS — I700 Atherosclerosis of aorta: Secondary | ICD-10-CM | POA: Insufficient documentation

## 2021-09-14 DIAGNOSIS — I10 Essential (primary) hypertension: Secondary | ICD-10-CM | POA: Diagnosis not present

## 2021-09-14 DIAGNOSIS — T8133XA Disruption of traumatic injury wound repair, initial encounter: Secondary | ICD-10-CM

## 2021-09-14 DIAGNOSIS — F17210 Nicotine dependence, cigarettes, uncomplicated: Secondary | ICD-10-CM | POA: Diagnosis not present

## 2021-09-14 DIAGNOSIS — Z803 Family history of malignant neoplasm of breast: Secondary | ICD-10-CM

## 2021-09-14 DIAGNOSIS — Z1159 Encounter for screening for other viral diseases: Secondary | ICD-10-CM

## 2021-09-14 DIAGNOSIS — E559 Vitamin D deficiency, unspecified: Secondary | ICD-10-CM

## 2021-09-14 DIAGNOSIS — Z1211 Encounter for screening for malignant neoplasm of colon: Secondary | ICD-10-CM

## 2021-09-14 MED ORDER — SULFAMETHOXAZOLE-TRIMETHOPRIM 800-160 MG PO TABS: 1.0000 | ORAL_TABLET | Freq: Two times a day (BID) | ORAL | 0 refills | Status: DC

## 2021-09-14 MED ORDER — ATORVASTATIN CALCIUM 10 MG PO TABS: 10.0000 mg | ORAL_TABLET | Freq: Every day | ORAL | 1 refills | Status: DC

## 2021-09-14 MED ORDER — AMLODIPINE BESYLATE 5 MG PO TABS: 5.0000 mg | ORAL_TABLET | Freq: Every day | ORAL | 5 refills | Status: DC
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
## 2021-09-14 NOTE — Progress Notes (Signed)
Scab on forehead since Oct. And leaking fluid yellow and sometimes it hurts.

## 2021-09-15 LAB — HEPATIC FUNCTION PANEL
ALT: 7 IU/L (ref 0–32)
AST: 17 IU/L (ref 0–40)
Albumin: 4.5 g/dL (ref 3.8–4.8)
Alkaline Phosphatase: 159 IU/L — ABNORMAL HIGH (ref 44–121)
Bilirubin Total: 0.3 mg/dL (ref 0.0–1.2)
Bilirubin, Direct: 0.11 mg/dL (ref 0.00–0.40)
Total Protein: 7.2 g/dL (ref 6.0–8.5)

## 2021-09-15 LAB — CBC
Hematocrit: 42.9 % (ref 34.0–46.6)
Hemoglobin: 13.9 g/dL (ref 11.1–15.9)
MCH: 28.4 pg (ref 26.6–33.0)
MCHC: 32.4 g/dL (ref 31.5–35.7)
MCV: 88 fL (ref 79–97)
Platelets: 354 10*3/uL (ref 150–450)
RBC: 4.9 x10E6/uL (ref 3.77–5.28)
RDW: 13.3 % (ref 11.7–15.4)
WBC: 8 10*3/uL (ref 3.4–10.8)

## 2021-09-15 LAB — HCV AB W REFLEX TO QUANT PCR: HCV Ab: 0.1 s/co ratio (ref 0.0–0.9)

## 2021-09-15 LAB — LIPID PANEL
Chol/HDL Ratio: 5.3 ratio — ABNORMAL HIGH (ref 0.0–4.4)
Cholesterol, Total: 254 mg/dL — ABNORMAL HIGH (ref 100–199)
HDL: 48 mg/dL (ref >39)
LDL Chol Calc (NIH): 173 mg/dL — ABNORMAL HIGH (ref 0–99)
Triglycerides: 176 mg/dL — ABNORMAL HIGH (ref 0–149)
VLDL Cholesterol Cal: 33 mg/dL (ref 5–40)

## 2021-09-15 LAB — VITAMIN D 25 HYDROXY (VIT D DEFICIENCY, FRACTURES): Vit D, 25-Hydroxy: 27.9 ng/mL — ABNORMAL LOW (ref 30.0–100.0)

## 2021-09-15 LAB — HCV INTERPRETATION

## 2021-09-21 DIAGNOSIS — R928 Other abnormal and inconclusive findings on diagnostic imaging of breast: Secondary | ICD-10-CM | POA: Diagnosis not present

## 2021-09-21 DIAGNOSIS — R921 Mammographic calcification found on diagnostic imaging of breast: Secondary | ICD-10-CM | POA: Diagnosis not present

## 2021-09-26 DIAGNOSIS — Z1211 Encounter for screening for malignant neoplasm of colon: Secondary | ICD-10-CM | POA: Diagnosis not present

## 2021-09-27 ENCOUNTER — Encounter: Payer: Self-pay | Admitting: Internal Medicine

## 2021-09-27 NOTE — Progress Notes (Signed)
Receive breast ultrasound report from [facility] mammography.  This was done on the left breast.  She was found to have a 2.1 cm x 1.9 cm x 1.8 cm irregular mass with a spiculated margin in the left breast at 1230 o'clock position.  Radiologist states this is highly suspicious for malignancy.  Ultrasound-guided breast biopsy recommended and is planned.

## 2021-09-29 ENCOUNTER — Encounter: Payer: Self-pay | Admitting: Internal Medicine

## 2021-09-29 DIAGNOSIS — C50812 Malignant neoplasm of overlapping sites of left female breast: Secondary | ICD-10-CM | POA: Diagnosis not present

## 2021-10-03 ENCOUNTER — Encounter: Payer: Self-pay | Admitting: Internal Medicine

## 2021-10-04 ENCOUNTER — Telehealth: Payer: Self-pay | Admitting: Hematology and Oncology

## 2021-10-04 ENCOUNTER — Encounter: Payer: Self-pay | Admitting: Internal Medicine

## 2021-10-04 DIAGNOSIS — C50919 Malignant neoplasm of unspecified site of unspecified female breast: Secondary | ICD-10-CM | POA: Insufficient documentation

## 2021-10-04 LAB — COLOGUARD: COLOGUARD: NEGATIVE

## 2021-10-04 NOTE — Telephone Encounter (Signed)
Left a message for patient to call me back to confirm clinic appointment for 2/1

## 2021-10-09 ENCOUNTER — Telehealth: Payer: Self-pay | Admitting: Hematology and Oncology

## 2021-10-09 ENCOUNTER — Encounter: Payer: Self-pay | Admitting: Internal Medicine

## 2021-10-09 NOTE — Telephone Encounter (Signed)
Called patient a second time and still no answer, left call back information and referenced upcoming appointment on 2/1

## 2021-10-10 ENCOUNTER — Telehealth: Payer: Self-pay | Admitting: Hematology and Oncology

## 2021-10-10 NOTE — Telephone Encounter (Signed)
Spoke to patient to confirm morning clinic appointment for 2/1, [facility] will send paperwork

## 2021-10-12 ENCOUNTER — Ambulatory Visit: Payer: Self-pay | Admitting: Pharmacist

## 2021-10-16 ENCOUNTER — Encounter: Payer: Self-pay | Admitting: *Deleted

## 2021-10-16 ENCOUNTER — Other Ambulatory Visit: Payer: Self-pay | Admitting: *Deleted

## 2021-10-16 DIAGNOSIS — Z170 Estrogen receptor positive status [ER+]: Secondary | ICD-10-CM

## 2021-10-16 DIAGNOSIS — C50412 Malignant neoplasm of upper-outer quadrant of left female breast: Secondary | ICD-10-CM | POA: Insufficient documentation
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
## 2021-10-16 NOTE — Progress Notes (Signed)
error 

## 2021-10-18 ENCOUNTER — Encounter: Payer: Self-pay | Admitting: Genetic Counselor

## 2021-10-18 ENCOUNTER — Ambulatory Visit: Payer: Self-pay | Admitting: Surgery

## 2021-10-18 ENCOUNTER — Encounter: Payer: Self-pay | Admitting: Hematology and Oncology

## 2021-10-18 ENCOUNTER — Other Ambulatory Visit: Payer: Self-pay

## 2021-10-18 ENCOUNTER — Inpatient Hospital Stay: Payer: 59 | Attending: Hematology and Oncology

## 2021-10-18 ENCOUNTER — Ambulatory Visit: Payer: 59 | Attending: Surgery | Admitting: Physical Therapy

## 2021-10-18 ENCOUNTER — Inpatient Hospital Stay: Payer: 59 | Admitting: Licensed Clinical Social Worker

## 2021-10-18 ENCOUNTER — Inpatient Hospital Stay (HOSPITAL_BASED_OUTPATIENT_CLINIC_OR_DEPARTMENT_OTHER): Payer: 59 | Admitting: Hematology and Oncology

## 2021-10-18 ENCOUNTER — Encounter: Payer: Self-pay | Admitting: Physical Therapy

## 2021-10-18 ENCOUNTER — Ambulatory Visit (HOSPITAL_BASED_OUTPATIENT_CLINIC_OR_DEPARTMENT_OTHER): Payer: 59 | Admitting: Genetic Counselor

## 2021-10-18 ENCOUNTER — Ambulatory Visit
Admission: RE | Admit: 2021-10-18 | Discharge: 2021-10-18 | Disposition: A | Discharge status: Home / Self Care | Payer: 59 | Source: Ambulatory Visit | Attending: Radiation Oncology | Admitting: Radiation Oncology

## 2021-10-18 DIAGNOSIS — C50412 Malignant neoplasm of upper-outer quadrant of left female breast: Secondary | ICD-10-CM

## 2021-10-18 DIAGNOSIS — F17210 Nicotine dependence, cigarettes, uncomplicated: Secondary | ICD-10-CM | POA: Insufficient documentation

## 2021-10-18 DIAGNOSIS — Z170 Estrogen receptor positive status [ER+]: Secondary | ICD-10-CM

## 2021-10-18 DIAGNOSIS — R69 Illness, unspecified: Secondary | ICD-10-CM | POA: Diagnosis not present

## 2021-10-18 DIAGNOSIS — Z803 Family history of malignant neoplasm of breast: Secondary | ICD-10-CM

## 2021-10-18 DIAGNOSIS — Z800 Family history of malignant neoplasm of digestive organs: Secondary | ICD-10-CM

## 2021-10-18 DIAGNOSIS — R293 Abnormal posture: Secondary | ICD-10-CM | POA: Diagnosis not present

## 2021-10-18 DIAGNOSIS — R262 Difficulty in walking, not elsewhere classified: Secondary | ICD-10-CM

## 2021-10-18 DIAGNOSIS — C50912 Malignant neoplasm of unspecified site of left female breast: Secondary | ICD-10-CM

## 2021-10-18 LAB — CBC WITH DIFFERENTIAL (CANCER CENTER ONLY)
Abs Immature Granulocytes: 0.03 10*3/uL (ref 0.00–0.07)
Basophils Absolute: 0.1 10*3/uL (ref 0.0–0.1)
Basophils Relative: 1 %
Eosinophils Absolute: 0.3 10*3/uL (ref 0.0–0.5)
Eosinophils Relative: 3 %
HCT: 40.4 % (ref 36.0–46.0)
Hemoglobin: 13.3 g/dL (ref 12.0–15.0)
Immature Granulocytes: 0 %
Lymphocytes Relative: 27 %
Lymphs Abs: 2.3 10*3/uL (ref 0.7–4.0)
MCH: 28.5 pg (ref 26.0–34.0)
MCHC: 32.9 g/dL (ref 30.0–36.0)
MCV: 86.5 fL (ref 80.0–100.0)
Monocytes Absolute: 0.5 10*3/uL (ref 0.1–1.0)
Monocytes Relative: 5 %
Neutro Abs: 5.4 10*3/uL (ref 1.7–7.7)
Neutrophils Relative %: 64 %
Platelet Count: 325 10*3/uL (ref 150–400)
RBC: 4.67 MIL/uL (ref 3.87–5.11)
RDW: 13.5 % (ref 11.5–15.5)
WBC Count: 8.5 10*3/uL (ref 4.0–10.5)
nRBC: 0 % (ref 0.0–0.2)

## 2021-10-18 LAB — CMP (CANCER CENTER ONLY)
ALT: 12 U/L (ref 0–44)
AST: 14 U/L — ABNORMAL LOW (ref 15–41)
Albumin: 4.3 g/dL (ref 3.5–5.0)
Alkaline Phosphatase: 118 U/L (ref 38–126)
Anion gap: 8 (ref 5–15)
BUN: 5 mg/dL — ABNORMAL LOW (ref 8–23)
CO2: 27 mmol/L (ref 22–32)
Calcium: 9.7 mg/dL (ref 8.9–10.3)
Chloride: 104 mmol/L (ref 98–111)
Creatinine: 0.89 mg/dL (ref 0.44–1.00)
GFR, Estimated: >60 mL/min (ref >60)
Glucose, Bld: 95 mg/dL (ref 70–99)
Potassium: 3.7 mmol/L (ref 3.5–5.1)
Sodium: 139 mmol/L (ref 135–145)
Total Bilirubin: 0.5 mg/dL (ref 0.3–1.2)
Total Protein: 7.8 g/dL (ref 6.5–8.1)

## 2021-10-18 LAB — GENETIC SCREENING ORDER
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
## 2021-10-18 NOTE — Progress Notes (Signed)
REFERRING PROVIDER: Benay Pike, MD [place], [place] [address]  PRIMARY PROVIDER:  Ladell Pier, MD  PRIMARY REASON FOR VISIT:  1. Malignant neoplasm of upper-outer quadrant of left breast in female, estrogen receptor positive ([place])   2. Family history of pancreatic cancer   3. Family history of breast cancer     HISTORY OF PRESENT ILLNESS:   Ms. Sheets, a 66 y.o. female, was seen for a [place] cancer genetics consultation during the breast multidisciplinary clinic at the request of Dr. Chryl Heck due to a personal and family history of cancer.  Ms. Gavel presents to clinic today to discuss the possibility of a hereditary predisposition to cancer, to discuss genetic testing, and to further clarify her future cancer risks, as well as potential cancer risks for family members.   In January 2023, at the age of 69, Ms. Burruel was diagnosed with invasive ductal carcinoma of the left breast. The treatment plan is lumpectomy followed by adjuvant radiation and oncotype testing.   CANCER HISTORY:  Oncology History  Malignant neoplasm of upper-outer quadrant of left breast in female, estrogen receptor positive ([place])  10/16/2021 Initial Diagnosis   Malignant neoplasm of upper-outer quadrant of left breast in female, estrogen receptor positive ([place])   10/18/2021 Cancer Staging   Staging form: Breast, AJCC 8th Edition - Clinical: Stage IB (cT2, cN0, cM0, G2, ER+, PR+, HER2-) - Signed by Hayden Pedro, PA-C on 10/18/2021 Method of lymph node assessment: Clinical Histologic grading system: 3 grade system      RISK FACTORS:  Menarche was at age 17.  First live birth at age 61.  OCP use for approximately 1 year.  Ovaries intact: right salpingoophorectomy Uterus intact: no.  Menopausal status: postmenopausal.  HRT use: 6 months Colonoscopy: yes Mammogram within the last year: yes.  Past Medical History:  Diagnosis Date   Arthritis    Breast  cancer ([place])     Past Surgical History:  Procedure Laterality Date   ABDOMINAL HYSTERECTOMY     APPENDECTOMY     appynedectomy     CHOLECYSTECTOMY     FACIAL LACERATION REPAIR N/A 07/10/2021   Procedure: FACIAL LACERATION REPAIR;  Surgeon: Jesusita Oka, MD;  Location: [place] [place];  Service: General;  Laterality: N/A;   HIP CLOSED REDUCTION Left 07/10/2021   Procedure: ATTEMPTED CLOSED REDUCTION HIP, OPEN REDUCTION LEFT HIP;  Surgeon: Shona Needles, MD;  Location: [place] [place];  Service: Orthopedics;  Laterality: Left;   KNEE ARTHROSCOPY WITH MENISCAL REPAIR Right    KNEE SURGERY      Social History   Socioeconomic History   Marital status: Widowed    Spouse name: Not on file   Number of children: 2   Years of education: Not on file   Highest education level: Not on file  Occupational History   Occupation: Retired LPN  Tobacco Use   Smoking status: Every Day    Packs/day: 1.00    Years: 15.00    Pack years: 15.00    Types: Cigarettes   Smokeless tobacco: Never  Vaping Use   Vaping Use: Never used  Substance and Sexual Activity   Alcohol use: Not Currently   Drug use: Never   Sexual activity: Never    Birth control/protection: Abstinence  Other Topics Concern   Not on file  Social History Narrative   ** Merged History Encounter **       Social Determinants of Health   Financial Resource Strain: High Risk  Difficulty of Paying Living Expenses: Hard  Food Insecurity: Food Insecurity Present   Worried About Charity fundraiser in the Last Year: Often true   Ran Out of Food in the Last Year: Sometimes true  Transportation Needs: Unmet Transportation Needs   Lack of Transportation (Medical): No   Lack of Transportation (Non-Medical): Yes  Physical Activity: Not on file  Stress: Not on file  Social Connections: Not on file     FAMILY HISTORY:  We obtained a detailed, 4-generation family history.  Significant diagnoses are listed below: Family History  Problem  Relation Age of Onset   Pancreatic cancer Mother 25   Mesothelioma Father 33   Breast cancer Sister 59       reports negative genetic testing   Colon polyps Sister        precancerous   Breast cancer Sister        dx. 104s   Breast cancer Maternal Grandmother    CVA Maternal Grandfather       Ms. Freeman reports a sister diagnosed with breast cancer in her 58s and a second sister diagnosed with breast cancer at age 65. She believes the sister diagnosed with breast cancer at age 38 had negative genetic testing. Her mother was diagnosed with pancreatic cancer at age 1, she died at 39. Her maternal uncle was diagnosed with throat cancer, he is deceased. Her maternal great grandmother was diagnosed with breast cancer, she is deceased. Ms. Homeyer father was diagnosed with mesothelioma at age 40, he died at age 51. There is no reported Ashkenazi Jewish ancestry.  GENETIC COUNSELING ASSESSMENT: Ms. Kong is a 66 y.o. female with a personal and family history of cancer which is somewhat suggestive of a hereditary cancer syndrome and predisposition to cancer given multiple family members diagnosed with breast cancer. We, therefore, discussed and recommended the following at today's visit.   DISCUSSION: We discussed that 5 - 10% of cancer is hereditary, with most cases of hereditary breast cancer associated with mutations in BRCA1/2.  There are other genes that can be associated with hereditary breast and pancreatic cancer syndromes. Type of cancer risk and level of risk are gene-specific. We discussed that testing is beneficial for several reasons including knowing how to follow individuals after completing their treatment, identifying whether potential treatment options would be beneficial, and understanding if other family members could be at risk for cancer and allowing them to undergo genetic testing.   We reviewed the characteristics, features and inheritance patterns of hereditary cancer  syndromes. We also discussed genetic testing, including the appropriate family members to test, the process of testing, insurance coverage and turn-around-time for results. We discussed the implications of a negative, positive and/or variant of uncertain significant result. In order to get genetic test results in a timely manner so that Ms. Kosanke can use these genetic test results for surgical decisions, we recommended Ms. Sokolowski pursue genetic testing for the BRCAplus. Once complete, we recommend Ms. Newby pursue reflex genetic testing to a more comprehensive gene panel.   Ms. Marcelino  was offered a common hereditary cancer panel (47 genes) and an expanded pan-cancer panel (77 genes). Ms. Starzyk was informed of the benefits and limitations of each panel, including that expanded pan-cancer panels contain genes that do not have clear management guidelines at this point in time.  We also discussed that as the number of genes included on a panel increases, the chances of variants of uncertain significance increases.  After considering the  benefits and limitations of each gene panel, Ms. Gilman  elected to have [facility] CancerNext-Expanded Panel.  The CancerNext-Expanded gene panel offered by [facility] and includes sequencing, rearrangement, and RNA analysis for the following 77 genes: AIP, ALK, APC, ATM, AXIN2, BAP1, BARD1, BLM, BMPR1A, BRCA1, BRCA2, BRIP1, CDC73, CDH1, CDK4, CDKN1B, CDKN2A, CHEK2, CTNNA1, DICER1, FANCC, FH, FLCN, GALNT12, KIF1B, LZTR1, MAX, MEN1, MET, MLH1, MSH2, MSH3, MSH6, MUTYH, NBN, NF1, NF2, NTHL1, PALB2, PHOX2B, PMS2, POT1, PRKAR1A, PTCH1, PTEN, RAD51C, RAD51D, RB1, RECQL, RET, SDHA, SDHAF2, SDHB, SDHC, SDHD, SMAD4, SMARCA4, SMARCB1, SMARCE1, STK11, SUFU, TMEM127, TP53, TSC1, TSC2, VHL and XRCC2 (sequencing and deletion/duplication); EGFR, EGLN1, HOXB13, KIT, MITF, PDGFRA, POLD1, and POLE (sequencing only); EPCAM and GREM1 (deletion/duplication only).   Based on Ms.  Rennels's personal and family history of cancer, she meets medical criteria for genetic testing. She is very concerned about the cost of the genetic testing. We will work with the laboratory, [facility], to ensure there is no cost and order [place] testing, if needed.  PLAN: After considering the risks, benefits, and limitations, Ms. Nebel provided informed consent to pursue genetic testing and the blood sample was sent to [facility] for analysis of the CancerNext-Expanded Panel. Results should be available within approximately 1-2 weeks' time, at which point they will be disclosed by telephone to Ms. Ousley, as will any additional recommendations warranted by these results. Ms. Mcnew will receive a summary of her genetic counseling visit and a copy of her results once available. This information will also be available in Epic.   Ms. Zaragosa questions were answered to her satisfaction today. Our contact information was provided should additional questions or concerns arise. Thank you for the referral and allowing [place] to share in the care of your patient.   Lucille Passy, MS, [facility] Genetic Counselor [email or web address] (P) [phone]  The patient was seen for a total of 20 minutes in face-to-face genetic counseling.  The patient was seen alone.  Drs. Lindi Adie and/or Burr Medico were available to discuss this case as needed.  _______________________________________________________________________ For Office Staff:  Number of people involved in session: 1 Was an Intern/ student involved with case: no

## 2021-10-18 NOTE — Assessment & Plan Note (Signed)
This is a very pleasant 66 year old postmenopausal female patient with incidentally diagnosed left breast invasive ductal carcinoma grade 1-2, ER/PR strongly positive, HER2 negative Ki-67 of 5% referred to breast [place] for additional recommendations.  Given ER/PR strongly positive and relatively small size tumor with no associated lymph nodes, we agreed to proceed with lumpectomy first followed by adjuvant radiation and Oncotype testing.  We have discussed about Oncotype Dx score which is a well validated prognostic scoring system which can predict outcome with endocrine therapy alone and whether chemotherapy reduces recurrence.  Typically in patients with ER positive cancers that are node negative if the RS score is high typically greater than or equal to 26, chemotherapy is recommended.  In women with intermediate recurrence score younger than 100, there can still be some role for chemotherapy in addition to endocrine therapy especially if the recurrence score is between 21-25. If chemotherapy is needed, this will precede radiation and then after radiation she will continue on antiestrogen therapy. Discussed about genetic testing, continuing lifestyle interventions such as regular exercise, healthy diet with more focus on plant-based diet, limiting alcohol intake and avoiding any hormone replacement therapy.  We have discussed about antiestrogen therapy as well.  We have discussed about both tamoxifen and aromatase inhibitors.  I discussed mechanism of action of each, adverse effects.  With tamoxifen we have discussed about increased postmenopausal symptoms, DVT/PE especially since she is an active smoker, no risk of endometrial cancer to her since she had total hysterectomy.  We have discussed about possibly increased risk of cardiovascular events.  The benefit from tamoxifen would be improvement in bone density. With aromatase inhibitors we have once again discussed mechanism of action, adverse effects from  aromatase inhibitors including arthralgias, myalgias, increased bone loss, other postmenopausal symptoms. 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  She will return to clinic around 3 weeks after surgery to discuss Oncotype results.
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
## 2021-10-19 ENCOUNTER — Encounter: Payer: Self-pay | Admitting: Genetic Counselor

## 2021-10-23 ENCOUNTER — Encounter: Payer: Self-pay | Admitting: Licensed Clinical Social Worker

## 2021-10-23 NOTE — Progress Notes (Signed)
Annette Richard CSW Progress Note  Patient brought in completed [place] in [place] applications. CSW submitted today with accompanying medical information. Both organizations will contact pt directly regarding assistance.     Christeen Douglas , LCSW

## 2021-10-25 ENCOUNTER — Encounter: Payer: Self-pay | Admitting: Pharmacist

## 2021-10-25 ENCOUNTER — Ambulatory Visit: Payer: 59 | Attending: Internal Medicine | Admitting: Pharmacist

## 2021-10-25 ENCOUNTER — Telehealth: Payer: Self-pay | Admitting: Pharmacist

## 2021-10-25 DIAGNOSIS — I700 Atherosclerosis of aorta: Secondary | ICD-10-CM

## 2021-10-25 MED ORDER — ATORVASTATIN CALCIUM 10 MG PO TABS: 10.0000 mg | ORAL_TABLET | Freq: Every day | ORAL | 3 refills | Status: DC

## 2021-10-25 NOTE — Progress Notes (Signed)
° °  S:    PCP: Dr. Wynetta Emery  Patient arrives in good spirits. Presents to the clinic for hypertension evaluation, counseling, and management. Patient was referred and last seen by Primary Care Provider on 09/14/2021. BP at that visit was 136/86. She reported taking BP medications in the past at that visit but stopped these due to lack of insurance. Reported home BP in the 130-140/80-90 range. Of note, she has a hx of atherosclerosis found incidentally on CT chest. Amlodipine 5 mg daily was started.   Medication adherence reported. Did not take medication this morning. Denies any side effects.   Pt endorses occasional HA. Denies chest pain, dyspnea. Denies LE.   Current BP Medications include:  amlodipine 5 mg daily  Antihypertensives tried in the past include: NKDA, no other medications tried   Dietary habits include: compliant with salt restriction. Increased intake of fruits. Drinks coffee and tea. Drinks all throughout her day.  Exercise habits include: limited d/t knee pain Family / Social history:  Fhx: HTN, stroke Tobacco: current 1 PPD smoker but trying to decrease  Alcohol: none   O:  Vitals:   10/25/21 1039  BP: 135/87  Pulse: 66    Home BP readings: 130-138/low 80s. Gives reading taken 133/83   Last 3 Office BP readings: BP Readings from Last 3 Encounters:  10/25/21 135/87  10/18/21 133/83  09/14/21 136/86    BMET    Component Value Date/Time   NA 139 10/18/2021 0816   NA 142 02/14/2014 0359   K 3.7 10/18/2021 0816   K 3.9 02/14/2014 0359   CL 104 10/18/2021 0816   CL 112 (H) 02/14/2014 0359   CO2 27 10/18/2021 0816   CO2 29 02/14/2014 0359   GLUCOSE 95 10/18/2021 0816   GLUCOSE 83 02/14/2014 0359   BUN 5 (L) 10/18/2021 0816   BUN 7 02/14/2014 0359   CREATININE 0.89 10/18/2021 0816   CREATININE 0.90 02/14/2014 0359   CALCIUM 9.7 10/18/2021 0816   CALCIUM 8.2 (L) 02/14/2014 0359   GFRNONAA >60 10/18/2021 0816   GFRNONAA >60 02/14/2014 0359   GFRAA >60  06/27/2016 0522   GFRAA >60 02/14/2014 0359    Renal function: Estimated Creatinine Clearance: 64.9 mL/min (by C-G formula based on SCr of 0.89 mg/dL).  Clinical ASCVD: YES  A/P: Hypertension longstanding currently close to goal on current medications. BP Goal = < 130/80 mmHg. Medication adherence reported, however, she did not take her amlodipine this morning.  - No  changes today.  -Counseled on lifestyle modifications for blood pressure control including reduced dietary sodium, increased exercise, adequate sleep.  Results reviewed and written information provided.   Total time in face-to-face counseling 20 minutes.   F/U Clinic Visit in April with Dr. Wynetta Emery.  Benard Halsted, PharmD, [facility] [place], [place] [phone]

## 2021-10-26 ENCOUNTER — Telehealth: Payer: Self-pay | Admitting: *Deleted

## 2021-10-26 ENCOUNTER — Encounter: Payer: Self-pay | Admitting: *Deleted

## 2021-10-26 NOTE — Telephone Encounter (Signed)
Spoke to pt concerning [place] from 2.1.23. Denies questions or concerns regarding dx or treatment care plan. Encourage pt to call with needs. Received verbal understanding. Provided educations on compression bra after surgery.

## 2021-11-08 ENCOUNTER — Encounter (HOSPITAL_BASED_OUTPATIENT_CLINIC_OR_DEPARTMENT_OTHER): Payer: Self-pay | Admitting: Surgery

## 2021-11-08 ENCOUNTER — Other Ambulatory Visit: Payer: Self-pay

## 2021-11-13 ENCOUNTER — Telehealth: Payer: Self-pay | Admitting: Genetic Counselor

## 2021-11-13 ENCOUNTER — Encounter: Payer: Self-pay | Admitting: Genetic Counselor

## 2021-11-13 DIAGNOSIS — Z1379 Encounter for other screening for genetic and chromosomal anomalies: Secondary | ICD-10-CM | POA: Insufficient documentation

## 2021-11-13 NOTE — Telephone Encounter (Signed)
I contacted Annette Richard to discuss her genetic testing results. No pathogenic variants were identified in the 8 genes analyzed. Of note, we are still waiting on additional genes and will contact her when they are available. Detailed clinic note to follow.  The test report has been scanned into EPIC and is located under the Molecular Pathology section of the Results Review tab.  A portion of the result report is included below for reference.   Annette Passy, MS, [facility] Genetic Counselor [email or web address] (P) [phone]

## 2021-11-15 DIAGNOSIS — C50412 Malignant neoplasm of upper-outer quadrant of left female breast: Secondary | ICD-10-CM | POA: Diagnosis not present

## 2021-11-15 NOTE — Progress Notes (Signed)

## 2021-11-16 ENCOUNTER — Ambulatory Visit (HOSPITAL_BASED_OUTPATIENT_CLINIC_OR_DEPARTMENT_OTHER): Payer: 59 | Admitting: Anesthesiology

## 2021-11-16 ENCOUNTER — Encounter (HOSPITAL_BASED_OUTPATIENT_CLINIC_OR_DEPARTMENT_OTHER): Payer: Self-pay | Admitting: Surgery

## 2021-11-16 ENCOUNTER — Other Ambulatory Visit: Payer: Self-pay

## 2021-11-16 ENCOUNTER — Ambulatory Visit (HOSPITAL_BASED_OUTPATIENT_CLINIC_OR_DEPARTMENT_OTHER)
Admission: RE | Admit: 2021-11-16 | Discharge: 2021-11-16 | Disposition: A | Discharge status: Home / Self Care | Payer: 59 | Attending: Surgery | Admitting: Surgery

## 2021-11-16 ENCOUNTER — Encounter (HOSPITAL_BASED_OUTPATIENT_CLINIC_OR_DEPARTMENT_OTHER)
Admission: RE | Disposition: A | Discharge status: Home / Self Care | Payer: Self-pay | Source: Home / Self Care | Attending: Surgery

## 2021-11-16 DIAGNOSIS — M1990 Unspecified osteoarthritis, unspecified site: Secondary | ICD-10-CM | POA: Diagnosis not present

## 2021-11-16 DIAGNOSIS — I10 Essential (primary) hypertension: Secondary | ICD-10-CM | POA: Diagnosis not present

## 2021-11-16 DIAGNOSIS — K219 Gastro-esophageal reflux disease without esophagitis: Secondary | ICD-10-CM | POA: Insufficient documentation

## 2021-11-16 DIAGNOSIS — C50412 Malignant neoplasm of upper-outer quadrant of left female breast: Secondary | ICD-10-CM | POA: Diagnosis not present

## 2021-11-16 DIAGNOSIS — F17210 Nicotine dependence, cigarettes, uncomplicated: Secondary | ICD-10-CM | POA: Diagnosis not present

## 2021-11-16 DIAGNOSIS — Z170 Estrogen receptor positive status [ER+]: Secondary | ICD-10-CM | POA: Diagnosis not present

## 2021-11-16 DIAGNOSIS — C50912 Malignant neoplasm of unspecified site of left female breast: Secondary | ICD-10-CM | POA: Diagnosis not present

## 2021-11-16 HISTORY — PX: BREAST LUMPECTOMY WITH RADIOACTIVE SEED AND SENTINEL LYMPH NODE BIOPSY: SHX6550

## 2021-11-16 SURGERY — BREAST LUMPECTOMY WITH RADIOACTIVE SEED AND SENTINEL LYMPH NODE BIOPSY
Anesthesia: General | Site: Breast | Laterality: Left

## 2021-11-16 MED ORDER — CHLORHEXIDINE GLUCONATE CLOTH 2 % EX PADS
6.0000 | MEDICATED_PAD | Freq: Once | CUTANEOUS | Status: DC
Start: 2021-11-16 — End: 2021-11-16

## 2021-11-16 MED ORDER — OXYCODONE HCL 5 MG/5ML PO SOLN: 5.0000 mg | Freq: Once | ORAL | Status: DC | PRN

## 2021-11-16 MED ORDER — FENTANYL CITRATE (PF) 100 MCG/2ML IJ SOLN
INTRAMUSCULAR | Status: AC
  Filled 2021-11-16: qty 2

## 2021-11-16 MED ORDER — BUPIVACAINE LIPOSOME 1.3 % IJ SUSP
INTRAMUSCULAR | Status: DC | PRN
  Administered 2021-11-16: 10 mL via PERINEURAL

## 2021-11-16 MED ORDER — ONDANSETRON HCL 4 MG/2ML IJ SOLN: 4.0000 mg | Freq: Once | INTRAMUSCULAR | Status: DC | PRN

## 2021-11-16 MED ORDER — SODIUM CHLORIDE 0.9 % IV SOLN
INTRAVENOUS | Status: DC | PRN
  Administered 2021-11-16: 500 mL

## 2021-11-16 MED ORDER — OXYCODONE HCL 5 MG PO TABS: 5.0000 mg | ORAL_TABLET | Freq: Four times a day (QID) | ORAL | 0 refills | Status: DC | PRN

## 2021-11-16 MED ORDER — SODIUM CHLORIDE 0.9 % IV SOLN
INTRAVENOUS | Status: AC
  Filled 2021-11-16: qty 10

## 2021-11-16 MED ORDER — LIDOCAINE HCL (CARDIAC) PF 100 MG/5ML IV SOSY
PREFILLED_SYRINGE | INTRAVENOUS | Status: DC | PRN
  Administered 2021-11-16: 40 mg via INTRAVENOUS

## 2021-11-16 MED ORDER — CEFAZOLIN SODIUM-DEXTROSE 2-4 GM/100ML-% IV SOLN: 2.0000 g | INTRAVENOUS | Status: DC

## 2021-11-16 MED ORDER — ACETAMINOPHEN 500 MG PO TABS
1000.0000 mg | ORAL_TABLET | Freq: Once | ORAL | Status: AC
  Administered 2021-11-16: 1000 mg via ORAL

## 2021-11-16 MED ORDER — MIDAZOLAM HCL 2 MG/2ML IJ SOLN
INTRAMUSCULAR | Status: AC
  Filled 2021-11-16: qty 2

## 2021-11-16 MED ORDER — CHLORHEXIDINE GLUCONATE CLOTH 2 % EX PADS: 6.0000 | MEDICATED_PAD | Freq: Once | CUTANEOUS | Status: DC

## 2021-11-16 MED ORDER — IBUPROFEN 800 MG PO TABS: 800.0000 mg | ORAL_TABLET | Freq: Three times a day (TID) | ORAL | 0 refills | Status: DC | PRN

## 2021-11-16 MED ORDER — CEFAZOLIN SODIUM-DEXTROSE 2-4 GM/100ML-% IV SOLN
INTRAVENOUS | Status: AC
  Filled 2021-11-16: qty 100

## 2021-11-16 MED ORDER — FENTANYL CITRATE (PF) 100 MCG/2ML IJ SOLN
INTRAMUSCULAR | Status: DC | PRN
  Administered 2021-11-16: 50 ug via INTRAVENOUS
  Administered 2021-11-16 (×2): 25 ug via INTRAVENOUS

## 2021-11-16 MED ORDER — BUPIVACAINE HCL (PF) 0.5 % IJ SOLN
INTRAMUSCULAR | Status: DC | PRN
  Administered 2021-11-16: 20 mL via PERINEURAL

## 2021-11-16 MED ORDER — KETOROLAC TROMETHAMINE 30 MG/ML IJ SOLN: 30.0000 mg | Freq: Once | INTRAMUSCULAR | Status: DC | PRN

## 2021-11-16 MED ORDER — DEXAMETHASONE SODIUM PHOSPHATE 10 MG/ML IJ SOLN
INTRAMUSCULAR | Status: AC
  Filled 2021-11-16: qty 1

## 2021-11-16 MED ORDER — HYDROMORPHONE HCL 1 MG/ML IJ SOLN: 0.2500 mg | INTRAMUSCULAR | Status: DC | PRN

## 2021-11-16 MED ORDER — MIDAZOLAM HCL 2 MG/2ML IJ SOLN
INTRAMUSCULAR | Status: AC
Start: 2021-11-16 — End: ?
  Filled 2021-11-16: qty 2

## 2021-11-16 MED ORDER — AMISULPRIDE (ANTIEMETIC) 5 MG/2ML IV SOLN: 10.0000 mg | Freq: Once | INTRAVENOUS | Status: DC | PRN

## 2021-11-16 MED ORDER — OXYCODONE HCL 5 MG PO TABS: 5.0000 mg | ORAL_TABLET | Freq: Once | ORAL | Status: DC | PRN

## 2021-11-16 MED ORDER — ACETAMINOPHEN 500 MG PO TABS
ORAL_TABLET | ORAL | Status: AC
  Filled 2021-11-16: qty 2

## 2021-11-16 MED ORDER — BUPIVACAINE-EPINEPHRINE (PF) 0.25% -1:200000 IJ SOLN
INTRAMUSCULAR | Status: DC | PRN
  Administered 2021-11-16: 15 mL

## 2021-11-16 MED ORDER — LACTATED RINGERS IV SOLN: INTRAVENOUS | Status: DC | PRN

## 2021-11-16 MED ORDER — EPHEDRINE SULFATE (PRESSORS) 50 MG/ML IJ SOLN
INTRAMUSCULAR | Status: DC | PRN
Start: 2021-11-16 — End: 2021-11-16
  Administered 2021-11-16 (×2): 10 mg via INTRAVENOUS
  Administered 2021-11-16: 30 mg via INTRAVENOUS

## 2021-11-16 MED ORDER — PHENYLEPHRINE HCL (PRESSORS) 10 MG/ML IV SOLN
INTRAVENOUS | Status: DC | PRN
  Administered 2021-11-16 (×2): 80 ug via INTRAVENOUS

## 2021-11-16 MED ORDER — LIDOCAINE 2% (20 MG/ML) 5 ML SYRINGE
INTRAMUSCULAR | Status: AC
  Filled 2021-11-16: qty 5

## 2021-11-16 MED ORDER — PROPOFOL 10 MG/ML IV BOLUS
INTRAVENOUS | Status: AC
  Filled 2021-11-16: qty 20

## 2021-11-16 MED ORDER — MIDAZOLAM HCL 2 MG/2ML IJ SOLN
INTRAMUSCULAR | Status: DC | PRN
Start: 2021-11-16 — End: 2021-11-16
  Administered 2021-11-16: 2 mg via INTRAVENOUS

## 2021-11-16 MED ORDER — MIDAZOLAM HCL 2 MG/2ML IJ SOLN
2.0000 mg | Freq: Once | INTRAMUSCULAR | Status: AC
  Administered 2021-11-16: 2 mg via INTRAVENOUS

## 2021-11-16 MED ORDER — ONDANSETRON HCL 4 MG/2ML IJ SOLN
INTRAMUSCULAR | Status: AC
  Filled 2021-11-16: qty 2

## 2021-11-16 MED ORDER — MAGTRACE LYMPHATIC TRACER
INTRAMUSCULAR | Status: DC | PRN
  Administered 2021-11-16: 2 mL via INTRAMUSCULAR

## 2021-11-16 MED ORDER — CEFAZOLIN SODIUM-DEXTROSE 2-3 GM-%(50ML) IV SOLR
INTRAVENOUS | Status: DC | PRN
  Administered 2021-11-16: 2 g via INTRAVENOUS

## 2021-11-16 MED ORDER — ONDANSETRON HCL 4 MG/2ML IJ SOLN
INTRAMUSCULAR | Status: DC | PRN
  Administered 2021-11-16: 4 mg via INTRAVENOUS

## 2021-11-16 MED ORDER — DEXAMETHASONE SODIUM PHOSPHATE 10 MG/ML IJ SOLN
INTRAMUSCULAR | Status: DC | PRN
  Administered 2021-11-16: 10 mg via INTRAVENOUS

## 2021-11-16 MED ORDER — PROPOFOL 10 MG/ML IV BOLUS
INTRAVENOUS | Status: DC | PRN
  Administered 2021-11-16: 150 mg via INTRAVENOUS

## 2021-11-16 MED ORDER — LACTATED RINGERS IV SOLN: INTRAVENOUS | Status: DC

## 2021-11-16 MED ORDER — FENTANYL CITRATE (PF) 100 MCG/2ML IJ SOLN
100.0000 ug | Freq: Once | INTRAMUSCULAR | Status: AC
  Administered 2021-11-16: 100 ug via INTRAVENOUS

## 2021-11-16 SURGICAL SUPPLY — 48 items
ADH SKN CLS APL DERMABOND .7 (GAUZE/BANDAGES/DRESSINGS) ×1
APL PRP STRL LF DISP 70% ISPRP (MISCELLANEOUS) ×1
APPLIER CLIP 9.375 MED OPEN (MISCELLANEOUS) ×2
APR CLP MED 9.3 20 MLT OPN (MISCELLANEOUS) ×1
BLADE SURG 15 STRL LF DISP TIS (BLADE) ×1 IMPLANT
BLADE SURG 15 STRL SS (BLADE) ×2
CANISTER SUC SOCK COL 7IN (MISCELLANEOUS) IMPLANT
CANISTER SUCT 1200ML W/VALVE (MISCELLANEOUS) ×2 IMPLANT
CHLORAPREP W/TINT 26 (MISCELLANEOUS) ×2 IMPLANT
CLIP APPLIE 9.375 MED OPEN (MISCELLANEOUS) ×1 IMPLANT
COVER BACK TABLE 60X90IN (DRAPES) ×2 IMPLANT
COVER MAYO STAND STRL (DRAPES) ×2 IMPLANT
COVER PROBE W GEL 5X96 (DRAPES) ×2 IMPLANT
DERMABOND ADVANCED (GAUZE/BANDAGES/DRESSINGS) ×1
DERMABOND ADVANCED .7 DNX12 (GAUZE/BANDAGES/DRESSINGS) ×1 IMPLANT
DRAPE LAPAROSCOPIC ABDOMINAL (DRAPES) ×2 IMPLANT
DRAPE UTILITY XL STRL (DRAPES) ×2 IMPLANT
ELECT COATED BLADE 2.86 ST (ELECTRODE) ×2 IMPLANT
ELECT REM PT RETURN 9FT ADLT (ELECTROSURGICAL) ×2
ELECTRODE REM PT RTRN 9FT ADLT (ELECTROSURGICAL) ×1 IMPLANT
GLOVE SRG 8 PF TXTR STRL LF DI (GLOVE) ×1 IMPLANT
GLOVE SURG LTX SZ8 (GLOVE) ×2 IMPLANT
GLOVE SURG POLYISO LF SZ6.5 (GLOVE) ×1 IMPLANT
GLOVE SURG POLYISO LF SZ7 (GLOVE) ×1 IMPLANT
GLOVE SURG UNDER POLY LF SZ6.5 (GLOVE) ×1 IMPLANT
GLOVE SURG UNDER POLY LF SZ7 (GLOVE) ×1 IMPLANT
GLOVE SURG UNDER POLY LF SZ8 (GLOVE) ×2
GOWN STRL REUS W/ TWL LRG LVL3 (GOWN DISPOSABLE) ×2 IMPLANT
GOWN STRL REUS W/ TWL XL LVL3 (GOWN DISPOSABLE) ×1 IMPLANT
GOWN STRL REUS W/TWL LRG LVL3 (GOWN DISPOSABLE) ×4
GOWN STRL REUS W/TWL XL LVL3 (GOWN DISPOSABLE) ×2
HEMOSTAT ARISTA ABSORB 3G PWDR (HEMOSTASIS) ×1 IMPLANT
KIT MARKER MARGIN INK (KITS) ×2 IMPLANT
NDL HYPO 25X1 1.5 SAFETY (NEEDLE) ×1 IMPLANT
NEEDLE HYPO 25X1 1.5 SAFETY (NEEDLE) ×2 IMPLANT
NS IRRIG 1000ML POUR BTL (IV SOLUTION) ×2 IMPLANT
PACK BASIN DAY SURGERY FS (CUSTOM PROCEDURE TRAY) ×2 IMPLANT
PENCIL SMOKE EVACUATOR (MISCELLANEOUS) ×2 IMPLANT
SLEEVE SCD COMPRESS KNEE MED (STOCKING) ×2 IMPLANT
SPONGE T-LAP 4X18 ~~LOC~~+RFID (SPONGE) ×2 IMPLANT
SUT MNCRL AB 4-0 PS2 18 (SUTURE) ×2 IMPLANT
SUT VICRYL 3-0 CR8 SH (SUTURE) ×2 IMPLANT
SYR CONTROL 10ML LL (SYRINGE) ×2 IMPLANT
TOWEL GREEN STERILE FF (TOWEL DISPOSABLE) ×2 IMPLANT
TRACER MAGTRACE VIAL (MISCELLANEOUS) ×1 IMPLANT
TRAY FAXITRON CT DISP (TRAY / TRAY PROCEDURE) ×2 IMPLANT
TUBE CONNECTING 20X1/4 (TUBING) ×2 IMPLANT
YANKAUER SUCT BULB TIP NO VENT (SUCTIONS) ×2 IMPLANT

## 2021-11-16 NOTE — Discharge Instructions (Addendum)
[facility] ?Office Phone Number [phone] ? ?BREAST BIOPSY/ PARTIAL MASTECTOMY: POST OP INSTRUCTIONS ? ?Always review your discharge instruction sheet given to you by the facility where your surgery was performed. ? ?IF YOU HAVE DISABILITY OR FAMILY LEAVE FORMS, YOU MUST BRING THEM TO THE OFFICE FOR PROCESSING.  DO NOT GIVE THEM TO YOUR DOCTOR. ? ?A prescription for pain medication may be given to you upon discharge.  Take your pain medication as prescribed, if needed.  If narcotic pain medicine is not needed, then you may take acetaminophen (Tylenol) or ibuprofen (Advil) as needed. ?Take your usually prescribed medications unless otherwise directed ?If you need a refill on your pain medication, please contact your pharmacy.  They will contact our office to request authorization.  Prescriptions will not be filled after 5pm or on week-ends. ?You should eat very light the first 24 hours after surgery, such as soup, crackers, pudding, etc.  Resume your normal diet the day after surgery. ?Most patients will experience some swelling and bruising in the breast.  Ice packs and a good support bra will help.  Swelling and bruising can take several days to resolve.  ?It is common to experience some constipation if taking pain medication after surgery.  Increasing fluid intake and taking a stool softener will usually help or prevent this problem from occurring.  A mild laxative (Milk of Magnesia or Miralax) should be taken according to package directions if there are no bowel movements after 48 hours. ?Unless discharge instructions indicate otherwise, you may remove your bandages 24-48 hours after surgery, and you may shower at that time.  You may have steri-strips (small skin tapes) in place directly over the incision.  These strips should be left on the skin for 7-10 days.  If your surgeon used skin glue on the incision, you may shower in 24 hours.  The glue will flake off over the next 2-3 weeks.  Any  sutures or staples will be removed at the office during your follow-up visit. ?ACTIVITIES:  You may resume regular daily activities (gradually increasing) beginning the next day.  Wearing a good support bra or sports bra minimizes pain and swelling.  You may have sexual intercourse when it is comfortable. ?You may drive when you no longer are taking prescription pain medication, you can comfortably wear a seatbelt, and you can safely maneuver your car and apply brakes. ?RETURN TO WORK:  ______________________________________________________________________________________ ?You should see your doctor in the office for a follow-up appointment approximately two weeks after your surgery.  Your doctor?s nurse will typically make your follow-up appointment when she calls you with your pathology report.  Expect your pathology report 2-3 business days after your surgery.  You may call to check if you do not hear from [place] after three days. ?OTHER INSTRUCTIONS: _______________________________________________________________________________________________ _____________________________________________________________________________________________________________________________________ ?_____________________________________________________________________________________________________________________________________ ?_____________________________________________________________________________________________________________________________________ ? ?WHEN TO CALL YOUR DOCTOR: ?Fever over 101.0 ?Nausea and/or vomiting. ?Extreme swelling or bruising. ?Continued bleeding from incision. ?Increased pain, redness, or drainage from the incision. ? ?The clinic staff is available to answer your questions during regular business hours.  Please don?t hesitate to call and ask to speak to one of the nurses for clinical concerns.  If you have a medical emergency, go to the nearest emergency room or call 911.  A surgeon from [facility] is always on call at the hospital. ? ?For further questions, please visit [email or web address]   ? ? ?Post Anesthesia Home Care Instructions ? ?Activity: ?Get plenty of rest for the remainder  of the day. A responsible individual must stay with you for 24 hours following the procedure.  ?For the next 24 hours, DO NOT: ?-Drive a car ?-Paediatric nurse ?-Drink alcoholic beverages ?-Take any medication unless instructed by your physician ?-Make any legal decisions or sign important papers. ? ?Meals: ?Start with liquid foods such as gelatin or soup. Progress to regular foods as tolerated. Avoid greasy, spicy, heavy foods. If nausea and/or vomiting occur, drink only clear liquids until the nausea and/or vomiting subsides. Call your physician if vomiting continues. ? ?Special Instructions/Symptoms: ?Your throat may feel dry or sore from the anesthesia or the breathing tube placed in your throat during surgery. If this causes discomfort, gargle with warm salt water. The discomfort should disappear within 24 hours. ? ?If you had a scopolamine patch placed behind your ear for the management of post- operative nausea and/or vomiting: ? ?1. The medication in the patch is effective for 72 hours, after which it should be removed.  Wrap patch in a tissue and discard in the trash. Wash hands thoroughly with soap and water. ?2. You may remove the patch earlier than 72 hours if you experience unpleasant side effects which may include dry mouth, dizziness or visual disturbances. ?3. Avoid touching the patch. Wash your hands with soap and water after contact with the patch. ?   Information for Discharge Teaching: ?EXPAREL (bupivacaine liposome injectable suspension)  ? ?Your surgeon or anesthesiologist gave you EXPAREL(bupivacaine) to help control your pain after surgery.  ?EXPAREL is a local anesthetic that provides pain relief by numbing the tissue around the surgical site. ?EXPAREL is designed to release pain  medication over time and can control pain for up to 72 hours. ?Depending on how you respond to EXPAREL, you may require less pain medication during your recovery. ? ?Possible side effects: ?Temporary loss of sensation or ability to move in the area where bupivacaine was injected. ?Nausea, vomiting, constipation ?Rarely, numbness and tingling in your mouth or lips, lightheadedness, or anxiety may occur. ?Call your doctor right away if you think you may be experiencing any of these sensations, or if you have other questions regarding possible side effects. ? ?Follow all other discharge instructions given to you by your surgeon or nurse. Eat a healthy diet and drink plenty of water or other fluids. ? ?If you return to the hospital for any reason within 96 hours following the administration of EXPAREL, it is important for health care providers to know that you have received this anesthetic. A teal colored band has been placed on your arm with the date, time and amount of EXPAREL you have received in order to alert and inform your health care providers. Please leave this armband in place for the full 96 hours following administration, and then you may remove the band.  ? ?No Tylenol until 1:59 pm ?

## 2021-11-16 NOTE — Anesthesia Procedure Notes (Signed)
Anesthesia Regional Block: Pectoralis block  ? ?Pre-Anesthetic Checklist: , timeout performed,  Correct Patient, Correct Site, Correct Laterality,  Correct Procedure, Correct Position, site marked,  Risks and benefits discussed,  Surgical consent,  Pre-op evaluation,  At surgeon's request and post-op pain management ? ?Laterality: Left ? ?Prep: Maximum Sterile Barrier Precautions used, chloraprep     ?  ?Needles:  ?Injection technique: Single-shot ? ?Needle Type: Echogenic Stimulator Needle   ? ? ?Needle Length: 9cm  ?Needle Gauge: 22  ? ? ? ?Additional Needles: ? ? ?Procedures:,,,, ultrasound used (permanent image in chart),,    ?Narrative:  ?Start time: 11/16/2021 9:10 AM ?End time: 11/16/2021 9:15 AM ?Injection made incrementally with aspirations every 5 mL. ? ?Performed by: Personally  ?Anesthesiologist: Pervis Hocking, DO ? ?Additional Notes: ?Monitors applied. No increased pain on injection. No increased resistance to injection. Injection made in 5cc increments. Good needle visualization. Patient tolerated procedure well.  ? ? ? ? ?

## 2021-11-16 NOTE — Op Note (Signed)
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 ?Postop diagnosis: Same ? ?Procedure: Left breast seed localized lumpectomy with left axillary sentinel lymph node mapping utilizing mag trace ? ?Surgeon: Erroll Luna, MD ? ?Anesthesia: LMA with pectoral block and 0.25% Marcaine with epinephrine ? ?EBL: 30 cc ? ?Specimen: Left breast mass that contained the biopsy clip.  Anterior margin contained the seed which separated from the mass.  This was sent separately as was the seed.  All margins of the cavity were excised except the deep margin which was muscle.  These were oriented with ink and sent to pathology.  There were 3 left axillary sentinel nodes ? ?IV fluids: Per anesthesia record ? ?Indications for procedure: The patient is a 66 year old female with stage II left breast cancer.  She was seen in the multidisciplinary clinic and opted for breast conserving surgery.  Risks and benefits of this were discussed as well as the role of sentinel lymph node mapping and potential lymphedema.  We discussed breast conserving surgery versus mastectomy as well as survival and local regional recurrence.  She opted for left breast seed lumpectomy with left axillary sentinel lymph node mapping.The procedure has been discussed with the patient. Alternatives to surgery have been discussed with the patient.  Risks of surgery include bleeding,  Infection,  Seroma formation, death,  and the need for further surgery.   The patient understands and wishes to proceed.  ? ? ? ?Description of procedure: The patient was met in the holding area and questions were answered.  Left breast was marked as correct site.  Films were available for review.  She underwent left pectoral block per anesthesia.  She was then taken back to the operative room.  She was placed upon upon the OR table.  After induction of general anesthesia, 2 cc of mag trace were injected under the nipple sterile prep and then the left breast was  prepped and draped in sterile fashion and timeout performed.  Proper patient, site and procedure verified.  Of note the breast was massaged for 5 minutes.  Films were available for review.  Neoprobe was used to identify the mass in the left breast which was also palpable.  Timeout was performed to verify the procedure and site, incision was made along the lateral border of the nipple areolar complex.  The mass was identified.  The tissue anterior to it was grasped.  In the process dissection the tissue separated and the seed came out of the tissue which was anterior as indicated on the films.  The seed and this anterior margin were oriented with ink and sent to pathology separately.  The mass was palpable.  We then excised the entire mass.  Then we reexcised all margins around except the deep margin which was muscle.  The Faxitron image revealed the biopsy clip to be in the mass as expected.  The cavity was then irrigated.  Made hemostatic.  Local anesthetic was infiltrated.  The cavities then closed with a deep layer 3-0 Vicryl and skin with 4 Monocryl in a subcuticular stitch. ? ?The mag trace probe was used to identify the hotspot left axilla.  A 4 cm transverse incision was made in the left axilla.  There were 3 hot nodes identified and removed.  Background counts approached baseline.  Irrigation was used.  Hemostasis achieved with cautery and Arista.  The long thoracic nerve, thoracodorsal trunk and extra vein were preserved.  We then closed this layer with 3-0 Vicryl.  4 Monocryl used to close skin.  Dermabond applied each.  All counts were found to be correct.  Breast binder placed.  Patient was then awoke extubated taken to recovery in satisfactory condition. ? ? ?

## 2021-11-16 NOTE — Anesthesia Procedure Notes (Signed)
Procedure Name: LMA Insertion ?Date/Time: 11/16/2021 10:09 AM ?Performed by: Verita Lamb, CRNA ?Pre-anesthesia Checklist: Patient identified, Emergency Drugs available, Suction available and Patient being monitored ?Patient Re-evaluated:Patient Re-evaluated prior to induction ?Oxygen Delivery Method: Circle system utilized ?Preoxygenation: Pre-oxygenation with 100% oxygen ?Induction Type: IV induction ?Ventilation: Mask ventilation without difficulty ?LMA: LMA inserted ?LMA Size: 4.0 ?Number of attempts: 1 ?Airway Equipment and Method: Bite block ?Placement Confirmation: positive ETCO2, breath sounds checked- equal and bilateral and CO2 detector ?Tube secured with: Tape ?Dental Injury: Teeth and Oropharynx as per pre-operative assessment  ? ? ? ? ?
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
## 2021-11-16 NOTE — Anesthesia Postprocedure Evaluation (Signed)
Anesthesia Post Note ? ?Patient: Annette Richard ? ?Procedure(s) Performed: LEFT BREAST LUMPECTOMY WITH RADIOACTIVE SEED AND SENTINEL LYMPH NODE BIOPSY (Left: Breast) ? ?  ? ?Patient location during evaluation: PACU ?Anesthesia Type: General ?Level of consciousness: awake and alert, oriented and patient cooperative ?Pain management: pain level controlled ?Vital Signs Assessment: post-procedure vital signs reviewed and stable ?Respiratory status: spontaneous breathing, nonlabored ventilation and respiratory function stable ?Cardiovascular status: blood pressure returned to baseline and stable ?Postop Assessment: no apparent nausea or vomiting ?Anesthetic complications: no ? ? ?No notable events documented. ? ?Last Vitals:  ?Vitals:  ? 11/16/21 1132 11/16/21 1145  ?BP: 123/73 118/69  ?Pulse: 81 88  ?Resp: 15 16  ?Temp: 37.1 ?C   ?SpO2: 99% 95%  ?  ?Last Pain:  ?Vitals:  ? 11/16/21 1145  ?TempSrc:   ?PainSc: 0-No pain  ? ? ?  ?  ?  ?  ?  ?  ? ?Jarome Matin Billijo Dilling ? ? ? ? ?

## 2021-11-16 NOTE — Interval H&P Note (Signed)
History and Physical Interval Note: ? ?11/16/2021 ?9:39 AM ? ?Annette Richard  has presented today for surgery, with the diagnosis of LEFT BREAST CANCER.  The various methods of treatment have been discussed with the patient and family. After consideration of risks, benefits and other options for treatment, the patient has consented to  Procedure(s): ?LEFT BREAST LUMPECTOMY WITH RADIOACTIVE SEED AND SENTINEL LYMPH NODE BIOPSY (Left) as a surgical intervention.  The patient's history has been reviewed, patient examined, no change in status, stable for surgery.  I have reviewed the patient's chart and labs.  Questions were answered to the patient's satisfaction.   ? ? ?[name] ? ? ?

## 2021-11-16 NOTE — Transfer of Care (Signed)
Immediate Anesthesia Transfer of Care Note ? ?Patient: Annette Richard ? ?Procedure(s) Performed: LEFT BREAST LUMPECTOMY WITH RADIOACTIVE SEED AND SENTINEL LYMPH NODE BIOPSY (Left: Breast) ? ?Patient Location: PACU ? ?Anesthesia Type:General and regional ? ?Level of Consciousness: awake, alert  and oriented ? ?Airway & Oxygen Therapy: Patient Spontanous Breathing and Patient connected to face mask oxygen ? ?Post-op Assessment: Report given to RN and Post -op Vital signs reviewed and stable ? ?Post vital signs: Reviewed and stable ? ?Last Vitals:  ?Vitals Value Taken Time  ?BP 123/73 11/16/21 1131  ?Temp    ?Pulse 87 11/16/21 1133  ?Resp 15 11/16/21 1133  ?SpO2 99 % 11/16/21 1133  ?Vitals shown include unvalidated device data. ? ?Last Pain:  ?Vitals:  ? 11/16/21 0856  ?TempSrc: Oral  ?PainSc: 0-No pain  ?   ? ?  ? ?Complications: No notable events documented. ?

## 2021-11-16 NOTE — Anesthesia Preprocedure Evaluation (Addendum)
Anesthesia Evaluation  ?Patient identified by MRN, date of birth, ID band ?Patient awake ? ? ? ?Reviewed: ?Allergy & Precautions, NPO status , Patient's Chart, lab work & pertinent test results ? ?Airway ?Mallampati: III ? ?TM Distance: >3 FB ?Neck ROM: Full ? ? ? Dental ? ?(+) Edentulous Upper, Missing, Dental Advisory Given,  ?  ?Pulmonary ?Current Smoker and Patient abstained from smoking.,  ?15 pack year history  ?1/4ppd- 1/2ppd ?No inhalers ?  ?Pulmonary exam normal ?breath sounds clear to auscultation ? ? ? ? ? ? Cardiovascular ?hypertension (132/75 in preop), Pt. on medications ?Normal cardiovascular exam ?Rhythm:Regular Rate:Normal ? ? ?  ?Neuro/Psych ?TIAnegative psych ROS  ? GI/Hepatic ?Neg liver ROS, GERD  Controlled,  ?Endo/Other  ?negative endocrine ROS ? Renal/GU ?negative Renal ROS  ?negative genitourinary ?  ?Musculoskeletal ? ?(+) Arthritis , Osteoarthritis,   ? Abdominal ?  ?Peds ? Hematology ?negative hematology ROS ?(+)   ?Anesthesia Other Findings ?L breast ca  ? Reproductive/Obstetrics ?negative OB ROS ? ?  ? ? ? ? ? ? ? ? ? ? ? ? ? ?  ?  ? ? ? ? ? ? ? ?Anesthesia Physical ?Anesthesia Plan ? ?ASA: 2 ? ?Anesthesia Plan: General  ? ?Post-op Pain Management: Regional block*, Tylenol PO (pre-op)* and Toradol IV (intra-op)*  ? ?Induction: Intravenous ? ?PONV Risk Score and Plan: 2 and Ondansetron, Dexamethasone, Midazolam and Treatment may vary due to age or medical condition ? ?Airway Management Planned: LMA ? ?Additional Equipment: None ? ?Intra-op Plan:  ? ?Post-operative Plan: Extubation in OR ? ?Informed Consent: I have reviewed the patients History and Physical, chart, labs and discussed the procedure including the risks, benefits and alternatives for the proposed anesthesia with the patient or authorized representative who has indicated his/her understanding and acceptance.  ? ? ? ?Dental advisory given ? ?Plan Discussed with: CRNA ? ?Anesthesia Plan  Comments:   ? ? ? ? ? ?Anesthesia Quick Evaluation ? ?

## 2021-11-16 NOTE — Progress Notes (Signed)
Assisted Dr. Doroteo Glassman with left, ultrasound guided, pectoralis block. Side rails up, monitors on throughout procedure. See vital signs in flow sheet. Tolerated Procedure well. ?

## 2021-11-20 ENCOUNTER — Encounter (HOSPITAL_BASED_OUTPATIENT_CLINIC_OR_DEPARTMENT_OTHER): Payer: Self-pay | Admitting: Surgery

## 2021-11-21 ENCOUNTER — Encounter: Payer: Self-pay | Admitting: Surgery

## 2021-11-21 LAB — SURGICAL PATHOLOGY

## 2021-11-23 ENCOUNTER — Encounter: Payer: Self-pay | Admitting: *Deleted

## 2021-11-24 ENCOUNTER — Telehealth: Payer: Self-pay | Admitting: Genetic Counselor

## 2021-11-24 NOTE — Telephone Encounter (Signed)
I attempted to contact Ms. Ciolek to discuss her genetic testing results (77 genes). I left a voicemail requesting she call me back at [phone]. ? ?Lucille Passy, MS, LCGC ?Genetic Counselor ?[email or web address] ?(P) [phone] ? ?

## 2021-11-27 ENCOUNTER — Telehealth: Payer: Self-pay | Admitting: Genetic Counselor

## 2021-11-27 ENCOUNTER — Ambulatory Visit: Payer: Self-pay | Admitting: Genetic Counselor

## 2021-11-27 DIAGNOSIS — C50412 Malignant neoplasm of upper-outer quadrant of left female breast: Secondary | ICD-10-CM

## 2021-11-27 DIAGNOSIS — Z1379 Encounter for other screening for genetic and chromosomal anomalies: Secondary | ICD-10-CM

## 2021-11-27 DIAGNOSIS — Z170 Estrogen receptor positive status [ER+]: Secondary | ICD-10-CM

## 2021-11-27 NOTE — Telephone Encounter (Signed)
I contacted Ms. Klassen to discuss her genetic testing results. No pathogenic variants were identified in the 77 genes analyzed. Detailed clinic note to follow. ? ?The test report has been scanned into EPIC and is located under the Molecular Pathology section of the Results Review tab.  A portion of the result report is included below for reference.  ? ?Lucille Passy, MS, [place] ?Genetic Counselor ?[email or web address] ?(P) [phone] ? ? ?

## 2021-11-27 NOTE — Progress Notes (Signed)
HPI:   ?Annette Richard was previously seen in the [facility] due to a personal and family history of cancer and concerns regarding a hereditary predisposition to cancer. Please refer to our prior cancer genetics clinic note for more information regarding our discussion, assessment and recommendations, at the time. Annette Richard recent genetic test results were disclosed to her, as were recommendations warranted by these results. These results and recommendations are discussed in more detail below. ? ?CANCER HISTORY:  ?Oncology History  ?Malignant neoplasm of upper-outer quadrant of left breast in female, estrogen receptor positive ([place])  ?10/16/2021 Initial Diagnosis  ? Malignant neoplasm of upper-outer quadrant of left breast in female, estrogen receptor positive ([place]) ?  ?10/18/2021 Cancer Staging  ? Staging form: Breast, AJCC 8th Edition ?- Clinical: Stage IB (cT2, cN0, cM0, G2, ER+, PR+, HER2-) - Signed by Hayden Pedro, PA-C on 10/18/2021 ?Method of lymph node assessment: Clinical ?Histologic grading system: 3 grade system ? ?  ? Genetic Testing  ? [facility] CancerNext-Expanded Panel was Negative. Report date is 11/13/2021. ? ?The CancerNext-Expanded gene panel offered by [facility] and includes sequencing, rearrangement, and RNA analysis for the following 77 genes: AIP, ALK, APC, ATM, AXIN2, BAP1, BARD1, BLM, BMPR1A, BRCA1, BRCA2, BRIP1, CDC73, CDH1, CDK4, CDKN1B, CDKN2A, CHEK2, CTNNA1, DICER1, FANCC, FH, FLCN, GALNT12, KIF1B, LZTR1, MAX, MEN1, MET, MLH1, MSH2, MSH3, MSH6, MUTYH, NBN, NF1, NF2, NTHL1, PALB2, PHOX2B, PMS2, POT1, PRKAR1A, PTCH1, PTEN, RAD51C, RAD51D, RB1, RECQL, RET, SDHA, SDHAF2, SDHB, SDHC, SDHD, SMAD4, SMARCA4, SMARCB1, SMARCE1, STK11, SUFU, TMEM127, TP53, TSC1, TSC2, VHL and XRCC2 (sequencing and deletion/duplication); EGFR, EGLN1, HOXB13, KIT, MITF, PDGFRA, POLD1, and POLE (sequencing only); EPCAM and GREM1 (deletion/duplication only).  ?  ? ? ?FAMILY HISTORY:   ?We obtained a detailed, 4-generation family history.  Significant diagnoses are listed below: ?     ?Family History  ?Problem Relation Age of Onset  ? Pancreatic cancer Mother 51  ? Mesothelioma Father 24  ? Breast cancer Sister 52  ?      reports negative genetic testing  ? Colon polyps Sister    ?      precancerous  ? Breast cancer Sister    ?      dx. 38s  ? Breast cancer Maternal Grandmother    ? CVA Maternal Grandfather    ?  ?  ?  ?Annette Richard reports a sister diagnosed with breast cancer in her 28s and a second sister diagnosed with breast cancer at age 7. She believes the sister diagnosed with breast cancer at age 27 had negative genetic testing. Her mother was diagnosed with pancreatic cancer at age 62, she died at 77. Her maternal uncle was diagnosed with throat cancer, he is deceased. Her maternal great grandmother was diagnosed with breast cancer, she is deceased. Annette Richard father was diagnosed with mesothelioma at age 53, he died at age 22. There is no reported Ashkenazi Jewish ancestry. ? ?GENETIC TEST RESULTS:  ?The [facility] CancerNext-Expanded Panel found no pathogenic mutations. ? ?The CancerNext-Expanded gene panel offered by [facility] and includes sequencing, rearrangement, and RNA analysis for the following 77 genes: AIP, ALK, APC, ATM, AXIN2, BAP1, BARD1, BLM, BMPR1A, BRCA1, BRCA2, BRIP1, CDC73, CDH1, CDK4, CDKN1B, CDKN2A, CHEK2, CTNNA1, DICER1, FANCC, FH, FLCN, GALNT12, KIF1B, LZTR1, MAX, MEN1, MET, MLH1, MSH2, MSH3, MSH6, MUTYH, NBN, NF1, NF2, NTHL1, PALB2, PHOX2B, PMS2, POT1, PRKAR1A, PTCH1, PTEN, RAD51C, RAD51D, RB1, RECQL, RET, SDHA, SDHAF2, SDHB, SDHC, SDHD, SMAD4, SMARCA4, SMARCB1, SMARCE1, STK11, SUFU, TMEM127,  TP53, TSC1, TSC2, VHL and XRCC2 (sequencing and deletion/duplication); EGFR, EGLN1, HOXB13, KIT, MITF, PDGFRA, POLD1, and POLE (sequencing only); EPCAM and GREM1 (deletion/duplication only).  ? ?The test report has been scanned into EPIC and is located under the  Molecular Pathology section of the Results Review tab.  A portion of the result report is included below for reference. Genetic testing reported out on 11/13/2021.  ? ? ? ? ? ?Even though a pathogenic variant was not identified, possible explanations for the cancer in the family may include: ?There may be no hereditary risk for cancer in the family. The cancers in Annette Richard and/or her family may be due to other genetic or environmental factors. ?There may be a gene mutation in one of these genes that current testing methods cannot detect, but that chance is small. ?There could be another gene that has not yet been discovered, or that we have not yet tested, that is responsible for the cancer diagnoses in the family.  ?It is also possible there is a hereditary cause for the cancer in the family that Annette Richard did not inherit. ? ?Therefore, it is important to remain in touch with cancer genetics in the future so that we can continue to offer Annette Richard the most up to date genetic testing.  ? ?ADDITIONAL GENETIC TESTING:  ?We discussed with Annette Richard that her genetic testing was fairly extensive.  If there are genes identified to increase cancer risk that can be analyzed in the future, we would be happy to discuss and coordinate this testing at that time.   ? ?CANCER SCREENING RECOMMENDATIONS:  ?Annette Richard test result is considered negative (normal).  This means that we have not identified a hereditary cause for her personal and family history of cancer at this time.  ? ?An individual's cancer risk and medical management are not determined by genetic test results alone. Overall cancer risk assessment incorporates additional factors, including personal medical history, family history, and any available genetic information that may result in a personalized plan for cancer prevention and surveillance. Therefore, it is recommended she continue to follow the cancer management and screening guidelines  provided by her oncology and primary healthcare provider. ? ?RECOMMENDATIONS FOR FAMILY MEMBERS:   ?Since she did not inherit a mutation in a cancer predisposition gene included on this panel, her children could not have inherited a mutation from her in one of these genes. ?Individuals in this family might be at some increased risk of developing cancer, over the general population risk, due to the family history of cancer. We recommend women in this family have a yearly mammogram beginning at age 58, or 65 years younger than the earliest onset of cancer, an annual clinical breast exam, and perform monthly breast self-exams.  ?Other members of the family may still carry a pathogenic variant in one of these genes that Annette Richard did not inherit. Based on the family history, we recommend her sisters with a history of breast cancer have genetic counseling and testing. ? ?FOLLOW-UP:  ?Cancer genetics is a rapidly advancing field and it is possible that new genetic tests will be appropriate for her and/or her family members in the future. We encouraged her to remain in contact with cancer genetics on an annual basis so we can update her personal and family histories and let her know of advances in cancer genetics that may benefit this family.  ? ?Our contact number was provided. Annette Richard questions were answered to her satisfaction, and  she knows she is welcome to call us at anytime with additional questions or concerns.  ? ?Lucille Passy, MS, [place] ?Genetic Counselor ?[email or web address] ?(P) [phone] ? ? ?

## 2021-11-28 ENCOUNTER — Telehealth: Payer: Self-pay | Admitting: *Deleted

## 2021-11-28 NOTE — Telephone Encounter (Signed)
Requested additional tissue be sent for oncotype due to insufficient tissue on 1st sample sent.  ?

## 2021-11-30 ENCOUNTER — Encounter (HOSPITAL_COMMUNITY): Payer: Self-pay

## 2021-12-05 DIAGNOSIS — C50412 Malignant neoplasm of upper-outer quadrant of left female breast: Secondary | ICD-10-CM | POA: Diagnosis not present

## 2021-12-05 DIAGNOSIS — Z170 Estrogen receptor positive status [ER+]: Secondary | ICD-10-CM | POA: Diagnosis not present

## 2021-12-07 ENCOUNTER — Encounter: Payer: Self-pay | Admitting: *Deleted

## 2021-12-07 ENCOUNTER — Telehealth: Payer: Self-pay | Admitting: *Deleted

## 2021-12-07 ENCOUNTER — Telehealth: Payer: Self-pay | Admitting: Radiation Oncology

## 2021-12-07 DIAGNOSIS — C50412 Malignant neoplasm of upper-outer quadrant of left female breast: Secondary | ICD-10-CM

## 2021-12-07 NOTE — Telephone Encounter (Signed)
3/23 @ 1:29 pm Left voicemail for patient to call our office.  ?

## 2021-12-07 NOTE — Telephone Encounter (Signed)
Received Oncotype score of 4. Physician team notified. Called pt, left vm with results and chemo not recommended. Next step is xrt with Dr. Lisbeth Renshaw. Request return call with questions or needs.  ?Referral placed ?

## 2021-12-07 NOTE — Telephone Encounter (Signed)
Spoke to pt regarding Oncotype score of 4. Informed chemo not recommended. Received verbal understanding. Confirmed appt with Dr. Lisbeth Renshaw on 3/29 ?

## 2021-12-08 ENCOUNTER — Inpatient Hospital Stay: Payer: 59 | Attending: Hematology and Oncology | Admitting: Hematology and Oncology

## 2021-12-08 ENCOUNTER — Encounter (HOSPITAL_COMMUNITY): Payer: Self-pay

## 2021-12-08 ENCOUNTER — Other Ambulatory Visit: Payer: Self-pay

## 2021-12-08 ENCOUNTER — Other Ambulatory Visit: Payer: Self-pay | Admitting: Radiation Oncology

## 2021-12-08 ENCOUNTER — Encounter: Payer: Self-pay | Admitting: Hematology and Oncology

## 2021-12-08 ENCOUNTER — Inpatient Hospital Stay
Admission: RE | Admit: 2021-12-08 | Discharge: 2021-12-08 | Disposition: A | Discharge status: Home / Self Care | Payer: Self-pay | Source: Ambulatory Visit | Attending: Radiation Oncology | Admitting: Radiation Oncology

## 2021-12-08 ENCOUNTER — Ambulatory Visit
Admission: RE | Admit: 2021-12-08 | Discharge: 2021-12-08 | Disposition: A | Discharge status: Home / Self Care | Payer: Self-pay | Source: Ambulatory Visit | Attending: Radiation Oncology | Admitting: Radiation Oncology

## 2021-12-08 DIAGNOSIS — R69 Illness, unspecified: Secondary | ICD-10-CM | POA: Diagnosis not present

## 2021-12-08 DIAGNOSIS — F17210 Nicotine dependence, cigarettes, uncomplicated: Secondary | ICD-10-CM | POA: Diagnosis not present

## 2021-12-08 DIAGNOSIS — C50412 Malignant neoplasm of upper-outer quadrant of left female breast: Secondary | ICD-10-CM | POA: Diagnosis not present

## 2021-12-08 DIAGNOSIS — Z170 Estrogen receptor positive status [ER+]: Secondary | ICD-10-CM | POA: Insufficient documentation

## 2021-12-08 NOTE — Assessment & Plan Note (Signed)
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  Given ER/PR strongly positive and relatively small size tumor with no associated lymph nodes, we agreed to proceed with lumpectomy first followed by adjuvant radiation and Oncotype testing. ?Oncotype score of 4, no benefit of chemotherapy, distant risk of recurrence at 9 years with AI or Tam alone is 3%.  We have discussed Oncotype results today.  She is already scheduled to see radiation oncology next week.  We have once again discussed about antiestrogen therapy.  She prefers to try the aromatase inhibitors.  Baseline bone density screening ordered.  She will return to clinic in mid May when we will start her on antiestrogen therapy.  ? ?We have previously discussed mechanism of action and adverse effects of antiestrogen therapy. ?Thank you for consulting [place] in the care of this patient.  Please do not hesitate to contact us with any additional questions or concerns. ?

## 2021-12-08 NOTE — Progress Notes (Signed)
[facility] ?CONSULT NOTE ? ?Patient Care Team: ?Ladell Pier, MD as PCP - General (Internal Medicine) ?Patient, No Pcp Per (Inactive) (General Practice) ?Erroll Luna, MD as Consulting Physician (General Surgery) ?Benay Pike, MD as Consulting Physician (Hematology and Oncology) ?Kyung Rudd, MD as Consulting Physician (Radiation Oncology) ?Mauro Kaufmann, RN as Oncology Nurse Navigator ?Rockwell Germany, RN as Oncology Nurse Navigator ? ?CHIEF COMPLAINTS/PURPOSE OF CONSULTATION:  ?Newly diagnosed breast cancer ? ?HISTORY OF PRESENTING ILLNESS:  ?Annette Richard 66 y.o. female is here because of recent diagnosis of left sided breast cancer. ? ? ?SUMMARY OF ONCOLOGIC HISTORY: ?Oncology History  ?Malignant neoplasm of upper-outer quadrant of left breast in female, estrogen receptor positive ([place])  ?10/16/2021 Initial Diagnosis  ? Malignant neoplasm of upper-outer quadrant of left breast in female, estrogen receptor positive ([place]) ?  ?10/18/2021 Cancer Staging  ? Staging form: Breast, AJCC 8th Edition ?- Clinical: Stage IB (cT2, cN0, cM0, G2, ER+, PR+, HER2-) - Signed by Hayden Pedro, PA-C on 10/18/2021 ?Method of lymph node assessment: Clinical ?Histologic grading system: 3 grade system ? ?  ? Genetic Testing  ? [facility] CancerNext-Expanded Panel was Negative. Report date is 11/13/2021. ? ?The CancerNext-Expanded gene panel offered by [facility] and includes sequencing, rearrangement, and RNA analysis for the following 77 genes: AIP, ALK, APC, ATM, AXIN2, BAP1, BARD1, BLM, BMPR1A, BRCA1, BRCA2, BRIP1, CDC73, CDH1, CDK4, CDKN1B, CDKN2A, CHEK2, CTNNA1, DICER1, FANCC, FH, FLCN, GALNT12, KIF1B, LZTR1, MAX, MEN1, MET, MLH1, MSH2, MSH3, MSH6, MUTYH, NBN, NF1, NF2, NTHL1, PALB2, PHOX2B, PMS2, POT1, PRKAR1A, PTCH1, PTEN, RAD51C, RAD51D, RB1, RECQL, RET, SDHA, SDHAF2, SDHB, SDHC, SDHD, SMAD4, SMARCA4, SMARCB1, SMARCE1, STK11, SUFU, TMEM127, TP53, TSC1, TSC2, VHL and XRCC2 (sequencing and  deletion/duplication); EGFR, EGLN1, HOXB13, KIT, MITF, PDGFRA, POLD1, and POLE (sequencing only); EPCAM and GREM1 (deletion/duplication only).  ?  ?11/16/2021 Surgery  ? She underwent left breast seed localized lumpectomy with left axillary sentinel lymph node mapping. ?Surgical pathology showed invasive ductal carcinoma, grade 2, 3.6 cm in maximal extent, tumor approaches to less than 1 mm from closest resection margin, DCIS, intermediate grade, no metastatic carcinoma in the any of the lymph nodes.  Prior prognostic showed ER 100% positive, strong staining, PR 100% positive, strong staining, HER2 negative and Ki-67 of 5% ?  ?12/07/2021 Oncotype testing  ? Oncotype of 4, group salute chemotherapy benefit less than 1%.  Distant recurrence risk at 9 years with AI or Tam is 3%. ?  ? ?MEDICAL HISTORY:  ?Past Medical History:  ?Diagnosis Date  ? Arthritis   ? Breast cancer ([place])   ? ? ?SURGICAL HISTORY: ?Past Surgical History:  ?Procedure Laterality Date  ? ABDOMINAL HYSTERECTOMY    ? APPENDECTOMY    ? appynedectomy    ? BREAST LUMPECTOMY WITH RADIOACTIVE SEED AND SENTINEL LYMPH NODE BIOPSY Left 11/16/2021  ? Procedure: LEFT BREAST LUMPECTOMY WITH RADIOACTIVE SEED AND SENTINEL LYMPH NODE BIOPSY;  Surgeon: Erroll Luna, MD;  Location: [place] [facility];  Service: General;  Laterality: Left;  ? CHOLECYSTECTOMY    ? FACIAL LACERATION REPAIR N/A 07/10/2021  ? Procedure: FACIAL LACERATION REPAIR;  Surgeon: Jesusita Oka, MD;  Location: [facility];  Service: General;  Laterality: N/A;  ? HIP CLOSED REDUCTION Left 07/10/2021  ? Procedure: ATTEMPTED CLOSED REDUCTION HIP, OPEN REDUCTION LEFT HIP;  Surgeon: Shona Needles, MD;  Location: [facility];  Service: Orthopedics;  Laterality: Left;  ? KNEE ARTHROSCOPY WITH MENISCAL REPAIR Right   ? KNEE SURGERY    ? ? ?  SOCIAL HISTORY: ?Social History  ? ?Socioeconomic History  ? Marital status: Widowed  ?  Spouse name: Not on file  ? Number of children: 2  ? Years of education: Not on  file  ? Highest education level: Not on file  ?Occupational History  ? Occupation: Retired Corporate treasurer  ?Tobacco Use  ? Smoking status: Every Day  ?  Packs/day: 1.00  ?  Years: 15.00  ?  Pack years: 15.00  ?  Types: Cigarettes  ? Smokeless tobacco: Never  ?Vaping Use  ? Vaping Use: Never used  ?Substance and Sexual Activity  ? Alcohol use: Not Currently  ? Drug use: Never  ? Sexual activity: Never  ?  Birth control/protection: Abstinence  ?Other Topics Concern  ? Not on file  ?Social History Narrative  ? ** Merged History Encounter **  ?    ? ?Social Determinants of Health  ? ?Financial Resource Strain: High Risk  ? Difficulty of Paying Living Expenses: Hard  ?Food Insecurity: Food Insecurity Present  ? Worried About Charity fundraiser in the Last Year: Often true  ? Ran Out of Food in the Last Year: Sometimes true  ?Transportation Needs: Unmet Transportation Needs  ? Lack of Transportation (Medical): No  ? Lack of Transportation (Non-Medical): Yes  ?Physical Activity: Not on file  ?Stress: Not on file  ?Social Connections: Not on file  ?Intimate Partner Violence: Not on file  ? ? ?FAMILY HISTORY: ?Family History  ?Problem Relation Age of Onset  ? Pancreatic cancer Mother 87  ? Mesothelioma Father 15  ? Breast cancer Sister 2  ?     reports negative genetic testing  ? Colon polyps Sister   ?     precancerous  ? Breast cancer Sister   ?     dx. 51s  ? Pulmonary fibrosis Brother   ? CVA Maternal Grandfather   ? Breast cancer Maternal Great-grandmother   ? ? ?ALLERGIES:  has No Known Allergies. ? ?MEDICATIONS:  ?Current Outpatient Medications  ?Medication Sig Dispense Refill  ? acetaminophen (TYLENOL) 500 MG tablet Take 1,000 mg by mouth every 6 (six) hours as needed for mild pain.    ? amLODipine (NORVASC) 5 MG tablet Take 1 tablet (5 mg total) by mouth daily. 30 tablet 5  ? aspirin 81 MG EC tablet Take 1 tablet (81 mg total) by mouth daily. Swallow whole. 30 tablet 11  ? atorvastatin (LIPITOR) 10 MG tablet Take 1 tablet  (10 mg total) by mouth daily. 30 tablet 3  ? cholecalciferol (VITAMIN D3) 25 MCG (1000 UNIT) tablet Take 2,000 Units by mouth daily.    ? docusate sodium (COLACE) 100 MG capsule Take 1 capsule (100 mg total) by mouth 2 (two) times daily as needed for mild constipation. 10 capsule 0  ? ibuprofen (ADVIL) 800 MG tablet Take 1 tablet (800 mg total) by mouth every 8 (eight) hours as needed. 30 tablet 0  ? Multiple Vitamins-Iron (MULTIVITAMINS WITH IRON) TABS tablet Take 1 tablet by mouth daily.  0  ? oxyCODONE (OXY IR/ROXICODONE) 5 MG immediate release tablet Take 1 tablet (5 mg total) by mouth every 6 (six) hours as needed for severe pain. 15 tablet 0  ? ?Current Facility-Administered Medications  ?Medication Dose Route Frequency Provider Last Rate Last Admin  ? triamcinolone acetonide (KENALOG) 10 MG/ML injection 10 mg  10 mg Other Once Trula Slade, DPM      ? ? ?PHYSICAL EXAMINATION: ?ECOG PERFORMANCE STATUS: 0 - Asymptomatic ? ?  Vitals:  ? 12/08/21 0901  ?BP: 128/87  ?Pulse: 73  ?Resp: 18  ?Temp: 97.7 ?F (36.5 ?C)  ?SpO2: 100%  ? ? ?Filed Weights  ? 12/08/21 0901  ?Weight: 165 lb 3.2 oz (74.9 kg)  ? ? ? ?GENERAL:alert, no distress and comfortable ?Left breast lumpectomy healing well, there is a small area of dehiscence in the left axillary incision, no evidence of infection ? ?LABORATORY DATA:  ?I have reviewed the data as listed ?Lab Results  ?Component Value Date  ? WBC 8.5 10/18/2021  ? HGB 13.3 10/18/2021  ? HCT 40.4 10/18/2021  ? MCV 86.5 10/18/2021  ? PLT 325 10/18/2021  ? ?Lab Results  ?Component Value Date  ? NA 139 10/18/2021  ? K 3.7 10/18/2021  ? CL 104 10/18/2021  ? CO2 27 10/18/2021  ? ? ?RADIOGRAPHIC STUDIES: ?I have personally reviewed the radiological reports and agreed with the findings in the report. ? ?ASSESSMENT AND PLAN:  ? 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  Given ER/PR strongly po
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
## 2021-12-12 ENCOUNTER — Telehealth: Payer: Self-pay

## 2021-12-12 NOTE — Telephone Encounter (Addendum)
Left voicemail reminder of patients 9:00am-12/13/21 in-person consult appointment w/ Shona Simpson PA-C. I advised patient to arrive 63mn early for check-in. I left my extension [phone] case patient needs anything. ?

## 2021-12-13 ENCOUNTER — Other Ambulatory Visit: Payer: Self-pay

## 2021-12-13 ENCOUNTER — Ambulatory Visit
Admission: RE | Admit: 2021-12-13 | Discharge: 2021-12-13 | Disposition: A | Discharge status: Home / Self Care | Payer: 59 | Source: Ambulatory Visit | Attending: Radiation Oncology | Admitting: Radiation Oncology

## 2021-12-13 ENCOUNTER — Ambulatory Visit: Payer: 59 | Admitting: Radiation Oncology

## 2021-12-13 ENCOUNTER — Encounter: Payer: Self-pay | Admitting: Radiation Oncology

## 2021-12-13 VITALS — BP 108/74 | HR 90 | Temp 97.7°F | Resp 20 | Ht 66.5 in

## 2021-12-13 DIAGNOSIS — Z170 Estrogen receptor positive status [ER+]: Secondary | ICD-10-CM | POA: Insufficient documentation

## 2021-12-13 DIAGNOSIS — Z7982 Long term (current) use of aspirin: Secondary | ICD-10-CM | POA: Diagnosis not present

## 2021-12-13 DIAGNOSIS — Z800 Family history of malignant neoplasm of digestive organs: Secondary | ICD-10-CM | POA: Insufficient documentation

## 2021-12-13 DIAGNOSIS — C50412 Malignant neoplasm of upper-outer quadrant of left female breast: Secondary | ICD-10-CM | POA: Insufficient documentation

## 2021-12-13 DIAGNOSIS — Z803 Family history of malignant neoplasm of breast: Secondary | ICD-10-CM | POA: Insufficient documentation

## 2021-12-13 DIAGNOSIS — R69 Illness, unspecified: Secondary | ICD-10-CM | POA: Diagnosis not present

## 2021-12-13 DIAGNOSIS — Z79899 Other long term (current) drug therapy: Secondary | ICD-10-CM | POA: Diagnosis not present

## 2021-12-13 DIAGNOSIS — Z853 Personal history of malignant neoplasm of breast: Secondary | ICD-10-CM | POA: Diagnosis not present

## 2021-12-13 DIAGNOSIS — F17210 Nicotine dependence, cigarettes, uncomplicated: Secondary | ICD-10-CM | POA: Insufficient documentation

## 2021-12-13 NOTE — Progress Notes (Addendum)
Consult appointment. I verified patient identity and began nursing interview. Patient reports LT axilla tenderness 1/10, post draining of seroma. Patient is on the antibiotic Doxycycline. Site is swollen w/ redness and slow healing. No other issues reported at this time. ? ?Meaningful use complete. ?Hysterectomy- NO chances of pregnancy. ? ?BP 108/74 (BP Location: Right Arm, Patient Position: Sitting, Cuff Size: Normal)   Pulse 90   Temp 97.7 ?F (36.5 ?C) (Temporal)   Resp 20   Ht 5' 6.5" (1.689 m)   Wt (P) 165 lb 12.8 oz (75.2 kg)   SpO2 100%   BMI (P) 26.36 kg/m?  ? ?

## 2021-12-14 ENCOUNTER — Ambulatory Visit: Payer: 59 | Attending: Surgery | Admitting: Physical Therapy

## 2021-12-14 ENCOUNTER — Encounter: Payer: Self-pay | Admitting: Physical Therapy

## 2021-12-14 ENCOUNTER — Encounter: Payer: Self-pay | Admitting: *Deleted

## 2021-12-14 DIAGNOSIS — Z483 Aftercare following surgery for neoplasm: Secondary | ICD-10-CM | POA: Insufficient documentation

## 2021-12-14 DIAGNOSIS — R262 Difficulty in walking, not elsewhere classified: Secondary | ICD-10-CM | POA: Insufficient documentation

## 2021-12-14 DIAGNOSIS — Z170 Estrogen receptor positive status [ER+]: Secondary | ICD-10-CM | POA: Insufficient documentation

## 2021-12-14 DIAGNOSIS — R293 Abnormal posture: Secondary | ICD-10-CM | POA: Diagnosis not present

## 2021-12-14 DIAGNOSIS — C50412 Malignant neoplasm of upper-outer quadrant of left female breast: Secondary | ICD-10-CM | POA: Diagnosis not present
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
## 2021-12-14 NOTE — Patient Instructions (Signed)
[facility] ? [address], [address] ? [place] [address] ? [phone] ? ?After Breast Cancer Class ?It is recommended you attend the ABC class to be educated on lymphedema risk reduction. This class is free of charge and lasts for 1 hour. It is a 1-time class. You will need to download the Webex app either on your phone or computer. We will send you a link the night before or the morning of the class. You should be able to click on that link to join the class. This is not a confidential class. You don't have to turn your camera on, but other participants may be able to see your email address. You are scheduled for April 3rd, 2023 at 11:00. ? ?Scar massage ?You can begin gentle scar massage to you incision sites. Gently place one hand on the incision and move the skin (without sliding on the skin) in various directions. Do this for a few minutes and then you can gently massage either coconut oil or vitamin E cream into the scars. Once the glue comes off and the incisions are completely closed, you can begin this. ? ?Compression garment ?You should continue wearing your compression bra until you feel like you no longer have swelling. ? ?Home exercise Program ?Continue doing the exercises you were given until you feel like you can do them without feeling any tightness at the end.  ? ?Walking Program ?Studies show that 30 minutes of walking per day (fast enough to elevate your heart rate) can significantly reduce the risk of a cancer recurrence. If you can't walk due to other medical reasons, we encourage you to find another activity you could do (like a stationary bike or water exercise). ? ?Posture ?After breast cancer surgery, people frequently sit with rounded shoulders posture because it puts their incisions on slack and feels better. If you sit like this and scar tissue forms in that position, you can become very tight and have pain sitting or standing with good posture. Try to be aware  of your posture and sit and stand up tall to heal properly. ? ?Follow up PT: ?It is recommended you return every 3 months for the first 3 years following surgery to be assessed on the SOZO machine for an L-Dex score. This helps prevent clinically significant lymphedema in 95% of patients. These follow up screens are 10 minute appointments that you are not billed for. You are scheduled for February 22, 2022 at 10:50 am. ? ?

## 2021-12-16 DIAGNOSIS — T8140XA Infection following a procedure, unspecified, initial encounter: Secondary | ICD-10-CM | POA: Diagnosis not present

## 2021-12-20 ENCOUNTER — Encounter: Payer: Self-pay | Admitting: Radiation Oncology

## 2021-12-21 ENCOUNTER — Telehealth: Payer: Self-pay | Admitting: Hematology and Oncology

## 2021-12-21 NOTE — Telephone Encounter (Signed)
Rescheduled appointment per providers template. Left message.  ? ?

## 2021-12-22 ENCOUNTER — Ambulatory Visit: Payer: 59 | Admitting: Radiation Oncology

## 2021-12-27 ENCOUNTER — Encounter: Payer: Self-pay | Admitting: *Deleted

## 2021-12-29 ENCOUNTER — Encounter: Payer: Self-pay | Admitting: Genetic Counselor

## 2022-01-02 ENCOUNTER — Encounter: Payer: Self-pay | Admitting: Licensed Clinical Social Worker

## 2022-01-02 ENCOUNTER — Ambulatory Visit
Admission: RE | Admit: 2022-01-02 | Discharge: 2022-01-02 | Disposition: A | Discharge status: Home / Self Care | Payer: 59 | Source: Ambulatory Visit | Attending: Radiation Oncology | Admitting: Radiation Oncology

## 2022-01-02 ENCOUNTER — Other Ambulatory Visit: Payer: Self-pay

## 2022-01-02 DIAGNOSIS — C50412 Malignant neoplasm of upper-outer quadrant of left female breast: Secondary | ICD-10-CM | POA: Insufficient documentation

## 2022-01-02 DIAGNOSIS — Z170 Estrogen receptor positive status [ER+]: Secondary | ICD-10-CM | POA: Diagnosis not present

## 2022-01-02 NOTE — Progress Notes (Signed)
[facility] CSW Progress Note ? ?Clinical Social Worker received TC from patient. Pt is starting radiation treatment next week and her family won't be able to drive her to every appt. Pt does not have insurance benefit for transportation. CSW gave information for [facility] and referred to transportation coordinator.  ? ?Pt also requested help submitting bill to [facility] in [place] for payment. Pt will bring with her to next appt and CSW will help fax to [facility]. ? ? ? ?Christeen Douglas , LCSW ?

## 2022-01-04 ENCOUNTER — Encounter: Payer: Self-pay | Admitting: *Deleted

## 2022-01-09 ENCOUNTER — Ambulatory Visit
Admission: RE | Admit: 2022-01-09 | Discharge: 2022-01-09 | Disposition: A | Discharge status: Home / Self Care | Payer: 59 | Source: Ambulatory Visit | Attending: Radiation Oncology | Admitting: Radiation Oncology

## 2022-01-09 ENCOUNTER — Other Ambulatory Visit: Payer: Self-pay

## 2022-01-09 DIAGNOSIS — Z170 Estrogen receptor positive status [ER+]: Secondary | ICD-10-CM | POA: Diagnosis not present

## 2022-01-09 DIAGNOSIS — C50412 Malignant neoplasm of upper-outer quadrant of left female breast: Secondary | ICD-10-CM | POA: Diagnosis not present

## 2022-01-09 DIAGNOSIS — Z510 Encounter for antineoplastic radiation therapy: Secondary | ICD-10-CM | POA: Diagnosis not present

## 2022-01-09 LAB — RAD ONC ARIA SESSION SUMMARY
Course Elapsed Days: 0
Plan Fractions Treated to Date: 1
Plan Prescribed Dose Per Fraction: 2.66 Gy
Plan Total Fractions Prescribed: 16
Plan Total Prescribed Dose: 42.56 Gy
Reference Point Dosage Given to Date: 2.66 Gy
Reference Point Session Dosage Given: 2.66 Gy
Session Number: 1

## 2022-01-10 ENCOUNTER — Ambulatory Visit
Admission: RE | Admit: 2022-01-10 | Discharge: 2022-01-10 | Disposition: A | Discharge status: Home / Self Care | Payer: 59 | Source: Ambulatory Visit | Attending: Radiation Oncology | Admitting: Radiation Oncology

## 2022-01-10 ENCOUNTER — Other Ambulatory Visit: Payer: Self-pay

## 2022-01-10 DIAGNOSIS — C50412 Malignant neoplasm of upper-outer quadrant of left female breast: Secondary | ICD-10-CM | POA: Diagnosis not present

## 2022-01-10 DIAGNOSIS — Z170 Estrogen receptor positive status [ER+]: Secondary | ICD-10-CM | POA: Diagnosis not present

## 2022-01-10 DIAGNOSIS — Z510 Encounter for antineoplastic radiation therapy: Secondary | ICD-10-CM | POA: Diagnosis not present

## 2022-01-10 LAB — RAD ONC ARIA SESSION SUMMARY
Course Elapsed Days: 1
Plan Fractions Treated to Date: 2
Plan Prescribed Dose Per Fraction: 2.66 Gy
Plan Total Fractions Prescribed: 16
Plan Total Prescribed Dose: 42.56 Gy
Reference Point Dosage Given to Date: 5.32 Gy
Reference Point Session Dosage Given: 2.66 Gy
Session Number: 2

## 2022-01-10 NOTE — Progress Notes (Signed)
Pt here for patient teaching.  Pt given Radiation and You booklet, skin care instructions, alra deodorant and Radiaplex.    Reviewed areas of pertinence such as fatigue, hair loss, skin changes, breast tenderness, and breast swelling. Pt able to give teach back of to pat skin and use unscented/gentle soap,apply Radiaplex bid, avoid applying anything to skin within 4 hours of treatment, avoid wearing an under wire bra, and to use an electric razor if they must shave. Pt verbalizes understanding of information given and will contact nursing with any questions or concerns.    Darrell Leonhardt M. Annina Piotrowski RN, BSN  

## 2022-01-11 ENCOUNTER — Encounter: Payer: Self-pay | Admitting: Internal Medicine

## 2022-01-11 ENCOUNTER — Ambulatory Visit
Admission: RE | Admit: 2022-01-11 | Discharge: 2022-01-11 | Disposition: A | Discharge status: Home / Self Care | Payer: 59 | Source: Ambulatory Visit | Attending: Radiation Oncology | Admitting: Radiation Oncology

## 2022-01-11 ENCOUNTER — Other Ambulatory Visit: Payer: Self-pay

## 2022-01-11 ENCOUNTER — Ambulatory Visit: Payer: 59 | Attending: Internal Medicine | Admitting: Internal Medicine

## 2022-01-11 VITALS — BP 130/80 | HR 84 | Wt 164.8 lb

## 2022-01-11 DIAGNOSIS — Z23 Encounter for immunization: Secondary | ICD-10-CM

## 2022-01-11 DIAGNOSIS — E559 Vitamin D deficiency, unspecified: Secondary | ICD-10-CM

## 2022-01-11 DIAGNOSIS — E663 Overweight: Secondary | ICD-10-CM

## 2022-01-11 DIAGNOSIS — E782 Mixed hyperlipidemia: Secondary | ICD-10-CM | POA: Diagnosis not present

## 2022-01-11 DIAGNOSIS — F17200 Nicotine dependence, unspecified, uncomplicated: Secondary | ICD-10-CM | POA: Diagnosis not present

## 2022-01-11 DIAGNOSIS — I10 Essential (primary) hypertension: Secondary | ICD-10-CM | POA: Diagnosis not present

## 2022-01-11 DIAGNOSIS — R69 Illness, unspecified: Secondary | ICD-10-CM | POA: Diagnosis not present

## 2022-01-11 DIAGNOSIS — Z170 Estrogen receptor positive status [ER+]: Secondary | ICD-10-CM | POA: Diagnosis not present

## 2022-01-11 DIAGNOSIS — Z510 Encounter for antineoplastic radiation therapy: Secondary | ICD-10-CM | POA: Diagnosis not present

## 2022-01-11 DIAGNOSIS — I700 Atherosclerosis of aorta: Secondary | ICD-10-CM | POA: Diagnosis not present

## 2022-01-11 DIAGNOSIS — C50412 Malignant neoplasm of upper-outer quadrant of left female breast: Secondary | ICD-10-CM | POA: Diagnosis not present

## 2022-01-11 LAB — RAD ONC ARIA SESSION SUMMARY
Course Elapsed Days: 2
Plan Fractions Treated to Date: 3
Plan Prescribed Dose Per Fraction: 2.66 Gy
Plan Total Fractions Prescribed: 16
Plan Total Prescribed Dose: 42.56 Gy
Reference Point Dosage Given to Date: 7.98 Gy
Reference Point Session Dosage Given: 2.66 Gy
Session Number: 3

## 2022-01-11 MED ORDER — NICOTINE 21 MG/24HR TD PT24: 21.0000 mg | MEDICATED_PATCH | Freq: Every day | TRANSDERMAL | 1 refills | Status: DC

## 2022-01-11 NOTE — Patient Instructions (Signed)
You can call 1800-Quit Now to request the nicotine patches for free.  ?

## 2022-01-11 NOTE — Progress Notes (Signed)
? ? ?Patient ID: Annette Richard, female    DOB: Dec 15, 1955  MRN: [id number] ? ?CC: Chronic disease management ? ?Subjective: ?Annette Richard is a 66 y.o. female who presents for chronic disease management ?Her concerns today include:  ?Patient with history of HTN, tobacco dependence, anemia secondary to trauma, Vit D def.  LT breast CA ERP/PRP/Her2 - (lumpectomy 11/2021.  Plan for XRT and antiestrogen therapy), liver laceration/scalp laceration/left hip anterior dislocation with femoral neck and intertrochanteric fracture fall as a result of trauma 06/20/2021 ? ?Breast CA: coping okay mentally but today was a bad day. Her transportation did not show up to take her to XRT therapy session this a.m.  Her son-in-law had to take her. ?-pt reports she tested negative for BRCA gene and so did her sister ?-she gets out and walk a block every day.  Off the cane now.  Plans to try walking more. ?Eating more but smaller portions.  Lots of veggies and fruits.  Drinking more water. ?Wgh has stayed stable, BMI 26 ? ?HYPERTENSION ?Currently taking: see medication list.  Norvasc 5 mg daily ?Med Adherence: '[x]'  Yes    '[]'  No ?Medication side effects: '[]'  Yes    '[x]'  No ?Adherence with salt restriction: '[x]'  Yes    '[]'  No ?Home Monitoring?: '[x]'  Yes has log    '[]'  No ?Monitoring Frequency: several times a mth ?Home BP results range:  ?SOB? '[]'  Yes    '[x]'  No ?Chest Pain?: '[]'  Yes    '[x]'  No ?Leg swelling?: '[]'  Yes    '[x]'  No ?Headaches?: '[]'  Yes    '[x]'  No ?Dizziness? '[]'  Yes    '[x]'  No ?Comments:  ? ?HL/aortic atherosclerosis:  taking and tolerating Lipitor ? ?Tob dep:  has set quit date for June 1st but feels she may need some help. At 3/4 pk/day.   Has tried patches and gum in past.  Also tried Wellbutrin but cause hallucinations.   ? ?Vit D def:  Vit D level was 27; previously 5.5. taking 2000 IU daily ? ?HM:  due for Shingrix vaccine.  Had COVID booster ?Patient Active Problem List  ? Diagnosis Date Noted  ? Genetic testing 11/13/2021  ? Family  history of pancreatic cancer 10/18/2021  ? Family history of breast cancer 10/18/2021  ? Malignant neoplasm of upper-outer quadrant of left breast in female, estrogen receptor positive ([place]) 10/16/2021  ? Mass of upper inner quadrant of left breast 09/14/2021  ? Tobacco dependence 09/14/2021  ? Normocytic anemia 09/14/2021  ? Vitamin D deficiency 09/14/2021  ? Aortic atherosclerosis ([place]) 09/14/2021  ? Anterior dislocation of left hip ([place]) 07/12/2021  ? Closed displaced fracture of greater trochanter of left femur ([place]) 07/12/2021  ? S/p left hip fracture 07/10/2021  ? Plantar fasciitis 02/19/2018  ? TIA (transient ischemic attack) 06/26/2016  ? Mixed hyperlipidemia 03/25/2015  ? Essential hypertension 03/24/2015  ? GERD (gastroesophageal reflux disease) 05/12/2011  ? Environmental allergies 05/12/2011  ?  ? ?Current Outpatient Medications on File Prior to Visit  ?Medication Sig Dispense Refill  ? acetaminophen (TYLENOL) 500 MG tablet Take 1,000 mg by mouth every 6 (six) hours as needed for mild pain.    ? amLODipine (NORVASC) 5 MG tablet Take 1 tablet (5 mg total) by mouth daily. 30 tablet 5  ? aspirin 81 MG EC tablet Take 1 tablet (81 mg total) by mouth daily. Swallow whole. 30 tablet 11  ? atorvastatin (LIPITOR) 10 MG tablet Take 1 tablet (10 mg total) by mouth daily. [employer]  tablet 3  ? cholecalciferol (VITAMIN D3) 25 MCG (1000 UNIT) tablet Take 2,000 Units by mouth daily.    ? docusate sodium (COLACE) 100 MG capsule Take 1 capsule (100 mg total) by mouth 2 (two) times daily as needed for mild constipation. 10 capsule 0  ? ibuprofen (ADVIL) 800 MG tablet Take 1 tablet (800 mg total) by mouth every 8 (eight) hours as needed. 30 tablet 0  ? Multiple Vitamins-Iron (MULTIVITAMINS WITH IRON) TABS tablet Take 1 tablet by mouth daily.  0  ? ?Current Facility-Administered Medications on File Prior to Visit  ?Medication Dose Route Frequency Provider Last Rate Last Admin  ? triamcinolone acetonide (KENALOG) 10 MG/ML injection  10 mg  10 mg Other Once Trula Slade, DPM      ? ? ?No Known Allergies ? ?Social History  ? ?Socioeconomic History  ? Marital status: Widowed  ?  Spouse name: Not on file  ? Number of children: 2  ? Years of education: Not on file  ? Highest education level: Not on file  ?Occupational History  ? Occupation: Retired Corporate treasurer  ?Tobacco Use  ? Smoking status: Every Day  ?  Packs/day: 1.00  ?  Years: 15.00  ?  Pack years: 15.00  ?  Types: Cigarettes  ? Smokeless tobacco: Never  ?Vaping Use  ? Vaping Use: Never used  ?Substance and Sexual Activity  ? Alcohol use: Not Currently  ? Drug use: Never  ? Sexual activity: Never  ?  Birth control/protection: Abstinence  ?Other Topics Concern  ? Not on file  ?Social History Narrative  ? ** Merged History Encounter **  ?    ? ?Social Determinants of Health  ? ?Financial Resource Strain: High Risk  ? Difficulty of Paying Living Expenses: Hard  ?Food Insecurity: Food Insecurity Present  ? Worried About Charity fundraiser in the Last Year: Often true  ? Ran Out of Food in the Last Year: Sometimes true  ?Transportation Needs: Unmet Transportation Needs  ? Lack of Transportation (Medical): Yes  ? Lack of Transportation (Non-Medical): No  ?Physical Activity: Not on file  ?Stress: Not on file  ?Social Connections: Not on file  ?Intimate Partner Violence: Not on file  ? ? ?Family History  ?Problem Relation Age of Onset  ? Pancreatic cancer Mother 37  ? Mesothelioma Father 54  ? Breast cancer Sister 60  ?     reports negative genetic testing  ? Colon polyps Sister   ?     precancerous  ? Breast cancer Sister   ?     dx. 56s  ? Pulmonary fibrosis Brother   ? CVA Maternal Grandfather   ? Breast cancer Maternal Great-grandmother   ? ? ?Past Surgical History:  ?Procedure Laterality Date  ? ABDOMINAL HYSTERECTOMY    ? APPENDECTOMY    ? appynedectomy    ? BREAST LUMPECTOMY WITH RADIOACTIVE SEED AND SENTINEL LYMPH NODE BIOPSY Left 11/16/2021  ? Procedure: LEFT BREAST LUMPECTOMY WITH RADIOACTIVE  SEED AND SENTINEL LYMPH NODE BIOPSY;  Surgeon: Erroll Luna, MD;  Location: [facility];  Service: General;  Laterality: Left;  ? CHOLECYSTECTOMY    ? FACIAL LACERATION REPAIR N/A 07/10/2021  ? Procedure: FACIAL LACERATION REPAIR;  Surgeon: Jesusita Oka, MD;  Location: [facility];  Service: General;  Laterality: N/A;  ? HIP CLOSED REDUCTION Left 07/10/2021  ? Procedure: ATTEMPTED CLOSED REDUCTION HIP, OPEN REDUCTION LEFT HIP;  Surgeon: Shona Needles, MD;  Location: [facility];  Service: Orthopedics;  Laterality: Left;  ? KNEE ARTHROSCOPY WITH MENISCAL REPAIR Right   ? KNEE SURGERY    ? ? ?ROS: ?Review of Systems ?Negative except as stated above ? ?PHYSICAL EXAM: ?BP 130/80   Pulse 84   Wt 164 lb 12.8 oz (74.8 kg)   SpO2 95%   BMI 26.20 kg/m?   ?Wt Readings from Last 3 Encounters:  ?01/11/22 164 lb 12.8 oz (74.8 kg)  ?12/13/21 (P) 165 lb 12.8 oz (75.2 kg)  ?12/08/21 165 lb 3.2 oz (74.9 kg)  ? ? ?Physical Exam ? ?General appearance - alert, well appearing, older Caucasian female in NAD.  And in no distress ?Mental status - normal mood, behavior, speech, dress, motor activity, and thought processes ?Neck - supple, no significant adenopathy ?Chest - clear to auscultation, no wheezes, rales or rhonchi, symmetric air entry ?Heart - normal rate, regular rhythm, normal S1, S2, no murmurs, rubs, clicks or gallops ?Extremities - peripheral pulses normal, no pedal edema, no clubbing or cyanosis ? ? ?  01/11/2022  ?  1:47 PM 09/14/2021  ?  9:37 AM  ?Depression screen PHQ 2/9  ?Decreased Interest 0 0  ?Down, Depressed, Hopeless 0 0  ?PHQ - 2 Score 0 0  ?Altered sleeping 2   ?Tired, decreased energy 2   ?Change in appetite 1   ?Feeling bad or failure about yourself  0   ?Trouble concentrating 0   ?Moving slowly or fidgety/restless 0   ?Suicidal thoughts 0   ?PHQ-9 Score 5   ? ? ?  01/11/2022  ?  1:47 PM 09/14/2021  ?  9:37 AM  ?GAD 7 : Generalized Anxiety Score  ?Nervous, Anxious, on Edge 1 0  ?Control/stop  worrying 0 0  ?Worry too much - different things 1 0  ?Trouble relaxing 1 0  ?Restless 0 0  ?Easily annoyed or irritable 1 0  ?Afraid - awful might happen 0 0  ?Total GAD 7 Score 4 0  ? ? ? ? ? ?  Latest Ref Rng

## 2022-01-12 ENCOUNTER — Other Ambulatory Visit: Payer: Self-pay

## 2022-01-12 ENCOUNTER — Ambulatory Visit
Admission: RE | Admit: 2022-01-12 | Discharge: 2022-01-12 | Disposition: A | Discharge status: Home / Self Care | Payer: 59 | Source: Ambulatory Visit | Attending: Radiation Oncology | Admitting: Radiation Oncology

## 2022-01-12 DIAGNOSIS — C50412 Malignant neoplasm of upper-outer quadrant of left female breast: Secondary | ICD-10-CM

## 2022-01-12 DIAGNOSIS — Z510 Encounter for antineoplastic radiation therapy: Secondary | ICD-10-CM | POA: Diagnosis not present

## 2022-01-12 DIAGNOSIS — Z170 Estrogen receptor positive status [ER+]: Secondary | ICD-10-CM | POA: Diagnosis not present

## 2022-01-12 LAB — RAD ONC ARIA SESSION SUMMARY
Course Elapsed Days: 3
Plan Fractions Treated to Date: 4
Plan Prescribed Dose Per Fraction: 2.66 Gy
Plan Total Fractions Prescribed: 16
Plan Total Prescribed Dose: 42.56 Gy
Reference Point Dosage Given to Date: 10.64 Gy
Reference Point Session Dosage Given: 2.66 Gy
Session Number: 4

## 2022-01-12 MED ORDER — RADIAPLEXRX EX GEL: Freq: Once | CUTANEOUS | Status: AC

## 2022-01-12 MED ORDER — ALRA NON-METALLIC DEODORANT (RAD-ONC)
1.0000 "application " | Freq: Once | TOPICAL | Status: AC
  Administered 2022-01-12: 1 via TOPICAL

## 2022-01-15 ENCOUNTER — Ambulatory Visit: Payer: 59

## 2022-01-16 ENCOUNTER — Other Ambulatory Visit: Payer: Self-pay

## 2022-01-16 ENCOUNTER — Ambulatory Visit
Admission: RE | Admit: 2022-01-16 | Discharge: 2022-01-16 | Disposition: A | Discharge status: Home / Self Care | Payer: 59 | Source: Ambulatory Visit | Attending: Radiation Oncology | Admitting: Radiation Oncology

## 2022-01-16 DIAGNOSIS — Z79899 Other long term (current) drug therapy: Secondary | ICD-10-CM | POA: Diagnosis not present

## 2022-01-16 DIAGNOSIS — C50412 Malignant neoplasm of upper-outer quadrant of left female breast: Secondary | ICD-10-CM | POA: Insufficient documentation

## 2022-01-16 DIAGNOSIS — Z170 Estrogen receptor positive status [ER+]: Secondary | ICD-10-CM | POA: Diagnosis not present

## 2022-01-16 DIAGNOSIS — F17210 Nicotine dependence, cigarettes, uncomplicated: Secondary | ICD-10-CM | POA: Diagnosis not present

## 2022-01-16 DIAGNOSIS — R69 Illness, unspecified: Secondary | ICD-10-CM | POA: Diagnosis not present

## 2022-01-16 DIAGNOSIS — Z510 Encounter for antineoplastic radiation therapy: Secondary | ICD-10-CM | POA: Diagnosis not present

## 2022-01-16 LAB — RAD ONC ARIA SESSION SUMMARY
Course Elapsed Days: 7
Plan Fractions Treated to Date: 5
Plan Prescribed Dose Per Fraction: 2.66 Gy
Plan Total Fractions Prescribed: 16
Plan Total Prescribed Dose: 42.56 Gy
Reference Point Dosage Given to Date: 13.3 Gy
Reference Point Session Dosage Given: 2.66 Gy
Session Number: 5

## 2022-01-17 ENCOUNTER — Ambulatory Visit
Admission: RE | Admit: 2022-01-17 | Discharge: 2022-01-17 | Disposition: A | Discharge status: Home / Self Care | Payer: 59 | Source: Ambulatory Visit | Attending: Radiation Oncology | Admitting: Radiation Oncology

## 2022-01-17 ENCOUNTER — Inpatient Hospital Stay: Payer: 59 | Attending: Hematology and Oncology

## 2022-01-17 ENCOUNTER — Other Ambulatory Visit: Payer: Self-pay

## 2022-01-17 DIAGNOSIS — Z510 Encounter for antineoplastic radiation therapy: Secondary | ICD-10-CM | POA: Diagnosis not present

## 2022-01-17 DIAGNOSIS — F17210 Nicotine dependence, cigarettes, uncomplicated: Secondary | ICD-10-CM | POA: Insufficient documentation

## 2022-01-17 DIAGNOSIS — Z79899 Other long term (current) drug therapy: Secondary | ICD-10-CM | POA: Insufficient documentation

## 2022-01-17 DIAGNOSIS — C50412 Malignant neoplasm of upper-outer quadrant of left female breast: Secondary | ICD-10-CM | POA: Insufficient documentation

## 2022-01-17 DIAGNOSIS — Z170 Estrogen receptor positive status [ER+]: Secondary | ICD-10-CM | POA: Diagnosis not present

## 2022-01-17 DIAGNOSIS — R69 Illness, unspecified: Secondary | ICD-10-CM | POA: Diagnosis not present

## 2022-01-17 LAB — RAD ONC ARIA SESSION SUMMARY
Course Elapsed Days: 8
Plan Fractions Treated to Date: 6
Plan Prescribed Dose Per Fraction: 2.66 Gy
Plan Total Fractions Prescribed: 16
Plan Total Prescribed Dose: 42.56 Gy
Reference Point Dosage Given to Date: 15.96 Gy
Reference Point Session Dosage Given: 2.66 Gy
Session Number: 6

## 2022-01-18 ENCOUNTER — Inpatient Hospital Stay: Payer: 59

## 2022-01-18 ENCOUNTER — Ambulatory Visit
Admission: RE | Admit: 2022-01-18 | Discharge: 2022-01-18 | Disposition: A | Discharge status: Home / Self Care | Payer: 59 | Source: Ambulatory Visit | Attending: Radiation Oncology | Admitting: Radiation Oncology

## 2022-01-18 ENCOUNTER — Other Ambulatory Visit: Payer: Self-pay

## 2022-01-18 DIAGNOSIS — Z79899 Other long term (current) drug therapy: Secondary | ICD-10-CM | POA: Diagnosis not present

## 2022-01-18 DIAGNOSIS — C50412 Malignant neoplasm of upper-outer quadrant of left female breast: Secondary | ICD-10-CM | POA: Diagnosis not present

## 2022-01-18 DIAGNOSIS — Z510 Encounter for antineoplastic radiation therapy: Secondary | ICD-10-CM | POA: Diagnosis not present

## 2022-01-18 DIAGNOSIS — Z170 Estrogen receptor positive status [ER+]: Secondary | ICD-10-CM | POA: Diagnosis not present

## 2022-01-18 DIAGNOSIS — R69 Illness, unspecified: Secondary | ICD-10-CM | POA: Diagnosis not present

## 2022-01-18 LAB — RAD ONC ARIA SESSION SUMMARY
Course Elapsed Days: 9
Plan Fractions Treated to Date: 7
Plan Prescribed Dose Per Fraction: 2.66 Gy
Plan Total Fractions Prescribed: 16
Plan Total Prescribed Dose: 42.56 Gy
Reference Point Dosage Given to Date: 18.62 Gy
Reference Point Session Dosage Given: 2.66 Gy
Session Number: 7

## 2022-01-19 ENCOUNTER — Ambulatory Visit: Payer: 59

## 2022-01-22 ENCOUNTER — Ambulatory Visit
Admission: RE | Admit: 2022-01-22 | Discharge: 2022-01-22 | Disposition: A | Discharge status: Home / Self Care | Payer: 59 | Source: Ambulatory Visit | Attending: Radiation Oncology | Admitting: Radiation Oncology

## 2022-01-22 ENCOUNTER — Other Ambulatory Visit: Payer: Self-pay

## 2022-01-22 DIAGNOSIS — C50412 Malignant neoplasm of upper-outer quadrant of left female breast: Secondary | ICD-10-CM | POA: Diagnosis not present

## 2022-01-22 DIAGNOSIS — Z79899 Other long term (current) drug therapy: Secondary | ICD-10-CM | POA: Diagnosis not present

## 2022-01-22 DIAGNOSIS — Z170 Estrogen receptor positive status [ER+]: Secondary | ICD-10-CM | POA: Diagnosis not present

## 2022-01-22 DIAGNOSIS — Z510 Encounter for antineoplastic radiation therapy: Secondary | ICD-10-CM | POA: Diagnosis not present

## 2022-01-22 DIAGNOSIS — R69 Illness, unspecified: Secondary | ICD-10-CM | POA: Diagnosis not present

## 2022-01-22 LAB — RAD ONC ARIA SESSION SUMMARY
Course Elapsed Days: 13
Plan Fractions Treated to Date: 8
Plan Prescribed Dose Per Fraction: 2.66 Gy
Plan Total Fractions Prescribed: 16
Plan Total Prescribed Dose: 42.56 Gy
Reference Point Dosage Given to Date: 21.28 Gy
Reference Point Session Dosage Given: 2.66 Gy
Session Number: 8

## 2022-01-23 ENCOUNTER — Other Ambulatory Visit: Payer: Self-pay

## 2022-01-23 ENCOUNTER — Ambulatory Visit
Admission: RE | Admit: 2022-01-23 | Discharge: 2022-01-23 | Disposition: A | Discharge status: Home / Self Care | Payer: 59 | Source: Ambulatory Visit | Attending: Radiation Oncology | Admitting: Radiation Oncology

## 2022-01-23 DIAGNOSIS — Z79899 Other long term (current) drug therapy: Secondary | ICD-10-CM | POA: Diagnosis not present

## 2022-01-23 DIAGNOSIS — Z510 Encounter for antineoplastic radiation therapy: Secondary | ICD-10-CM | POA: Diagnosis not present

## 2022-01-23 DIAGNOSIS — C50412 Malignant neoplasm of upper-outer quadrant of left female breast: Secondary | ICD-10-CM | POA: Diagnosis not present

## 2022-01-23 DIAGNOSIS — Z170 Estrogen receptor positive status [ER+]: Secondary | ICD-10-CM | POA: Diagnosis not present

## 2022-01-23 DIAGNOSIS — R69 Illness, unspecified: Secondary | ICD-10-CM | POA: Diagnosis not present

## 2022-01-23 LAB — RAD ONC ARIA SESSION SUMMARY
Course Elapsed Days: 14
Plan Fractions Treated to Date: 9
Plan Prescribed Dose Per Fraction: 2.66 Gy
Plan Total Fractions Prescribed: 16
Plan Total Prescribed Dose: 42.56 Gy
Reference Point Dosage Given to Date: 23.94 Gy
Reference Point Session Dosage Given: 2.66 Gy
Session Number: 9

## 2022-01-24 ENCOUNTER — Other Ambulatory Visit: Payer: Self-pay

## 2022-01-24 ENCOUNTER — Ambulatory Visit
Admission: RE | Admit: 2022-01-24 | Discharge: 2022-01-24 | Disposition: A | Discharge status: Home / Self Care | Payer: 59 | Source: Ambulatory Visit | Attending: Radiation Oncology | Admitting: Radiation Oncology

## 2022-01-24 DIAGNOSIS — R69 Illness, unspecified: Secondary | ICD-10-CM | POA: Diagnosis not present

## 2022-01-24 DIAGNOSIS — C50412 Malignant neoplasm of upper-outer quadrant of left female breast: Secondary | ICD-10-CM | POA: Diagnosis not present

## 2022-01-24 DIAGNOSIS — Z170 Estrogen receptor positive status [ER+]: Secondary | ICD-10-CM | POA: Diagnosis not present

## 2022-01-24 DIAGNOSIS — Z79899 Other long term (current) drug therapy: Secondary | ICD-10-CM | POA: Diagnosis not present

## 2022-01-24 DIAGNOSIS — Z510 Encounter for antineoplastic radiation therapy: Secondary | ICD-10-CM | POA: Diagnosis not present

## 2022-01-24 LAB — RAD ONC ARIA SESSION SUMMARY
Course Elapsed Days: 15
Plan Fractions Treated to Date: 10
Plan Prescribed Dose Per Fraction: 2.66 Gy
Plan Total Fractions Prescribed: 16
Plan Total Prescribed Dose: 42.56 Gy
Reference Point Dosage Given to Date: 26.6 Gy
Reference Point Session Dosage Given: 2.66 Gy
Session Number: 10

## 2022-01-25 ENCOUNTER — Ambulatory Visit
Admission: RE | Admit: 2022-01-25 | Discharge: 2022-01-25 | Disposition: A | Discharge status: Home / Self Care | Payer: 59 | Source: Ambulatory Visit | Attending: Radiation Oncology | Admitting: Radiation Oncology

## 2022-01-25 ENCOUNTER — Other Ambulatory Visit: Payer: Self-pay

## 2022-01-25 DIAGNOSIS — R69 Illness, unspecified: Secondary | ICD-10-CM | POA: Diagnosis not present

## 2022-01-25 DIAGNOSIS — Z170 Estrogen receptor positive status [ER+]: Secondary | ICD-10-CM | POA: Diagnosis not present

## 2022-01-25 DIAGNOSIS — Z510 Encounter for antineoplastic radiation therapy: Secondary | ICD-10-CM | POA: Diagnosis not present

## 2022-01-25 DIAGNOSIS — C50412 Malignant neoplasm of upper-outer quadrant of left female breast: Secondary | ICD-10-CM | POA: Diagnosis not present

## 2022-01-25 DIAGNOSIS — Z79899 Other long term (current) drug therapy: Secondary | ICD-10-CM | POA: Diagnosis not present

## 2022-01-25 LAB — RAD ONC ARIA SESSION SUMMARY
Course Elapsed Days: 16
Plan Fractions Treated to Date: 11
Plan Prescribed Dose Per Fraction: 2.66 Gy
Plan Total Fractions Prescribed: 16
Plan Total Prescribed Dose: 42.56 Gy
Reference Point Dosage Given to Date: 29.26 Gy
Reference Point Session Dosage Given: 2.66 Gy
Session Number: 11

## 2022-01-26 ENCOUNTER — Ambulatory Visit
Admission: RE | Admit: 2022-01-26 | Discharge: 2022-01-26 | Disposition: A | Discharge status: Home / Self Care | Payer: 59 | Source: Ambulatory Visit | Attending: Radiation Oncology | Admitting: Radiation Oncology

## 2022-01-26 ENCOUNTER — Ambulatory Visit: Payer: 59 | Admitting: Radiation Oncology

## 2022-01-26 ENCOUNTER — Other Ambulatory Visit: Payer: Self-pay

## 2022-01-26 DIAGNOSIS — R69 Illness, unspecified: Secondary | ICD-10-CM | POA: Diagnosis not present

## 2022-01-26 DIAGNOSIS — C50412 Malignant neoplasm of upper-outer quadrant of left female breast: Secondary | ICD-10-CM | POA: Diagnosis not present

## 2022-01-26 DIAGNOSIS — Z170 Estrogen receptor positive status [ER+]: Secondary | ICD-10-CM | POA: Diagnosis not present

## 2022-01-26 DIAGNOSIS — Z79899 Other long term (current) drug therapy: Secondary | ICD-10-CM | POA: Diagnosis not present

## 2022-01-26 DIAGNOSIS — Z510 Encounter for antineoplastic radiation therapy: Secondary | ICD-10-CM | POA: Diagnosis not present

## 2022-01-26 LAB — RAD ONC ARIA SESSION SUMMARY
Course Elapsed Days: 17
Plan Fractions Treated to Date: 12
Plan Prescribed Dose Per Fraction: 2.66 Gy
Plan Total Fractions Prescribed: 16
Plan Total Prescribed Dose: 42.56 Gy
Reference Point Dosage Given to Date: 31.92 Gy
Reference Point Session Dosage Given: 2.66 Gy
Session Number: 12

## 2022-01-29 ENCOUNTER — Other Ambulatory Visit: Payer: Self-pay

## 2022-01-29 ENCOUNTER — Ambulatory Visit
Admission: RE | Admit: 2022-01-29 | Discharge: 2022-01-29 | Disposition: A | Discharge status: Home / Self Care | Payer: 59 | Source: Ambulatory Visit | Attending: Radiation Oncology | Admitting: Radiation Oncology

## 2022-01-29 DIAGNOSIS — Z79899 Other long term (current) drug therapy: Secondary | ICD-10-CM | POA: Diagnosis not present

## 2022-01-29 DIAGNOSIS — Z510 Encounter for antineoplastic radiation therapy: Secondary | ICD-10-CM | POA: Diagnosis not present

## 2022-01-29 DIAGNOSIS — Z170 Estrogen receptor positive status [ER+]: Secondary | ICD-10-CM | POA: Diagnosis not present

## 2022-01-29 DIAGNOSIS — R69 Illness, unspecified: Secondary | ICD-10-CM | POA: Diagnosis not present

## 2022-01-29 DIAGNOSIS — C50412 Malignant neoplasm of upper-outer quadrant of left female breast: Secondary | ICD-10-CM | POA: Diagnosis not present

## 2022-01-29 LAB — RAD ONC ARIA SESSION SUMMARY
Course Elapsed Days: 20
Plan Fractions Treated to Date: 13
Plan Prescribed Dose Per Fraction: 2.66 Gy
Plan Total Fractions Prescribed: 16
Plan Total Prescribed Dose: 42.56 Gy
Reference Point Dosage Given to Date: 34.58 Gy
Reference Point Session Dosage Given: 2.66 Gy
Session Number: 13

## 2022-01-30 ENCOUNTER — Other Ambulatory Visit: Payer: Self-pay

## 2022-01-30 ENCOUNTER — Ambulatory Visit
Admission: RE | Admit: 2022-01-30 | Discharge: 2022-01-30 | Disposition: A | Discharge status: Home / Self Care | Payer: 59 | Source: Ambulatory Visit | Attending: Radiation Oncology | Admitting: Radiation Oncology

## 2022-01-30 DIAGNOSIS — R69 Illness, unspecified: Secondary | ICD-10-CM | POA: Diagnosis not present

## 2022-01-30 DIAGNOSIS — Z510 Encounter for antineoplastic radiation therapy: Secondary | ICD-10-CM | POA: Diagnosis not present

## 2022-01-30 DIAGNOSIS — Z170 Estrogen receptor positive status [ER+]: Secondary | ICD-10-CM | POA: Diagnosis not present

## 2022-01-30 DIAGNOSIS — Z79899 Other long term (current) drug therapy: Secondary | ICD-10-CM | POA: Diagnosis not present

## 2022-01-30 DIAGNOSIS — C50412 Malignant neoplasm of upper-outer quadrant of left female breast: Secondary | ICD-10-CM | POA: Diagnosis not present

## 2022-01-30 LAB — RAD ONC ARIA SESSION SUMMARY
Course Elapsed Days: 21
Plan Fractions Treated to Date: 14
Plan Prescribed Dose Per Fraction: 2.66 Gy
Plan Total Fractions Prescribed: 16
Plan Total Prescribed Dose: 42.56 Gy
Reference Point Dosage Given to Date: 37.24 Gy
Reference Point Session Dosage Given: 2.66 Gy
Session Number: 14

## 2022-01-31 ENCOUNTER — Ambulatory Visit
Admission: RE | Admit: 2022-01-31 | Discharge: 2022-01-31 | Disposition: A | Discharge status: Home / Self Care | Payer: 59 | Source: Ambulatory Visit | Attending: Radiation Oncology | Admitting: Radiation Oncology

## 2022-01-31 ENCOUNTER — Ambulatory Visit: Payer: 59

## 2022-01-31 ENCOUNTER — Other Ambulatory Visit: Payer: Self-pay

## 2022-01-31 ENCOUNTER — Inpatient Hospital Stay: Payer: 59

## 2022-01-31 DIAGNOSIS — Z510 Encounter for antineoplastic radiation therapy: Secondary | ICD-10-CM | POA: Diagnosis not present

## 2022-01-31 DIAGNOSIS — C50412 Malignant neoplasm of upper-outer quadrant of left female breast: Secondary | ICD-10-CM | POA: Diagnosis not present

## 2022-01-31 DIAGNOSIS — R69 Illness, unspecified: Secondary | ICD-10-CM | POA: Diagnosis not present

## 2022-01-31 DIAGNOSIS — Z170 Estrogen receptor positive status [ER+]: Secondary | ICD-10-CM | POA: Diagnosis not present

## 2022-01-31 DIAGNOSIS — Z79899 Other long term (current) drug therapy: Secondary | ICD-10-CM | POA: Diagnosis not present

## 2022-01-31 LAB — RAD ONC ARIA SESSION SUMMARY
Course Elapsed Days: 22
Plan Fractions Treated to Date: 15
Plan Prescribed Dose Per Fraction: 2.66 Gy
Plan Total Fractions Prescribed: 16
Plan Total Prescribed Dose: 42.56 Gy
Reference Point Dosage Given to Date: 39.9 Gy
Reference Point Session Dosage Given: 2.66 Gy
Session Number: 15

## 2022-02-01 ENCOUNTER — Ambulatory Visit: Payer: 59 | Admitting: Hematology and Oncology

## 2022-02-01 ENCOUNTER — Ambulatory Visit: Payer: 59

## 2022-02-01 ENCOUNTER — Other Ambulatory Visit: Payer: Self-pay

## 2022-02-01 DIAGNOSIS — C50412 Malignant neoplasm of upper-outer quadrant of left female breast: Secondary | ICD-10-CM | POA: Diagnosis not present

## 2022-02-01 DIAGNOSIS — R69 Illness, unspecified: Secondary | ICD-10-CM | POA: Diagnosis not present

## 2022-02-01 DIAGNOSIS — Z510 Encounter for antineoplastic radiation therapy: Secondary | ICD-10-CM | POA: Diagnosis not present

## 2022-02-01 DIAGNOSIS — Z170 Estrogen receptor positive status [ER+]: Secondary | ICD-10-CM | POA: Diagnosis not present

## 2022-02-01 DIAGNOSIS — Z79899 Other long term (current) drug therapy: Secondary | ICD-10-CM | POA: Diagnosis not present

## 2022-02-01 LAB — RAD ONC ARIA SESSION SUMMARY
Course Elapsed Days: 23
Plan Fractions Treated to Date: 16
Plan Prescribed Dose Per Fraction: 2.66 Gy
Plan Total Fractions Prescribed: 16
Plan Total Prescribed Dose: 42.56 Gy
Reference Point Dosage Given to Date: 42.56 Gy
Reference Point Session Dosage Given: 2.66 Gy
Session Number: 16

## 2022-02-02 ENCOUNTER — Ambulatory Visit
Admission: RE | Admit: 2022-02-02 | Discharge: 2022-02-02 | Disposition: A | Discharge status: Home / Self Care | Payer: 59 | Source: Ambulatory Visit | Attending: Radiation Oncology | Admitting: Radiation Oncology

## 2022-02-02 ENCOUNTER — Other Ambulatory Visit: Payer: Self-pay

## 2022-02-02 ENCOUNTER — Inpatient Hospital Stay (HOSPITAL_BASED_OUTPATIENT_CLINIC_OR_DEPARTMENT_OTHER): Payer: 59 | Admitting: Hematology and Oncology

## 2022-02-02 ENCOUNTER — Encounter: Payer: Self-pay | Admitting: Hematology and Oncology

## 2022-02-02 DIAGNOSIS — Z170 Estrogen receptor positive status [ER+]: Secondary | ICD-10-CM

## 2022-02-02 DIAGNOSIS — R69 Illness, unspecified: Secondary | ICD-10-CM | POA: Diagnosis not present

## 2022-02-02 DIAGNOSIS — Z79899 Other long term (current) drug therapy: Secondary | ICD-10-CM | POA: Diagnosis not present

## 2022-02-02 DIAGNOSIS — C50412 Malignant neoplasm of upper-outer quadrant of left female breast: Secondary | ICD-10-CM | POA: Diagnosis not present

## 2022-02-02 DIAGNOSIS — Z510 Encounter for antineoplastic radiation therapy: Secondary | ICD-10-CM | POA: Diagnosis not present

## 2022-02-02 LAB — RAD ONC ARIA SESSION SUMMARY
Course Elapsed Days: 24
Plan Fractions Treated to Date: 1
Plan Prescribed Dose Per Fraction: 2.5 Gy
Plan Total Fractions Prescribed: 4
Plan Total Prescribed Dose: 10 Gy
Reference Point Dosage Given to Date: 45.06 Gy
Reference Point Session Dosage Given: 2.5 Gy
Session Number: 17

## 2022-02-02 MED ORDER — RADIAPLEXRX EX GEL: Freq: Once | CUTANEOUS | Status: AC

## 2022-02-02 MED ORDER — ANASTROZOLE 1 MG PO TABS: 1.0000 mg | ORAL_TABLET | Freq: Every day | ORAL | 0 refills | Status: DC

## 2022-02-02 NOTE — Assessment & Plan Note (Signed)
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  Given ER/PR strongly positive and relatively small size tumor with no associated lymph nodes, we agreed to proceed with lumpectomy first followed by adjuvant radiation and Oncotype testing. Oncotype score of 4, no benefit of chemotherapy, distant risk of recurrence at 9 years with AI or Tam alone is 3%.    She is here to discuss about antiestrogen therapy once again given she is completing radiation soon.  We have once again discussed about timing of action of tamoxifen versus aromatase inhibitors.  We have discussed about adverse effects of tamoxifen including but not limited to postmenopausal symptoms, increased risk of DVT/PE, increased risk of endometrial cancer, benefit on bone density apart from reducing risk of breast cancer recurrence.  Since she is an active smoker, her risk of DVT/PE might be higher than non-smoker population.  We have discussed about adverse effects of aromatase inhibitors including but not limited to postmenopausal symptoms, bone loss, arthralgias/myalgias.  She is inclined towards aromatase inhibitors.  Prescription for anastrozole has been dispensed to the pharmacy of her choice  Return to clinic in 3 months for toxicity check

## 2022-02-02 NOTE — Progress Notes (Signed)
[place] NOTE  Patient Care Team: Ladell Pier, MD as PCP - General (Internal Medicine) Patient, No Pcp Per (Inactive) (General Practice) Erroll Luna, MD as Consulting Physician (General Surgery) Benay Pike, MD as Consulting Physician (Hematology and Oncology) Kyung Rudd, MD as Consulting Physician (Radiation Oncology) Mauro Kaufmann, RN as Oncology Nurse Navigator Rockwell Germany, RN as Oncology Nurse Navigator  CHIEF COMPLAINTS/PURPOSE OF CONSULTATION:  Newly diagnosed breast cancer  HISTORY OF PRESENTING ILLNESS:  Annette Richard 66 y.o. female is here because of recent diagnosis of left sided breast cancer.   SUMMARY OF ONCOLOGIC HISTORY: Oncology History  Malignant neoplasm of upper-outer quadrant of left breast in female, estrogen receptor positive ([place])  10/16/2021 Initial Diagnosis   Malignant neoplasm of upper-outer quadrant of left breast in female, estrogen receptor positive ([place])    10/18/2021 Cancer Staging   Staging form: Breast, AJCC 8th Edition - Clinical: Stage IB (cT2, cN0, cM0, G2, ER+, PR+, HER2-) - Signed by Hayden Pedro, PA-C on 10/18/2021 Method of lymph node assessment: Clinical Histologic grading system: 3 grade system     Genetic Testing   [facility] CancerNext-Expanded Panel was Negative. Report date is 11/13/2021.  The CancerNext-Expanded gene panel offered by [facility] and includes sequencing, rearrangement, and RNA analysis for the following 77 genes: AIP, ALK, APC, ATM, AXIN2, BAP1, BARD1, BLM, BMPR1A, BRCA1, BRCA2, BRIP1, CDC73, CDH1, CDK4, CDKN1B, CDKN2A, CHEK2, CTNNA1, DICER1, FANCC, FH, FLCN, GALNT12, KIF1B, LZTR1, MAX, MEN1, MET, MLH1, MSH2, MSH3, MSH6, MUTYH, NBN, NF1, NF2, NTHL1, PALB2, PHOX2B, PMS2, POT1, PRKAR1A, PTCH1, PTEN, RAD51C, RAD51D, RB1, RECQL, RET, SDHA, SDHAF2, SDHB, SDHC, SDHD, SMAD4, SMARCA4, SMARCB1, SMARCE1, STK11, SUFU, TMEM127, TP53, TSC1, TSC2, VHL and XRCC2 (sequencing and  deletion/duplication); EGFR, EGLN1, HOXB13, KIT, MITF, PDGFRA, POLD1, and POLE (sequencing only); EPCAM and GREM1 (deletion/duplication only).    11/16/2021 Surgery   She underwent left breast seed localized lumpectomy with left axillary sentinel lymph node mapping. Surgical pathology showed invasive ductal carcinoma, grade 2, 3.6 cm in maximal extent, tumor approaches to less than 1 mm from closest resection margin, DCIS, intermediate grade, no metastatic carcinoma in the any of the lymph nodes.  Prior prognostic showed ER 100% positive, strong staining, PR 100% positive, strong staining, HER2 negative and Ki-67 of 5%   12/07/2021 Oncotype testing   Oncotype of 4, group salute chemotherapy benefit less than 1%.  Distant recurrence risk at 9 years with AI or Tam is 3%.    Interval history Annette Richard is here for follow-up.  She has 3 more days of radiation and she is currently undergoing the boost phase.  She has tolerated it well except for expected skin changes, erythema.  No concerns for infection. Rest of the pertinent 10 point ROS reviewed and negative  MEDICAL HISTORY:  Past Medical History:  Diagnosis Date   Arthritis    Breast cancer ([place])     SURGICAL HISTORY: Past Surgical History:  Procedure Laterality Date   ABDOMINAL HYSTERECTOMY     APPENDECTOMY     appynedectomy     BREAST LUMPECTOMY WITH RADIOACTIVE SEED AND SENTINEL LYMPH NODE BIOPSY Left 11/16/2021   Procedure: LEFT BREAST LUMPECTOMY WITH RADIOACTIVE SEED AND SENTINEL LYMPH NODE BIOPSY;  Surgeon: Erroll Luna, MD;  Location: [place];  Service: General;  Laterality: Left;   CHOLECYSTECTOMY     FACIAL LACERATION REPAIR N/A 07/10/2021   Procedure: FACIAL LACERATION REPAIR;  Surgeon: Jesusita Oka, MD;  Location: [place];  Service: General;  Laterality: N/A;   HIP CLOSED REDUCTION Left 07/10/2021   Procedure: ATTEMPTED CLOSED REDUCTION HIP, OPEN REDUCTION LEFT HIP;  Surgeon: Shona Needles, MD;   Location: [place];  Service: Orthopedics;  Laterality: Left;   KNEE ARTHROSCOPY WITH MENISCAL REPAIR Right    KNEE SURGERY      SOCIAL HISTORY: Social History   Socioeconomic History   Marital status: Widowed    Spouse name: Not on file   Number of children: 2   Years of education: Not on file   Highest education level: Not on file  Occupational History   Occupation: Retired LPN  Tobacco Use   Smoking status: Every Day    Packs/day: 1.00    Years: 15.00    Pack years: 15.00    Types: Cigarettes   Smokeless tobacco: Never  Vaping Use   Vaping Use: Never used  Substance and Sexual Activity   Alcohol use: Not Currently   Drug use: Never   Sexual activity: Never    Birth control/protection: Abstinence  Other Topics Concern   Not on file  Social History Narrative   ** Merged History Encounter **       Social Determinants of Health   Financial Resource Strain: High Risk   Difficulty of Paying Living Expenses: Hard  Food Insecurity: Food Insecurity Present   Worried About Charity fundraiser in the Last Year: Often true   Ran Out of Food in the Last Year: Sometimes true  Transportation Needs: Unmet Transportation Needs   Lack of Transportation (Medical): Yes   Lack of Transportation (Non-Medical): No  Physical Activity: Not on file  Stress: Not on file  Social Connections: Not on file  Intimate Partner Violence: Not on file    FAMILY HISTORY: Family History  Problem Relation Age of Onset   Pancreatic cancer Mother 20   Mesothelioma Father 60   Breast cancer Sister 27       reports negative genetic testing   Colon polyps Sister        precancerous   Breast cancer Sister        dx. 60s   Pulmonary fibrosis Brother    CVA Maternal Grandfather    Breast cancer Maternal Great-grandmother     ALLERGIES:  has No Known Allergies.  MEDICATIONS:  Current Outpatient Medications  Medication Sig Dispense Refill   anastrozole (ARIMIDEX) 1 MG tablet Take 1 tablet (1  mg total) by mouth daily. 90 tablet 0   acetaminophen (TYLENOL) 500 MG tablet Take 1,000 mg by mouth every 6 (six) hours as needed for mild pain.     amLODipine (NORVASC) 5 MG tablet Take 1 tablet (5 mg total) by mouth daily. 30 tablet 5   aspirin 81 MG EC tablet Take 1 tablet (81 mg total) by mouth daily. Swallow whole. 30 tablet 11   atorvastatin (LIPITOR) 10 MG tablet Take 1 tablet (10 mg total) by mouth daily. 30 tablet 3   cholecalciferol (VITAMIN D3) 25 MCG (1000 UNIT) tablet Take 2,000 Units by mouth daily.     docusate sodium (COLACE) 100 MG capsule Take 1 capsule (100 mg total) by mouth 2 (two) times daily as needed for mild constipation. 10 capsule 0   ibuprofen (ADVIL) 800 MG tablet Take 1 tablet (800 mg total) by mouth every 8 (eight) hours as needed. 30 tablet 0   Multiple Vitamins-Iron (MULTIVITAMINS WITH IRON) TABS tablet Take 1 tablet by mouth daily.  0   nicotine (NICODERM  CQ - DOSED IN MG/24 HOURS) 21 mg/24hr patch Place 1 patch (21 mg total) onto the skin daily. 28 patch 1   Current Facility-Administered Medications  Medication Dose Route Frequency Provider Last Rate Last Admin   triamcinolone acetonide (KENALOG) 10 MG/ML injection 10 mg  10 mg Other Once Trula Slade, DPM        PHYSICAL EXAMINATION: ECOG PERFORMANCE STATUS: 0 - Asymptomatic  Vitals:   02/02/22 1319  BP: 131/82  Pulse: (!) 102  Resp: 16  Temp: 97.8 F (36.6 C)  SpO2: 98%    Filed Weights   02/02/22 1319  Weight: 167 lb 8 oz (76 kg)     GENERAL:alert, no distress and comfortable Left breast lumpectomy with postradiation changes, no concern for infection  LABORATORY DATA:  I have reviewed the data as listed Lab Results  Component Value Date   WBC 8.5 10/18/2021   HGB 13.3 10/18/2021   HCT 40.4 10/18/2021   MCV 86.5 10/18/2021   PLT 325 10/18/2021   Lab Results  Component Value Date   NA 139 10/18/2021   K 3.7 10/18/2021   CL 104 10/18/2021   CO2 27 10/18/2021     RADIOGRAPHIC STUDIES: I have personally reviewed the radiological reports and agreed with the findings in the report.  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  Given ER/PR strongly positive and relatively small size tumor with no associated lymph nodes, we agreed to proceed with lumpectomy first followed by adjuvant radiation and Oncotype testing. Oncotype score of 4, no benefit of chemotherapy, distant risk of recurrence at 9 years with AI or Tam alone is 3%.    She is here to discuss about antiestrogen therapy once again given she is completing radiation soon.  We have once again discussed about timing of action of tamoxifen versus aromatase inhibitors.  We have discussed about adverse effects of tamoxifen including but not limited to postmenopausal symptoms, increased risk of DVT/PE, increased risk of endometrial cancer, benefit on bone density apart from reducing risk of breast cancer recurrence.  Since she is an active smoker, her risk of DVT/PE might be higher than non-smoker population.  We have discussed about adverse effects of aromatase inhibitors including but not limited to postmenopausal symptoms, bone loss, arthralgias/myalgias.  She is inclined towards aromatase inhibitors.  Prescription for anastrozole has been dispensed to the pharmacy of her choice  Return to clinic in 3 months for toxicity check  Total time spent: 30 minutes All questions were answered. The patient knows to call the clinic with any problems, questions or concerns.    Benay Pike, MD 02/02/22

## 2022-02-05 ENCOUNTER — Other Ambulatory Visit: Payer: Self-pay

## 2022-02-05 ENCOUNTER — Encounter: Payer: Self-pay | Admitting: *Deleted

## 2022-02-05 ENCOUNTER — Ambulatory Visit
Admission: RE | Admit: 2022-02-05 | Discharge: 2022-02-05 | Disposition: A | Discharge status: Home / Self Care | Payer: 59 | Source: Ambulatory Visit | Attending: Radiation Oncology | Admitting: Radiation Oncology

## 2022-02-05 ENCOUNTER — Ambulatory Visit: Payer: 59

## 2022-02-05 DIAGNOSIS — Z170 Estrogen receptor positive status [ER+]: Secondary | ICD-10-CM | POA: Diagnosis not present

## 2022-02-05 DIAGNOSIS — Z510 Encounter for antineoplastic radiation therapy: Secondary | ICD-10-CM | POA: Diagnosis not present

## 2022-02-05 DIAGNOSIS — Z79899 Other long term (current) drug therapy: Secondary | ICD-10-CM | POA: Diagnosis not present

## 2022-02-05 DIAGNOSIS — C50412 Malignant neoplasm of upper-outer quadrant of left female breast: Secondary | ICD-10-CM | POA: Diagnosis not present

## 2022-02-05 DIAGNOSIS — R69 Illness, unspecified: Secondary | ICD-10-CM | POA: Diagnosis not present

## 2022-02-05 LAB — RAD ONC ARIA SESSION SUMMARY
Course Elapsed Days: 27
Plan Fractions Treated to Date: 2
Plan Prescribed Dose Per Fraction: 2.5 Gy
Plan Total Fractions Prescribed: 4
Plan Total Prescribed Dose: 10 Gy
Reference Point Dosage Given to Date: 47.56 Gy
Reference Point Session Dosage Given: 2.5 Gy
Session Number: 18

## 2022-02-06 ENCOUNTER — Other Ambulatory Visit: Payer: Self-pay

## 2022-02-06 ENCOUNTER — Ambulatory Visit: Payer: 59

## 2022-02-06 ENCOUNTER — Ambulatory Visit
Admission: RE | Admit: 2022-02-06 | Discharge: 2022-02-06 | Disposition: A | Discharge status: Home / Self Care | Payer: 59 | Source: Ambulatory Visit | Attending: Radiation Oncology | Admitting: Radiation Oncology

## 2022-02-06 DIAGNOSIS — Z510 Encounter for antineoplastic radiation therapy: Secondary | ICD-10-CM | POA: Diagnosis not present

## 2022-02-06 DIAGNOSIS — C50412 Malignant neoplasm of upper-outer quadrant of left female breast: Secondary | ICD-10-CM | POA: Diagnosis not present

## 2022-02-06 DIAGNOSIS — R69 Illness, unspecified: Secondary | ICD-10-CM | POA: Diagnosis not present

## 2022-02-06 DIAGNOSIS — Z170 Estrogen receptor positive status [ER+]: Secondary | ICD-10-CM | POA: Diagnosis not present

## 2022-02-06 DIAGNOSIS — Z79899 Other long term (current) drug therapy: Secondary | ICD-10-CM | POA: Diagnosis not present

## 2022-02-06 LAB — RAD ONC ARIA SESSION SUMMARY
Course Elapsed Days: 28
Plan Fractions Treated to Date: 3
Plan Prescribed Dose Per Fraction: 2.5 Gy
Plan Total Fractions Prescribed: 4
Plan Total Prescribed Dose: 10 Gy
Reference Point Dosage Given to Date: 50.06 Gy
Reference Point Session Dosage Given: 2.5 Gy
Session Number: 19

## 2022-02-07 ENCOUNTER — Ambulatory Visit
Admission: RE | Admit: 2022-02-07 | Discharge: 2022-02-07 | Disposition: A | Discharge status: Home / Self Care | Payer: 59 | Source: Ambulatory Visit | Attending: Radiation Oncology | Admitting: Radiation Oncology

## 2022-02-07 ENCOUNTER — Other Ambulatory Visit: Payer: Self-pay

## 2022-02-07 ENCOUNTER — Encounter: Payer: Self-pay | Admitting: Radiation Oncology

## 2022-02-07 DIAGNOSIS — C50412 Malignant neoplasm of upper-outer quadrant of left female breast: Secondary | ICD-10-CM | POA: Diagnosis not present

## 2022-02-07 DIAGNOSIS — Z79899 Other long term (current) drug therapy: Secondary | ICD-10-CM | POA: Diagnosis not present

## 2022-02-07 DIAGNOSIS — Z510 Encounter for antineoplastic radiation therapy: Secondary | ICD-10-CM | POA: Diagnosis not present

## 2022-02-07 DIAGNOSIS — Z170 Estrogen receptor positive status [ER+]: Secondary | ICD-10-CM | POA: Diagnosis not present

## 2022-02-07 DIAGNOSIS — R69 Illness, unspecified: Secondary | ICD-10-CM | POA: Diagnosis not present

## 2022-02-07 LAB — RAD ONC ARIA SESSION SUMMARY
Course Elapsed Days: 29
Plan Fractions Treated to Date: 4
Plan Prescribed Dose Per Fraction: 2.5 Gy
Plan Total Fractions Prescribed: 4
Plan Total Prescribed Dose: 10 Gy
Reference Point Dosage Given to Date: 52.56 Gy
Reference Point Session Dosage Given: 2.5 Gy
Session Number: 20

## 2022-02-08 NOTE — Progress Notes (Signed)
                                                                                                                                                             Patient Name: Annette Richard MRN: [id number] DOB: 1956-06-08 Referring Physician: Benay Pike Date of Service: 02/07/2022 [facility], [place]                                                        End Of Treatment Note  Diagnoses: C50.412-Malignant neoplasm of upper-outer quadrant of left female breast  Cancer Staging:   Stage IB, cT2N0M0, grade 1-2, ER/PR positive invasive ductal carcinoma of the left breast  Intent: Curative  Radiation Treatment Dates: 01/09/2022 through 02/07/2022 Site Technique Total Dose (Gy) Dose per Fx (Gy) Completed Fx Beam Energies  Breast, Left: Breast_L 3D 42.56/42.56 2.66 16/16 10X, 6XFFF  Breast, Left: Breast_L_Bst 3D 10/10 2.5 4/4 10X   Narrative: The patient tolerated radiation therapy relatively well. She developed anticipated skin changes in the treatment field.   Plan: The patient will receive a call in about one month from the radiation oncology department. She will continue follow up with Dr. Chryl Heck as well.   ________________________________________________    Carola Rhine, [facility]
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
## 2022-02-22 ENCOUNTER — Ambulatory Visit: Payer: 59 | Attending: Surgery

## 2022-02-22 DIAGNOSIS — Z483 Aftercare following surgery for neoplasm: Secondary | ICD-10-CM | POA: Insufficient documentation
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
## 2022-02-26 ENCOUNTER — Other Ambulatory Visit: Payer: Self-pay | Admitting: Internal Medicine

## 2022-02-26 DIAGNOSIS — I700 Atherosclerosis of aorta: Secondary | ICD-10-CM

## 2022-02-27 NOTE — Telephone Encounter (Signed)
Requested Prescriptions  Pending Prescriptions Disp Refills  . atorvastatin (LIPITOR) 10 MG tablet [Pharmacy Med Name: ATORVASTATIN 10 MG TABLET] 90 tablet 1    Sig: TAKE 1 TABLET BY MOUTH EVERY DAY     Cardiovascular:  Antilipid - Statins Failed - 02/26/2022  2:09 AM      Failed - Lipid Panel in normal range within the last 12 months    Cholesterol, Total  Date Value Ref Range Status  09/14/2021 254 (H) 100 - 199 mg/dL Final   LDL Chol Calc (NIH)  Date Value Ref Range Status  09/14/2021 173 (H) 0 - 99 mg/dL Final   HDL  Date Value Ref Range Status  09/14/2021 48 >39 mg/dL Final   Triglycerides  Date Value Ref Range Status  09/14/2021 176 (H) 0 - 149 mg/dL Final         Passed - Patient is not pregnant      Passed - Valid encounter within last 12 months    Recent Outpatient Visits          1 month ago Essential hypertension   [place], MD   4 months ago Aortic atherosclerosis [facility])   [place], Jarome Matin, RPH-CPP   5 months ago Establishing care with new doctor, encounter for   [place], MD      Future Appointments            In 2 months Wynetta Emery Dalbert Batman, MD [place]

## 2022-03-01 ENCOUNTER — Other Ambulatory Visit: Payer: Self-pay | Admitting: Internal Medicine

## 2022-03-01 DIAGNOSIS — I10 Essential (primary) hypertension: Secondary | ICD-10-CM

## 2022-03-01 NOTE — Telephone Encounter (Signed)
Requested Prescriptions  Pending Prescriptions Disp Refills  . amLODipine (NORVASC) 5 MG tablet [Pharmacy Med Name: AMLODIPINE BESYLATE 5 MG TAB] 30 tablet 5    Sig: TAKE 1 TABLET (5 MG TOTAL) BY MOUTH DAILY.     Cardiovascular: Calcium Channel Blockers 2 Passed - 03/01/2022  2:09 AM      Passed - Last BP in normal range    BP Readings from Last 1 Encounters:  02/02/22 131/82         Passed - Last Heart Rate in normal range    Pulse Readings from Last 1 Encounters:  02/02/22 (!) 102         Passed - Valid encounter within last 6 months    Recent Outpatient Visits          1 month ago Essential hypertension   [place], MD   4 months ago Aortic atherosclerosis [facility])   [place], Jarome Matin, RPH-CPP   5 months ago Establishing care with new doctor, encounter for   [place], MD      Future Appointments            In 2 months Wynetta Emery Dalbert Batman, MD [place]

## 2022-03-03 ENCOUNTER — Other Ambulatory Visit: Payer: Self-pay | Admitting: Internal Medicine

## 2022-03-03 DIAGNOSIS — F17200 Nicotine dependence, unspecified, uncomplicated: Secondary | ICD-10-CM

## 2022-03-05 NOTE — Telephone Encounter (Signed)
Requested Prescriptions  Pending Prescriptions Disp Refills  . nicotine (NICODERM CQ - DOSED IN MG/24 HOURS) 21 mg/24hr patch [Pharmacy Med Name: NICOTINE 21 MG/24HR PATCH] 28 patch 1    Sig: PLACE 1 PATCH ONTO THE SKIN DAILY.     Psychiatry:  Drug Dependence Therapy Passed - 03/03/2022  8:43 AM      Passed - Valid encounter within last 12 months    Recent Outpatient Visits          1 month ago Essential hypertension   [place], MD   4 months ago Aortic atherosclerosis [facility])   [place], Jarome Matin, RPH-CPP   5 months ago Establishing care with new doctor, encounter for   [place], MD      Future Appointments            In 2 months Wynetta Emery Dalbert Batman, MD [place]

## 2022-03-09 DIAGNOSIS — Z170 Estrogen receptor positive status [ER+]: Secondary | ICD-10-CM | POA: Diagnosis not present

## 2022-03-09 DIAGNOSIS — C50412 Malignant neoplasm of upper-outer quadrant of left female breast: Secondary | ICD-10-CM | POA: Diagnosis not present

## 2022-03-19 DIAGNOSIS — C50412 Malignant neoplasm of upper-outer quadrant of left female breast: Secondary | ICD-10-CM | POA: Diagnosis not present

## 2022-03-19 DIAGNOSIS — Z170 Estrogen receptor positive status [ER+]: Secondary | ICD-10-CM | POA: Diagnosis not present

## 2022-03-19 DIAGNOSIS — N644 Mastodynia: Secondary | ICD-10-CM | POA: Diagnosis not present

## 2022-03-26 ENCOUNTER — Ambulatory Visit
Admission: RE | Admit: 2022-03-26 | Discharge: 2022-03-26 | Disposition: A | Discharge status: Home / Self Care | Payer: 59 | Source: Ambulatory Visit | Attending: Adult Health | Admitting: Adult Health

## 2022-03-26 DIAGNOSIS — Z170 Estrogen receptor positive status [ER+]: Secondary | ICD-10-CM

## 2022-03-26 NOTE — Progress Notes (Signed)
  Radiation Oncology         [phone] ________________________________  Name: Annette Richard MRN: [id number]  Date of Service: 03/26/2022  DOB: July 15, 1956  Post Treatment Telephone Note  Diagnosis:   Stage IB, cT2N0M0, grade 1-2, ER/PR positive invasive ductal carcinoma of the left breast  Intent: Curative  Radiation Treatment Dates: 01/09/2022 through 02/07/2022 Site Technique Total Dose (Gy) Dose per Fx (Gy) Completed Fx Beam Energies  Breast, Left: Breast_L 3D 42.56/42.56 2.66 16/16 10X, 6XFFF  Breast, Left: Breast_L_Bst 3D 10/10 2.5 4/4 10X   Narrative: The patient tolerated radiation therapy relatively well. She developed anticipated skin changes in the treatment field.    Impression/Plan: 1. Stage IB, cT2N0M0, grade 1-2, ER/PR positive invasive ductal carcinoma of the left breast.  I was unable to reach the patient but left a voicemail and on the message, I discussed that we would be happy to continue to follow her as needed, but she will also continue to follow up with Dr. Chryl Heck in medical oncology. She was counseled to call if she had questions about skin care or measures to avoid sun exposure to this area.        Carola Rhine, PAC

## 2022-04-23 DIAGNOSIS — N644 Mastodynia: Secondary | ICD-10-CM | POA: Diagnosis not present

## 2022-04-23 DIAGNOSIS — Z9889 Other specified postprocedural states: Secondary | ICD-10-CM | POA: Diagnosis not present

## 2022-05-07 ENCOUNTER — Other Ambulatory Visit: Payer: Self-pay | Admitting: Hematology and Oncology

## 2022-05-17 ENCOUNTER — Ambulatory Visit: Payer: 59 | Admitting: Internal Medicine

## 2022-05-30 ENCOUNTER — Other Ambulatory Visit: Payer: Self-pay | Admitting: *Deleted

## 2022-05-30 DIAGNOSIS — C50412 Malignant neoplasm of upper-outer quadrant of left female breast: Secondary | ICD-10-CM

## 2022-05-31 ENCOUNTER — Inpatient Hospital Stay: Payer: 59 | Attending: Adult Health

## 2022-05-31 ENCOUNTER — Inpatient Hospital Stay (HOSPITAL_BASED_OUTPATIENT_CLINIC_OR_DEPARTMENT_OTHER): Payer: 59 | Admitting: Adult Health

## 2022-05-31 ENCOUNTER — Other Ambulatory Visit: Payer: Self-pay

## 2022-05-31 ENCOUNTER — Encounter: Payer: Self-pay | Admitting: Adult Health

## 2022-05-31 VITALS — BP 148/92 | HR 65 | Temp 97.8°F | Resp 18 | Ht 66.0 in | Wt 172.2 lb

## 2022-05-31 DIAGNOSIS — F17210 Nicotine dependence, cigarettes, uncomplicated: Secondary | ICD-10-CM | POA: Insufficient documentation

## 2022-05-31 DIAGNOSIS — Z170 Estrogen receptor positive status [ER+]: Secondary | ICD-10-CM | POA: Insufficient documentation

## 2022-05-31 DIAGNOSIS — C50412 Malignant neoplasm of upper-outer quadrant of left female breast: Secondary | ICD-10-CM | POA: Diagnosis not present

## 2022-05-31 DIAGNOSIS — Z923 Personal history of irradiation: Secondary | ICD-10-CM | POA: Insufficient documentation

## 2022-05-31 DIAGNOSIS — E2839 Other primary ovarian failure: Secondary | ICD-10-CM

## 2022-05-31 DIAGNOSIS — Z79899 Other long term (current) drug therapy: Secondary | ICD-10-CM | POA: Insufficient documentation

## 2022-05-31 DIAGNOSIS — R69 Illness, unspecified: Secondary | ICD-10-CM | POA: Diagnosis not present

## 2022-05-31 LAB — CMP (CANCER CENTER ONLY)
ALT: 11 U/L (ref 0–44)
AST: 15 U/L (ref 15–41)
Albumin: 4.6 g/dL (ref 3.5–5.0)
Alkaline Phosphatase: 101 U/L (ref 38–126)
Anion gap: 5 (ref 5–15)
BUN: 10 mg/dL (ref 8–23)
CO2: 30 mmol/L (ref 22–32)
Calcium: 10.3 mg/dL (ref 8.9–10.3)
Chloride: 106 mmol/L (ref 98–111)
Creatinine: 0.86 mg/dL (ref 0.44–1.00)
GFR, Estimated: >60 mL/min (ref >60)
Glucose, Bld: 95 mg/dL (ref 70–99)
Potassium: 4.3 mmol/L (ref 3.5–5.1)
Sodium: 141 mmol/L (ref 135–145)
Total Bilirubin: 0.2 mg/dL — ABNORMAL LOW (ref 0.3–1.2)
Total Protein: 7.5 g/dL (ref 6.5–8.1)

## 2022-05-31 LAB — CBC WITH DIFFERENTIAL (CANCER CENTER ONLY)
Abs Immature Granulocytes: 0.02 10*3/uL (ref 0.00–0.07)
Basophils Absolute: 0.1 10*3/uL (ref 0.0–0.1)
Basophils Relative: 1 %
Eosinophils Absolute: 0.4 10*3/uL (ref 0.0–0.5)
Eosinophils Relative: 5 %
HCT: 41.5 % (ref 36.0–46.0)
Hemoglobin: 14 g/dL (ref 12.0–15.0)
Immature Granulocytes: 0 %
Lymphocytes Relative: 25 %
Lymphs Abs: 1.9 10*3/uL (ref 0.7–4.0)
MCH: 29.6 pg (ref 26.0–34.0)
MCHC: 33.7 g/dL (ref 30.0–36.0)
MCV: 87.7 fL (ref 80.0–100.0)
Monocytes Absolute: 0.4 10*3/uL (ref 0.1–1.0)
Monocytes Relative: 6 %
Neutro Abs: 4.8 10*3/uL (ref 1.7–7.7)
Neutrophils Relative %: 63 %
Platelet Count: 244 10*3/uL (ref 150–400)
RBC: 4.73 MIL/uL (ref 3.87–5.11)
RDW: 12.9 % (ref 11.5–15.5)
WBC Count: 7.6 10*3/uL (ref 4.0–10.5)
nRBC: 0 % (ref 0.0–0.2)

## 2022-05-31 NOTE — Progress Notes (Signed)
SURVIVORSHIP VISIT:   BRIEF ONCOLOGIC HISTORY:  Oncology History  Malignant neoplasm of upper-outer quadrant of left breast in female, estrogen receptor positive ([place])  10/16/2021 Initial Diagnosis   Malignant neoplasm of upper-outer quadrant of left breast in female, estrogen receptor positive ([place])   10/18/2021 Cancer Staging   Staging form: Breast, AJCC 8th Edition - Clinical: Stage IB (cT2, cN0, cM0, G2, ER+, PR+, HER2-) - Signed by Hayden Pedro, PA-C on 10/18/2021 Method of lymph node assessment: Clinical Histologic grading system: 3 grade system    Genetic Testing   [facility] CancerNext-Expanded Panel was Negative. Report date is 11/13/2021.  The CancerNext-Expanded gene panel offered by [facility] and includes sequencing, rearrangement, and RNA analysis for the following 77 genes: AIP, ALK, APC, ATM, AXIN2, BAP1, BARD1, BLM, BMPR1A, BRCA1, BRCA2, BRIP1, CDC73, CDH1, CDK4, CDKN1B, CDKN2A, CHEK2, CTNNA1, DICER1, FANCC, FH, FLCN, GALNT12, KIF1B, LZTR1, MAX, MEN1, MET, MLH1, MSH2, MSH3, MSH6, MUTYH, NBN, NF1, NF2, NTHL1, PALB2, PHOX2B, PMS2, POT1, PRKAR1A, PTCH1, PTEN, RAD51C, RAD51D, RB1, RECQL, RET, SDHA, SDHAF2, SDHB, SDHC, SDHD, SMAD4, SMARCA4, SMARCB1, SMARCE1, STK11, SUFU, TMEM127, TP53, TSC1, TSC2, VHL and XRCC2 (sequencing and deletion/duplication); EGFR, EGLN1, HOXB13, KIT, MITF, PDGFRA, POLD1, and POLE (sequencing only); EPCAM and GREM1 (deletion/duplication only).    11/16/2021 Surgery   She underwent left breast seed localized lumpectomy with left axillary sentinel lymph node mapping. Surgical pathology showed invasive ductal carcinoma, grade 2, 3.6 cm in maximal extent, tumor approaches to less than 1 mm from closest resection margin, DCIS, intermediate grade, no metastatic carcinoma in the any of the lymph nodes.  Prior prognostic showed ER 100% positive, strong staining, PR 100% positive, strong staining, HER2 negative and Ki-67 of 5%   12/07/2021 Oncotype testing    Oncotype of 4, group salute chemotherapy benefit less than 1%.  Distant recurrence risk at 9 years with AI or Tam is 3%.   01/09/2022 - 02/07/2022 Radiation Therapy   Site Technique Total Dose (Gy) Dose per Fx (Gy) Completed Fx Beam Energies  Breast, Left: Breast_L 3D 42.56/42.56 2.66 16/16 10X, 6XFFF  Breast, Left: Breast_L_Bst 3D 10/10 2.5 4/4 10X     01/2022 -  Anti-estrogen oral therapy   Anastrozole     INTERVAL HISTORY:  Annette Richard to review her survivorship care plan detailing her treatment course for breast cancer, as well as monitoring long-term side effects of that treatment, education regarding health maintenance, screening, and overall wellness and health promotion.     Overall, Annette Richard reports feeling quite well.  She is taking anastrozole daily. Left breast wound dehiscence with MRSA infection requiring antibiotics and drained and has not yet healed.  Monitored by Dr. Brantley Stage.  Not leaking pus any longer.  + fatigue, tolerable hot flashes that are intermittent.  Sluggish thinking and difficulty remembering things at baseline.     REVIEW OF SYSTEMS:  Review of Systems  Constitutional:  Positive for fatigue. Negative for appetite change, chills, fever and unexpected weight change.  HENT:   Negative for hearing loss, lump/mass and trouble swallowing.   Eyes:  Negative for eye problems and icterus.  Respiratory:  Negative for chest tightness, cough and shortness of breath.   Cardiovascular:  Negative for chest pain, leg swelling and palpitations.  Gastrointestinal:  Negative for abdominal distention, abdominal pain, constipation, diarrhea, nausea and vomiting.  Endocrine: Positive for hot flashes.  Genitourinary:  Negative for difficulty urinating.   Musculoskeletal:  Negative for arthralgias.  Skin:  Positive for wound. Negative for itching and rash.  Neurological:  Negative for dizziness, extremity weakness, headaches and numbness.  Hematological:  Negative for  adenopathy. Does not bruise/bleed easily.  Psychiatric/Behavioral:  Negative for depression. The patient is not nervous/anxious.    Breast: Denies any new nodularity, masses, tenderness, nipple changes, or nipple discharge.      ONCOLOGY TREATMENT TEAM:  1. Surgeon:  Dr. Brantley Stage at [facility] 2. Medical Oncologist: Dr. Chryl Heck  3. Radiation Oncologist: Dr. Lisbeth Renshaw    PAST MEDICAL/SURGICAL HISTORY:  Past Medical History:  Diagnosis Date   Arthritis    Breast cancer [facility])    Past Surgical History:  Procedure Laterality Date   ABDOMINAL HYSTERECTOMY     APPENDECTOMY     appynedectomy     BREAST LUMPECTOMY WITH RADIOACTIVE SEED AND SENTINEL LYMPH NODE BIOPSY Left 11/16/2021   Procedure: LEFT BREAST LUMPECTOMY WITH RADIOACTIVE SEED AND SENTINEL LYMPH NODE BIOPSY;  Surgeon: Erroll Luna, MD;  Location: [place];  Service: General;  Laterality: Left;   CHOLECYSTECTOMY     FACIAL LACERATION REPAIR N/A 07/10/2021   Procedure: FACIAL LACERATION REPAIR;  Surgeon: Jesusita Oka, MD;  Location: [place];  Service: General;  Laterality: N/A;   HIP CLOSED REDUCTION Left 07/10/2021   Procedure: ATTEMPTED CLOSED REDUCTION HIP, OPEN REDUCTION LEFT HIP;  Surgeon: Shona Needles, MD;  Location: [place];  Service: Orthopedics;  Laterality: Left;   KNEE ARTHROSCOPY WITH MENISCAL REPAIR Right    KNEE SURGERY       ALLERGIES:  No Known Allergies   CURRENT MEDICATIONS:  Outpatient Encounter Medications as of 05/31/2022  Medication Sig   amLODipine (NORVASC) 5 MG tablet TAKE 1 TABLET (5 MG TOTAL) BY MOUTH DAILY.   anastrozole (ARIMIDEX) 1 MG tablet TAKE 1 TABLET BY MOUTH EVERY DAY   aspirin 81 MG EC tablet Take 1 tablet (81 mg total) by mouth daily. Swallow whole.   atorvastatin (LIPITOR) 10 MG tablet TAKE 1 TABLET BY MOUTH EVERY DAY   cholecalciferol (VITAMIN D3) 25 MCG (1000 UNIT) tablet Take 2,000 Units by mouth daily.   ibuprofen (ADVIL) 800 MG tablet Take 1  tablet (800 mg total) by mouth every 8 (eight) hours as needed.   Multiple Vitamins-Iron (MULTIVITAMINS WITH IRON) TABS tablet Take 1 tablet by mouth daily.   nicotine (NICODERM CQ - DOSED IN MG/24 HOURS) 21 mg/24hr patch PLACE 1 PATCH ONTO THE SKIN DAILY.   acetaminophen (TYLENOL) 500 MG tablet Take 1,000 mg by mouth every 6 (six) hours as needed for mild pain. (Patient not taking: Reported on 05/31/2022)   docusate sodium (COLACE) 100 MG capsule Take 1 capsule (100 mg total) by mouth 2 (two) times daily as needed for mild constipation. (Patient not taking: Reported on 05/31/2022)   Facility-Administered Encounter Medications as of 05/31/2022  Medication   triamcinolone acetonide (KENALOG) 10 MG/ML injection 10 mg     ONCOLOGIC FAMILY HISTORY:  Family History  Problem Relation Age of Onset   Pancreatic cancer Mother 17   Mesothelioma Father 78   Breast cancer Sister 71       reports negative genetic testing   Colon polyps Sister        precancerous   Breast cancer Sister        dx. 75s   Pulmonary fibrosis Brother    CVA Maternal Grandfather    Breast cancer Maternal Great-grandmother      SOCIAL HISTORY:  Social History   Socioeconomic History   Marital status: Widowed    Spouse name:  Not on file   Number of children: 2   Years of education: Not on file   Highest education level: Not on file  Occupational History   Occupation: Retired LPN  Tobacco Use   Smoking status: Every Day    Packs/day: 1.00    Years: 15.00    Total pack years: 15.00    Types: Cigarettes   Smokeless tobacco: Never  Vaping Use   Vaping Use: Never used  Substance and Sexual Activity   Alcohol use: Not Currently   Drug use: Never   Sexual activity: Never    Birth control/protection: Abstinence  Other Topics Concern   Not on file  Social History Narrative   ** Merged History Encounter **       Social Determinants of Health   Financial Resource Strain: High Risk (10/18/2021)   Overall  Financial Resource Strain (CARDIA)    Difficulty of Paying Living Expenses: Hard  Food Insecurity: Food Insecurity Present (10/18/2021)   Hunger Vital Sign    Worried About Running Out of Food in the Last Year: Often true    Ran Out of Food in the Last Year: Sometimes true  Transportation Needs: Unmet Transportation Needs (01/02/2022)   PRAPARE - Hydrologist (Medical): Yes    Lack of Transportation (Non-Medical): No  Physical Activity: Not on file  Stress: Not on file  Social Connections: Not on file  Intimate Partner Violence: Not on file     OBSERVATIONS/OBJECTIVE:  BP (!) 148/92 (BP Location: Right Arm, Patient Position: Sitting)   Pulse 65   Temp 97.8 F (36.6 C) (Temporal)   Resp 18   Ht $R'5\' 6"'sF$  (1.676 m)   Wt 172 lb 3.2 oz (78.1 kg)   SpO2 100%   BMI 27.79 kg/m  GENERAL: Patient is a well appearing female in no acute distress HEENT:  Sclerae anicteric.  Oropharynx clear and moist. No ulcerations or evidence of oropharyngeal candidiasis. Neck is supple.  NODES:  No cervical, supraclavicular, or axillary lymphadenopathy palpated.  BREAST EXAM: Left breast status postlumpectomy and radiation.  There is a small area that has erythema surrounding it and the skin is slightly thicker underneath there is no fluctuance or sign of abscess the area is not swollen or warm.  The area is not tender.  There is no sign of breast cancer recurrence.  The right breast is benign. LUNGS:  Clear to auscultation bilaterally.  No wheezes or rhonchi. HEART:  Regular rate and rhythm. No murmur appreciated. ABDOMEN:  Soft, nontender.  Positive, normoactive bowel sounds. No organomegaly palpated. MSK:  No focal spinal tenderness to palpation. Full range of motion bilaterally in the upper extremities. EXTREMITIES:  No peripheral edema.   SKIN:  Clear with no obvious rashes or skin changes. No nail dyscrasia. NEURO:  Nonfocal. Well oriented.  Appropriate affect.  LABORATORY  DATA:  None for this visit.  DIAGNOSTIC IMAGING:  None for this visit.      ASSESSMENT AND PLAN:  Ms.. Richard is a pleasant 66 y.o. 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  She presents to the Survivorship Clinic for our initial meeting and routine follow-up post-completion of treatment for breast cancer.    1. Stage 1B left breast cancer:  Annette Richard is continuing to recover from definitive treatment for breast cancer. She will follow-up with her medical  oncologist, Dr. Chryl Heck in 6 months with history and physical exam per surveillance protocol.  She will continue her anti-estrogen therapy with anastrozole. Thus far, she is tolerating the anastrozole well, with minimal side effects. Her mammogram is due January 2024; orders placed today.   Today, a comprehensive survivorship care plan and treatment summary was reviewed with the patient today detailing her breast cancer diagnosis, treatment course, potential late/long-term effects of treatment, appropriate follow-up care with recommendations for the future, and patient education resources.  A copy of this summary, along with a letter will be sent to the patient's primary care provider via mail/fax/In Basket message after today's visit.    2.  Previous left breast dehiscence: This area appears to be healing.  She will continue to monitor the area and will follow with Dr. Brantley Stage about it should it worsen.  3. Bone health:  Given Annette Richard's age/history of breast cancer and her current treatment regimen including anti-estrogen therapy with anastrozole, she is at risk for bone demineralization.  She has not undergone bone density testing and is willing to do so for the next month however if she cannot get in before October 1 she would like to wait until January as  her insurance will change.  She was given education on specific activities to promote bone health.  4. Cancer screening:  Due to Annette Richard's history and her age, she should receive screening for skin cancers, colon cancer, lung cancer and gynecologic cancers.  The information and recommendations are listed on the patient's comprehensive care plan/treatment summary and were reviewed in detail with the patient.    5. Health maintenance and wellness promotion: Annette Richard was encouraged to consume 5-7 servings of fruits and vegetables per day. We reviewed the "Nutrition Rainbow" handout.  She was also encouraged to engage in moderate to vigorous exercise for 30 minutes per day most days of the week. We discussed the [employer] fitness program, which is designed for cancer survivors to help them become more physically fit after cancer treatments.  She was instructed to limit her alcohol consumption and continue to abstain from tobacco use.     6. Support services/counseling: It is not uncommon for this period of the patient's cancer care trajectory to be one of many emotions and stressors.   She was given information regarding our available services and encouraged to contact me with any questions or for help enrolling in any of our support group/programs.    Follow up instructions:    -Return to cancer center in 6 months for follow-up -Mammogram due in January 2024 -Bone density testing -Follow up with surgery 1 year -She is welcome to return back to the Survivorship Clinic at any time; no additional follow-up needed at this time.  -Consider referral back to survivorship as a long-term survivor for continued surveillance  The patient was provided an opportunity to ask questions and all were answered. The patient agreed with the plan and demonstrated an understanding of the instructions.   Total encounter time:40 minutes*in face-to-face visit time, chart review, lab review, care  coordination, order entry, and documentation of the encounter time.    Wilber Bihari, NP 05/31/22 11:13 AM Medical Oncology and Hematology [facility] [place], [place] [address] Tel. [phone]    Fax. [phone]  *Total Encounter Time as defined by the Centers for Medicare and Medicaid Services includes, in addition to the face-to-face time of a patient visit (documented in the note above) non-face-to-face time:  obtaining and reviewing outside history, ordering and reviewing medications, tests or procedures, care coordination (communications with other health care professionals or caregivers) and documentation in the medical record.

## 2022-06-11 ENCOUNTER — Ambulatory Visit: Payer: 59

## 2022-06-12 ENCOUNTER — Ambulatory Visit (HOSPITAL_BASED_OUTPATIENT_CLINIC_OR_DEPARTMENT_OTHER): Payer: 59

## 2022-06-13 ENCOUNTER — Ambulatory Visit (HOSPITAL_BASED_OUTPATIENT_CLINIC_OR_DEPARTMENT_OTHER)
Admission: RE | Admit: 2022-06-13 | Discharge: 2022-06-13 | Disposition: A | Discharge status: Home / Self Care | Payer: 59 | Source: Ambulatory Visit | Attending: Adult Health | Admitting: Adult Health

## 2022-06-13 DIAGNOSIS — E2839 Other primary ovarian failure: Secondary | ICD-10-CM | POA: Insufficient documentation

## 2022-06-13 DIAGNOSIS — M85851 Other specified disorders of bone density and structure, right thigh: Secondary | ICD-10-CM | POA: Diagnosis not present

## 2022-06-14 ENCOUNTER — Telehealth: Payer: Self-pay

## 2022-06-14 NOTE — Telephone Encounter (Signed)
Called pt to give recommendations based on DEXA results per NP. Pt verbalized thanks and understanding.

## 2022-06-14 NOTE — Telephone Encounter (Signed)
-----   Message from Gardenia Phlegm, NP sent at 06/13/2022  4:03 PM EDT ----- Regarding: FW: Bone density shows osteopenia.  Calcium, vitamin d and weight bearing exercises are recommended.  I recommend repeat in 2 years. ----- Message ----- From: Interface, Rad Results In Sent: 06/13/2022  10:53 AM EDT To: Gardenia Phlegm, NP

## 2022-06-18 ENCOUNTER — Ambulatory Visit: Payer: 59 | Attending: Surgery

## 2022-06-18 VITALS — Wt 172.2 lb

## 2022-06-18 DIAGNOSIS — Z483 Aftercare following surgery for neoplasm: Secondary | ICD-10-CM | POA: Insufficient documentation
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
## 2022-07-20 ENCOUNTER — Telehealth: Payer: Self-pay | Admitting: Adult Health

## 2022-07-20 NOTE — Telephone Encounter (Addendum)
Return call to Pt from triage call, stating she was not feeling well and had been vomiting last night. Pt stated she actually passed out in the bathroom. Informed Pt she should go to the ER to get checked out to make sure everything is ok. 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 Asked Pt if she is drinking small sips to stay hydrated. Pt stated she is drinking water and taking Zofran. She stated she called because she had an appointment with Floyce Stakes and Sharee Pimple, and had to cancel Informed her we received that message and again if anything should change to please go to the emergency room. Pt verbalized understanding.

## 2022-08-02 ENCOUNTER — Other Ambulatory Visit: Payer: Self-pay | Admitting: Hematology and Oncology

## 2022-08-06 ENCOUNTER — Encounter: Payer: Self-pay | Admitting: Adult Health

## 2022-08-29 ENCOUNTER — Telehealth: Payer: Self-pay | Admitting: *Deleted

## 2022-08-29 ENCOUNTER — Other Ambulatory Visit: Payer: Self-pay | Admitting: *Deleted

## 2022-08-29 DIAGNOSIS — Z170 Estrogen receptor positive status [ER+]: Secondary | ICD-10-CM

## 2022-08-29 NOTE — Telephone Encounter (Signed)
Patient requested order for mammogram sent to [facility] as she's had all other mammograms there. She has appt there on 09/19/22.  Order for Mammography with [facility] discontinued and order placed for Mammography with [facility] per patient request.  Order faxed to [facility] @ [phone]. Fax confirmation received

## 2022-09-17 HISTORY — PX: CATARACT EXTRACTION W/ INTRAOCULAR LENS IMPLANT: SHX1309

## 2022-09-24 ENCOUNTER — Ambulatory Visit: Payer: Medicare Other | Attending: Surgery

## 2022-09-24 VITALS — Wt 173.0 lb

## 2022-09-24 DIAGNOSIS — Z483 Aftercare following surgery for neoplasm: Secondary | ICD-10-CM | POA: Insufficient documentation
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
## 2022-09-27 ENCOUNTER — Other Ambulatory Visit: Payer: Self-pay | Admitting: Internal Medicine

## 2022-09-27 DIAGNOSIS — I10 Essential (primary) hypertension: Secondary | ICD-10-CM

## 2022-09-27 DIAGNOSIS — I700 Atherosclerosis of aorta: Secondary | ICD-10-CM

## 2022-09-27 NOTE — Telephone Encounter (Signed)
Requested Prescriptions  Pending Prescriptions Disp Refills   atorvastatin (LIPITOR) 10 MG tablet [Pharmacy Med Name: ATORVASTATIN 10 MG TABLET] 90 tablet 0    Sig: TAKE 1 TABLET BY MOUTH EVERY DAY     Cardiovascular:  Antilipid - Statins Failed - 09/27/2022  1:40 AM      Failed - Lipid Panel in normal range within the last 12 months    Cholesterol, Total  Date Value Ref Range Status  09/14/2021 254 (H) 100 - 199 mg/dL Final   LDL Chol Calc (NIH)  Date Value Ref Range Status  09/14/2021 173 (H) 0 - 99 mg/dL Final   HDL  Date Value Ref Range Status  09/14/2021 48 >39 mg/dL Final   Triglycerides  Date Value Ref Range Status  09/14/2021 176 (H) 0 - 149 mg/dL Final         Passed - Patient is not pregnant      Passed - Valid encounter within last 12 months    Recent Outpatient Visits           8 months ago Essential hypertension   [place], MD   11 months ago Aortic atherosclerosis [facility])   [employer], Jarome Matin, RPH-CPP   1 year ago Establishing care with new doctor, encounter for   [place], MD       Future Appointments             In 3 weeks Ladell Pier, MD [place]             amLODipine (NORVASC) 5 MG tablet [Pharmacy Med Name: AMLODIPINE BESYLATE 5 MG TAB] 90 tablet 0    Sig: TAKE 1 TABLET (5 MG TOTAL) BY MOUTH DAILY.     Cardiovascular: Calcium Channel Blockers 2 Failed - 09/27/2022  1:40 AM      Failed - Last BP in normal range    BP Readings from Last 1 Encounters:  05/31/22 (!) 148/92         Failed - Valid encounter within last 6 months    Recent Outpatient Visits           8 months ago Essential hypertension   [place], MD   11 months ago Aortic atherosclerosis [facility])   [place], RPH-CPP   1 year ago Establishing care with new doctor, encounter for   [place], MD       Future Appointments             In 3 weeks Ladell Pier, MD [place] in normal range    Pulse Readings from Last 1 Encounters:  05/31/22 65

## 2022-10-18 ENCOUNTER — Ambulatory Visit: Payer: Medicare Other | Attending: Internal Medicine | Admitting: Internal Medicine

## 2022-10-18 ENCOUNTER — Encounter: Payer: Self-pay | Admitting: Internal Medicine

## 2022-10-18 VITALS — BP 123/81 | HR 84 | Temp 98.2°F | Ht 66.0 in | Wt 173.0 lb

## 2022-10-18 DIAGNOSIS — Z853 Personal history of malignant neoplasm of breast: Secondary | ICD-10-CM | POA: Diagnosis not present

## 2022-10-18 DIAGNOSIS — M79642 Pain in left hand: Secondary | ICD-10-CM | POA: Diagnosis present

## 2022-10-18 DIAGNOSIS — M2550 Pain in unspecified joint: Secondary | ICD-10-CM | POA: Diagnosis not present

## 2022-10-18 DIAGNOSIS — H539 Unspecified visual disturbance: Secondary | ICD-10-CM | POA: Diagnosis not present

## 2022-10-18 DIAGNOSIS — Z79899 Other long term (current) drug therapy: Secondary | ICD-10-CM | POA: Diagnosis not present

## 2022-10-18 DIAGNOSIS — M85851 Other specified disorders of bone density and structure, right thigh: Secondary | ICD-10-CM

## 2022-10-18 DIAGNOSIS — M85852 Other specified disorders of bone density and structure, left thigh: Secondary | ICD-10-CM

## 2022-10-18 DIAGNOSIS — E782 Mixed hyperlipidemia: Secondary | ICD-10-CM

## 2022-10-18 DIAGNOSIS — I700 Atherosclerosis of aorta: Secondary | ICD-10-CM | POA: Diagnosis not present

## 2022-10-18 DIAGNOSIS — Z23 Encounter for immunization: Secondary | ICD-10-CM | POA: Diagnosis not present

## 2022-10-18 DIAGNOSIS — M25542 Pain in joints of left hand: Secondary | ICD-10-CM | POA: Insufficient documentation

## 2022-10-18 DIAGNOSIS — M8589 Other specified disorders of bone density and structure, multiple sites: Secondary | ICD-10-CM | POA: Insufficient documentation

## 2022-10-18 DIAGNOSIS — M25512 Pain in left shoulder: Secondary | ICD-10-CM | POA: Insufficient documentation

## 2022-10-18 DIAGNOSIS — I10 Essential (primary) hypertension: Secondary | ICD-10-CM | POA: Diagnosis not present

## 2022-10-18 DIAGNOSIS — F17210 Nicotine dependence, cigarettes, uncomplicated: Secondary | ICD-10-CM | POA: Insufficient documentation

## 2022-10-18 DIAGNOSIS — Z2911 Encounter for prophylactic immunotherapy for respiratory syncytial virus (RSV): Secondary | ICD-10-CM

## 2022-10-18 DIAGNOSIS — Z79811 Long term (current) use of aromatase inhibitors: Secondary | ICD-10-CM | POA: Diagnosis not present

## 2022-10-18 DIAGNOSIS — C50412 Malignant neoplasm of upper-outer quadrant of left female breast: Secondary | ICD-10-CM

## 2022-10-18 DIAGNOSIS — F17200 Nicotine dependence, unspecified, uncomplicated: Secondary | ICD-10-CM

## 2022-10-18 DIAGNOSIS — M25511 Pain in right shoulder: Secondary | ICD-10-CM | POA: Diagnosis present

## 2022-10-18 DIAGNOSIS — Z170 Estrogen receptor positive status [ER+]: Secondary | ICD-10-CM

## 2022-10-18 MED ORDER — VARENICLINE TARTRATE (STARTER) 0.5 MG X 11 & 1 MG X 42 PO TBPK: ORAL_TABLET | ORAL | 0 refills | Status: DC

## 2022-10-18 MED ORDER — VARENICLINE TARTRATE 1 MG PO TABS: 1.0000 mg | ORAL_TABLET | Freq: Two times a day (BID) | ORAL | 1 refills | Status: DC

## 2022-10-18 MED ORDER — RSVPREF3 VAC RECOMB ADJUVANTED 120 MCG/0.5ML IM SUSR: 0.5000 mL | Freq: Once | INTRAMUSCULAR | 0 refills | Status: AC

## 2022-10-18 NOTE — Progress Notes (Signed)
Patient ID: Annette Richard, female    DOB: September 04, 1956  MRN: [id number]  CC: Pain (Pain on L & R shoulder, pain on L hand, Finger on L hand stiff / clicking X2 mo /Eyesight getting worse/Discuss flu vax.)   Subjective: Annette Richard is a 67 y.o. female who presents for UC visit Her concerns today include:  Patient with history of HTN, tob dep, anemia secondary to trauma, Vit D def.  LT breast CA ERP/PRP/Her2 - (lumpectomy 11/2021.  Plan for XRT and antiestrogen therapy), liver laceration/scalp laceration/left hip anterior dislocation with femoral neck and intertrochanteric fracture fall as a result of trauma 06/20/2021   HM:  had COVID booster [facility].  Needs flu.  Had both Shingrix [facility] [address]. MMG scheduled for later this mth.  Had bone density study done 05/2022, this revealed osteopenia in the hips.  She is on vitamin D 2000 IU daily and calcium supplement daily.  C/o "traveling pain" in body Was having some pain in legs for about 2 days; got better.  Now in shoulders -worse if she uses arms too much. 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   Some snapping in LT middle finger PIP jts.   Attributes it to Arimidex. Takes Ibuprofen for the muscle pain and Icy Hot rub as needed  Reports vision changes.  Has cataract in LT eye. Has problems reading things on her TV that is in front of her bed.  Wears glasses just for distance.  Last eye exam was a few yrs ago. She plans to schedule appt with her eye doctor  Aortic atherosclerosis/HL: Taking and tolerating atorvastatin 10 mg daily.  HTN:  taking Norvasc 5 mg daily.  Limits salt in foods  Tob:  down to 5 cigarettes a day.  Has tried the nicotine patches and has not found them helpful.  She also has tried an online course to help with smoking cessation and that has not been helpful.. Patient Active Problem List   Diagnosis Date Noted   Genetic testing 11/13/2021   Family history of pancreatic cancer 10/18/2021    Family history of breast cancer 10/18/2021   Malignant neoplasm of upper-outer quadrant of left breast in female, estrogen receptor positive (Annette Richard) 10/16/2021   Mass of upper inner quadrant of left breast 09/14/2021   Tobacco dependence 09/14/2021   Normocytic anemia 09/14/2021   Vitamin D deficiency 09/14/2021   Aortic atherosclerosis ([place]) 09/14/2021   Anterior dislocation of left hip ([place]) 07/12/2021   Closed displaced fracture of greater trochanter of left femur ([place]) 07/12/2021   S/p left hip fracture 07/10/2021   Plantar fasciitis 02/19/2018   TIA (transient ischemic attack) 06/26/2016   Mixed hyperlipidemia 03/25/2015   Essential hypertension 03/24/2015   GERD (gastroesophageal reflux disease) 05/12/2011   Environmental allergies 05/12/2011     Current Outpatient Medications on File Prior to Visit  Medication Sig Dispense Refill   acetaminophen (TYLENOL) 500 MG tablet Take 1,000 mg by mouth every 6 (six) hours as needed for mild pain.     amLODipine (NORVASC) 5 MG tablet TAKE 1 TABLET (5 MG TOTAL) BY MOUTH DAILY. 90 tablet 0   anastrozole (ARIMIDEX) 1 MG tablet TAKE 1 TABLET BY MOUTH EVERY DAY 30 tablet 2   aspirin 81 MG EC tablet Take 1 tablet (81 mg total) by mouth daily. Swallow whole. 30 tablet 11   atorvastatin (LIPITOR) 10 MG tablet TAKE 1 TABLET BY MOUTH EVERY DAY 90 tablet 0   calcium  carbonate (OSCAL) 1500 (600 Ca) MG TABS tablet Take 1,500 mg by mouth daily.     cholecalciferol (VITAMIN D3) 25 MCG (1000 UNIT) tablet Take 2,000 Units by mouth daily.     docusate sodium (COLACE) 100 MG capsule Take 1 capsule (100 mg total) by mouth 2 (two) times daily as needed for mild constipation. 10 capsule 0   ibuprofen (ADVIL) 800 MG tablet Take 1 tablet (800 mg total) by mouth every 8 (eight) hours as needed. 30 tablet 0   Multiple Vitamins-Iron (MULTIVITAMINS WITH IRON) TABS tablet Take 1 tablet by mouth daily.  0   nicotine (NICODERM CQ - DOSED IN MG/24 HOURS) 21 mg/24hr patch  PLACE 1 PATCH ONTO THE SKIN DAILY. (Patient not taking: Reported on 10/18/2022) 28 patch 1   Current Facility-Administered Medications on File Prior to Visit  Medication Dose Route Frequency Provider Last Rate Last Admin   triamcinolone acetonide (KENALOG) 10 MG/ML injection 10 mg  10 mg Other Once Trula Slade, DPM        No Known Allergies  Social History   Socioeconomic History   Marital status: Widowed    Spouse name: Not on file   Number of children: 2   Years of education: Not on file   Highest education level: Not on file  Occupational History   Occupation: Retired LPN  Tobacco Use   Smoking status: Every Day    Packs/day: 1.00    Years: 15.00    Total pack years: 15.00    Types: Cigarettes   Smokeless tobacco: Never  Vaping Use   Vaping Use: Never used  Substance and Sexual Activity   Alcohol use: Not Currently   Drug use: Never   Sexual activity: Never    Birth control/protection: Abstinence  Other Topics Concern   Not on file  Social History Narrative   ** Merged History Encounter **       Social Determinants of Health   Financial Resource Strain: High Risk (10/18/2021)   Overall Financial Resource Strain (CARDIA)    Difficulty of Paying Living Expenses: Hard  Food Insecurity: Food Insecurity Present (10/18/2021)   Hunger Vital Sign    Worried About Running Out of Food in the Last Year: Often true    Ran Out of Food in the Last Year: Sometimes true  Transportation Needs: Unmet Transportation Needs (01/02/2022)   PRAPARE - Hydrologist (Medical): Yes    Lack of Transportation (Non-Medical): No  Physical Activity: Not on file  Stress: Not on file  Social Connections: Not on file  Intimate Partner Violence: Not on file    Family History  Problem Relation Age of Onset   Pancreatic cancer Mother 61   Mesothelioma Father 62   Breast cancer Sister 7       reports negative genetic testing   Colon polyps Sister         precancerous   Breast cancer Sister        dx. 25s   Pulmonary fibrosis Brother    CVA Maternal Grandfather    Breast cancer Maternal Great-grandmother     Past Surgical History:  Procedure Laterality Date   ABDOMINAL HYSTERECTOMY     APPENDECTOMY     appynedectomy     BREAST LUMPECTOMY WITH RADIOACTIVE SEED AND SENTINEL LYMPH NODE BIOPSY Left 11/16/2021   Procedure: LEFT BREAST LUMPECTOMY WITH RADIOACTIVE SEED AND SENTINEL LYMPH NODE BIOPSY;  Surgeon: Erroll Luna, MD;  Location: [place];  Service: General;  Laterality: Left;   CHOLECYSTECTOMY     FACIAL LACERATION REPAIR N/A 07/10/2021   Procedure: FACIAL LACERATION REPAIR;  Surgeon: Jesusita Oka, MD;  Location: [place];  Service: General;  Laterality: N/A;   HIP CLOSED REDUCTION Left 07/10/2021   Procedure: ATTEMPTED CLOSED REDUCTION HIP, OPEN REDUCTION LEFT HIP;  Surgeon: Shona Needles, MD;  Location: [place];  Service: Orthopedics;  Laterality: Left;   KNEE ARTHROSCOPY WITH MENISCAL REPAIR Right    KNEE SURGERY      ROS: Review of Systems Negative except as stated above  PHYSICAL EXAM: BP 123/81 (BP Location: Right Arm, Patient Position: Sitting, Cuff Size: Normal)   Pulse 84   Temp 98.2 F (36.8 C) (Oral)   Ht '5\' 6"'$  (1.676 m)   Wt 173 lb (78.5 kg)   SpO2 97%   BMI 27.92 kg/m   Physical Exam  General appearance - alert, well appearing, and in no distress Mental status - normal mood, behavior, speech, dress, motor activity, and thought processes Neck - supple, no significant adenopathy Chest - clear to auscultation, no wheezes, rales or rhonchi, symmetric air entry Heart - normal rate, regular rhythm, normal S1, S2, no murmurs, rubs, clicks or gallops Musculoskeletal -fingers without significant arthritis changes.  She has mild popping of the PIP joint of the left middle finger with passive range of motion. Extremities - peripheral pulses normal, no pedal edema, no clubbing or cyanosis       Latest Ref Rng & Units 05/31/2022   10:39 AM 10/18/2021    8:16 AM 09/14/2021   10:42 AM  CMP  Glucose 70 - 99 mg/dL 95  95    BUN 8 - 23 mg/dL 10  5    Creatinine 0.44 - 1.00 mg/dL 0.86  0.89    Sodium 135 - 145 mmol/L 141  139    Potassium 3.5 - 5.1 mmol/L 4.3  3.7    Chloride 98 - 111 mmol/L 106  104    CO2 22 - 32 mmol/L 30  27    Calcium 8.9 - 10.3 mg/dL 10.3  9.7    Total Protein 6.5 - 8.1 g/dL 7.5  7.8  7.2   Total Bilirubin 0.3 - 1.2 mg/dL 0.2  0.5  0.3   Alkaline Phos 38 - 126 U/L 101  118  159   AST 15 - 41 U/L '15  14  17   '$ ALT 0 - 44 U/L '11  12  7    '$ Lipid Panel     Component Value Date/Time   CHOL 254 (H) 09/14/2021 1042   TRIG 176 (H) 09/14/2021 1042   HDL 48 09/14/2021 1042   CHOLHDL 5.3 (H) 09/14/2021 1042   CHOLHDL 4.9 06/27/2016 0522   VLDL 23 06/27/2016 0522   LDLCALC 173 (H) 09/14/2021 1042    CBC    Component Value Date/Time   WBC 7.6 05/31/2022 1039   WBC 7.5 07/14/2021 0313   RBC 4.73 05/31/2022 1039   HGB 14.0 05/31/2022 1039   HGB 13.9 09/14/2021 1042   HCT 41.5 05/31/2022 1039   HCT 42.9 09/14/2021 1042   PLT 244 05/31/2022 1039   PLT 354 09/14/2021 1042   MCV 87.7 05/31/2022 1039   MCV 88 09/14/2021 1042   MCV 87 02/14/2014 0359   MCH 29.6 05/31/2022 1039   MCHC 33.7 05/31/2022 1039   RDW 12.9 05/31/2022 1039   RDW 13.3 09/14/2021 1042   RDW 13.5 02/14/2014 0359  LYMPHSABS 1.9 05/31/2022 1039   LYMPHSABS 1.3 02/14/2014 0359   MONOABS 0.4 05/31/2022 1039   MONOABS 0.5 02/14/2014 0359   EOSABS 0.4 05/31/2022 1039   EOSABS 0.1 02/14/2014 0359   BASOSABS 0.1 05/31/2022 1039   BASOSABS 0.0 02/14/2014 0359    ASSESSMENT AND PLAN:  1. Essential hypertension Close to goal.  Continue Norvasc 5 mg daily.  2. Polyarthralgia Advised that Arimidex can cause aching in the joints and muscles.  Patient states this is not all the time and is bearable.  She will continue to use ibuprofen and IcyHot rub as needed.  3. Tobacco  dependence Patient is wanting to quit.  She has not found the nicotine patches helpful.  She had tried Wellbutrin in the past but it caused hallucination.  She is willing to try the Chantix.  I went over with her how the Chantix worked and possible side effects including bad dreams.  Prescription written for starter pack and continuation pack.  Advised to begin with the starter pack. - varenicline (CHANTIX CONTINUING MONTH PAK) 1 MG tablet; Take 1 tablet (1 mg total) by mouth 2 (two) times daily.  Dispense: 60 tablet; Refill: 1 - Varenicline Tartrate, Starter, (CHANTIX STARTING MONTH PAK) 0.5 MG X 11 & 1 MG X 42 TBPK; Take as directed PO  Dispense: 53 each; Refill: 0  4. Mixed hyperlipidemia Continue atorvastatin  5. Aortic atherosclerosis (HCC) Continue atorvastatin  6. Malignant neoplasm of upper-outer quadrant of left breast in female, estrogen receptor positive ([place]) She has completed active treatment with lumpectomy and XRT.  Now on maintenance Arimidex  7. Osteopenia of both hips Continue vitamin D and calcium supplementation.  Continue trying to engage in weightbearing exercises.  8. Vision changes Patient states she will call and schedule an appointment with her eye doctor herself.  9. Need for influenza vaccination  - Flu Vaccine QUAD High Dose(Fluad)  10. Need for RSV vaccination Prescription given for her to take to her outside pharmacy to get the RSV vaccine. - RSV vaccine recomb adjuvanted (AREXVY) 120 MCG/0.5ML injection; Inject 0.5 mLs into the muscle once for 1 dose.  Dispense: 0.5 mL; Refill: 0    Patient was given the opportunity to ask questions.  Patient verbalized understanding of the plan and was able to repeat key elements of the plan.   This documentation was completed using Radio producer.  Any transcriptional errors are unintentional.  Orders Placed This Encounter  Procedures   Flu Vaccine QUAD High Dose(Fluad)     Requested  Prescriptions   Signed Prescriptions Disp Refills   RSV vaccine recomb adjuvanted (AREXVY) 120 MCG/0.5ML injection 0.5 mL 0    Sig: Inject 0.5 mLs into the muscle once for 1 dose.   varenicline (CHANTIX CONTINUING MONTH PAK) 1 MG tablet 60 tablet 1    Sig: Take 1 tablet (1 mg total) by mouth 2 (two) times daily.   Varenicline Tartrate, Starter, (CHANTIX STARTING MONTH PAK) 0.5 MG X 11 & 1 MG X 42 TBPK 53 each 0    Sig: Take as directed PO    Return in about 6 weeks (around 11/29/2022) for Follow with me in 1 mth for Welcome to [facility] Wellness Visit.Karle Plumber, MD, FACP

## 2022-10-29 ENCOUNTER — Telehealth: Payer: Self-pay | Admitting: Hematology and Oncology

## 2022-10-29 NOTE — Telephone Encounter (Signed)
Patient aware of appointment changes

## 2022-11-07 ENCOUNTER — Encounter: Payer: Self-pay | Admitting: Hematology and Oncology

## 2022-11-23 ENCOUNTER — Ambulatory Visit: Payer: Medicare Other | Attending: Internal Medicine | Admitting: Internal Medicine

## 2022-11-23 ENCOUNTER — Encounter: Payer: Self-pay | Admitting: Internal Medicine

## 2022-11-23 VITALS — BP 130/84 | HR 77 | Ht 66.0 in | Wt 174.0 lb

## 2022-11-23 DIAGNOSIS — F17210 Nicotine dependence, cigarettes, uncomplicated: Secondary | ICD-10-CM

## 2022-11-23 DIAGNOSIS — F17200 Nicotine dependence, unspecified, uncomplicated: Secondary | ICD-10-CM

## 2022-11-23 DIAGNOSIS — Z7189 Other specified counseling: Secondary | ICD-10-CM

## 2022-11-23 DIAGNOSIS — Z0000 Encounter for general adult medical examination without abnormal findings: Secondary | ICD-10-CM

## 2022-11-23 DIAGNOSIS — Z23 Encounter for immunization: Secondary | ICD-10-CM | POA: Diagnosis not present

## 2022-11-23 DIAGNOSIS — Z2911 Encounter for prophylactic immunotherapy for respiratory syncytial virus (RSV): Secondary | ICD-10-CM | POA: Diagnosis not present

## 2022-11-23 NOTE — Patient Instructions (Addendum)
If you execute a living will or healthcare power of attorney, please bring a copy for our records. Do not forget to have your RSV and COVID 19 booster vaccines at any outside pharmacy.

## 2022-11-23 NOTE — Progress Notes (Signed)
Subjective:    Annette Richard is a 67 y.o. female who presents for a Welcome to Medicare exam.  Patient with history of HTN, tob dep, anemia secondary to trauma, Vit D def.  LT breast CA ERP/PRP/Her2 - (lumpectomy 11/2021.  Plan for XRT and antiestrogen therapy), liver laceration/scalp laceration/left hip anterior dislocation with femoral neck and intertrochanteric fracture fall as a result of trauma 06/20/2021    Review of Systems         Objective:    Today's Vitals   11/23/22 1002 11/23/22 1048  BP: 136/82 130/84  Pulse: 77   SpO2: 95%   Weight: 174 lb (78.9 kg)   Height: '5\' 6"'$  (1.676 m)   Body mass index is 28.08 kg/m. General: Older Caucasian female in NAD Chest clear to auscultation bilaterally CVS regular rate and rhythm Medications Outpatient Encounter Medications as of 11/23/2022  Medication Sig   acetaminophen (TYLENOL) 500 MG tablet Take 1,000 mg by mouth every 6 (six) hours as needed for mild pain.   amLODipine (NORVASC) 5 MG tablet TAKE 1 TABLET (5 MG TOTAL) BY MOUTH DAILY.   anastrozole (ARIMIDEX) 1 MG tablet TAKE 1 TABLET BY MOUTH EVERY DAY   aspirin 81 MG EC tablet Take 1 tablet (81 mg total) by mouth daily. Swallow whole.   atorvastatin (LIPITOR) 10 MG tablet TAKE 1 TABLET BY MOUTH EVERY DAY   calcium carbonate (OSCAL) 1500 (600 Ca) MG TABS tablet Take 1,500 mg by mouth daily.   cholecalciferol (VITAMIN D3) 25 MCG (1000 UNIT) tablet Take 2,000 Units by mouth daily.   docusate sodium (COLACE) 100 MG capsule Take 1 capsule (100 mg total) by mouth 2 (two) times daily as needed for mild constipation.   ibuprofen (ADVIL) 800 MG tablet Take 1 tablet (800 mg total) by mouth every 8 (eight) hours as needed.   Multiple Vitamins-Iron (MULTIVITAMINS WITH IRON) TABS tablet Take 1 tablet by mouth daily.   varenicline (CHANTIX CONTINUING MONTH PAK) 1 MG tablet Take 1 tablet (1 mg total) by mouth 2 (two) times daily.   Varenicline Tartrate, Starter, (CHANTIX STARTING MONTH  PAK) 0.5 MG X 11 & 1 MG X 42 TBPK Take as directed PO   [DISCONTINUED] nicotine (NICODERM CQ - DOSED IN MG/24 HOURS) 21 mg/24hr patch PLACE 1 PATCH ONTO THE SKIN DAILY. (Patient not taking: Reported on 10/18/2022)   Facility-Administered Encounter Medications as of 11/23/2022  Medication   triamcinolone acetonide (KENALOG) 10 MG/ML injection 10 mg     History: Past Medical History:  Diagnosis Date   Arthritis    Breast cancer ([place])    Osteopenia    Past Surgical History:  Procedure Laterality Date   ABDOMINAL HYSTERECTOMY     APPENDECTOMY     appynedectomy     BREAST LUMPECTOMY WITH RADIOACTIVE SEED AND SENTINEL LYMPH NODE BIOPSY Left 11/16/2021   Procedure: LEFT BREAST LUMPECTOMY WITH RADIOACTIVE SEED AND SENTINEL LYMPH NODE BIOPSY;  Surgeon: Erroll Luna, MD;  Location: [facility];  Service: General;  Laterality: Left;   CHOLECYSTECTOMY     FACIAL LACERATION REPAIR N/A 07/10/2021   Procedure: FACIAL LACERATION REPAIR;  Surgeon: Jesusita Oka, MD;  Location: [facility];  Service: General;  Laterality: N/A;   HIP CLOSED REDUCTION Left 07/10/2021   Procedure: ATTEMPTED CLOSED REDUCTION HIP, OPEN REDUCTION LEFT HIP;  Surgeon: Shona Needles, MD;  Location: [facility];  Service: Orthopedics;  Laterality: Left;   KNEE ARTHROSCOPY WITH MENISCAL REPAIR Right    KNEE  SURGERY      Family History  Problem Relation Age of Onset   Pancreatic cancer Mother 41   Mesothelioma Father 56   Breast cancer Sister 20       reports negative genetic testing   Colon polyps Sister        precancerous   Breast cancer Sister        dx. 51s   Pulmonary fibrosis Brother    CVA Maternal Grandfather    Breast cancer Maternal Great-grandmother    Social History   Occupational History   Occupation: Retired Corporate treasurer  Tobacco Use   Smoking status: Every Day    Packs/day: 1.00    Years: 15.00    Total pack years: 15.00    Types: Cigarettes   Smokeless tobacco: Never  Vaping Use   Vaping Use:  Never used  Substance and Sexual Activity   Alcohol use: Not Currently   Drug use: Never   Sexual activity: Never    Birth control/protection: Abstinence    Tobacco Counseling Down to 2 cig/day.  Tolerating Chantix Does not drink   Immunizations and Health Maintenance Immunization History  Administered Date(s) Administered   Fluad Quad(high Dose 65+) 10/18/2022   Influenza, Seasonal, Injecte, Preservative Fre 06/01/2015   Influenza,inj,Quad PF,6+ Mos 06/01/2015, 07/04/2016, 09/14/2021   Moderna Sars-Covid-2 Vaccination 11/27/2019, 12/30/2019   PFIZER(Purple Top)SARS-COV-2 Vaccination 08/21/2020   PPD Test 09/08/2016, 10/17/2019   Pfizer Covid-19 Vaccine Bivalent Booster 42yr & up 10/27/2021   Pneumococcal Conjugate (Pcv15) 09/14/2021   Pneumococcal Polysaccharide-23 06/01/2015   Tdap 06/17/2013, 07/10/2021   Zoster Recombinat (Shingrix) 03/05/2022, 06/14/2022   Health Maintenance Due  Topic Date Due   COVID-19 Vaccine (5 - 2023-24 season) 05/18/2022    Activities of Daily Living    11/23/2022    9:53 AM  In your present state of health, do you have any difficulty performing the following activities:  Hearing? 1  Vision? 1  Difficulty concentrating or making decisions? 0  Walking or climbing stairs? 1  Dressing or bathing? 1  Doing errands, shopping? 1  Preparing Food and eating ? N  Using the Toilet? N  In the past six months, have you accidently leaked urine? N  Do you have problems with loss of bowel control? N  Managing your Medications? N  Managing your Finances? N  Housekeeping or managing your Housekeeping? N    Advanced Directives: Does Patient Have a Medical Advance Directive?: No Would patient like information on creating a medical advance directive?: Yes (MAU/Ambulatory/Procedural Areas - Information given)    Assessment:    This is a routine wellness examination for this patient .   Vision/Hearing screen Snellen Charts: 20/200 BL with glasses.   Has appt in 2 wks for eye exam.   Whisper test normal  Dietary issues and exercise activities discussed:   "I do the best I can."  Eats can veggies and frozen fruits because that is what she can afford. Increasing her activity level.  She walks daily barring weather changes.    Goals      Weight (lb) < 200 lb (90.7 kg)     Wants to get down to 135 lbs before end of year.      Depression Screen    11/23/2022    9:49 AM 10/18/2022   11:14 AM 01/11/2022    1:47 PM 09/14/2021    9:37 AM  PHQ 2/9 Scores  PHQ - 2 Score 0 0 0 0  PHQ- 9 Score 3  2 5    Depression not a major issue for her. No SI.   Fall Risk    01/11/2022    1:47 PM  Fall Risk   Falls in the past year? 0  Number falls in past yr: 0  Injury with Fall? 0  Risk for fall due to : No Fall Risks    Cognitive Function:    11/23/2022   10:10 AM  MMSE - Mini Mental State Exam  Orientation to time 5  Orientation to Place 5  Registration 3  Attention/ Calculation 5  Recall 3  Language- name 2 objects 2  Language- repeat 1  Language- follow 3 step command 3  Language- read & follow direction 1  Write a sentence 1  Copy design 1  Total score 30        Patient Care Team: Ladell Pier, MD as PCP - General (Internal Medicine) Erroll Luna, MD as Consulting Physician (General Surgery) Benay Pike, MD as Consulting Physician (Hematology and Oncology) Kyung Rudd, MD as Consulting Physician (Radiation Oncology)     Plan:    1. Encounter for Medicare annual wellness exam   2. Advance directive discussed with patient Discussed advanced directive with her including healthcare power of attorney and living will.  Patient given information packet with forms in it.  Advised that if she execute a living will or healthcare power of attorney, she should bring a copy for our records.  3. Tobacco dependence Commended her on her efforts to quit.  She will continue the Chantix  4. Need for RSV  vaccination Encourage patient to get the RSV vaccine at any outside pharmacy.  5. Need for second booster dose of COVID-19 vaccine Encouraged her to get an updated COVID booster.  I have personally reviewed and noted the following in the patient's chart:   Medical and social history Use of alcohol, tobacco or illicit drugs  Current medications and supplements Functional ability and status Nutritional status Physical activity Advanced directives List of other physicians Hospitalizations, surgeries, and ER visits in previous 12 months Vitals Screenings to include cognitive, depression, and falls Referrals and appointments  In addition, I have reviewed and discussed with patient certain preventive protocols, quality metrics, and best practice recommendations. A written personalized care plan for preventive services as well as general preventive health recommendations were provided to patient.     Karle Plumber, MD 11/23/2022

## 2022-11-26 ENCOUNTER — Ambulatory Visit: Payer: Medicaid Other | Admitting: Internal Medicine

## 2022-11-27 ENCOUNTER — Ambulatory Visit: Payer: 59 | Admitting: Internal Medicine

## 2022-11-27 ENCOUNTER — Other Ambulatory Visit: Payer: Self-pay | Admitting: *Deleted

## 2022-11-29 ENCOUNTER — Inpatient Hospital Stay: Payer: Medicare Other | Admitting: Hematology and Oncology

## 2022-11-29 ENCOUNTER — Other Ambulatory Visit: Payer: 59

## 2022-11-29 ENCOUNTER — Inpatient Hospital Stay: Payer: Medicare Other | Attending: Hematology and Oncology

## 2022-11-29 ENCOUNTER — Ambulatory Visit: Payer: 59 | Admitting: Hematology and Oncology

## 2022-11-29 ENCOUNTER — Other Ambulatory Visit: Payer: Self-pay | Admitting: *Deleted

## 2022-11-29 ENCOUNTER — Other Ambulatory Visit: Payer: Self-pay

## 2022-11-29 VITALS — BP 142/88 | HR 84 | Temp 97.9°F | Resp 18 | Ht 66.0 in | Wt 172.9 lb

## 2022-11-29 DIAGNOSIS — M8580 Other specified disorders of bone density and structure, unspecified site: Secondary | ICD-10-CM | POA: Diagnosis not present

## 2022-11-29 DIAGNOSIS — Z79811 Long term (current) use of aromatase inhibitors: Secondary | ICD-10-CM | POA: Insufficient documentation

## 2022-11-29 DIAGNOSIS — F17210 Nicotine dependence, cigarettes, uncomplicated: Secondary | ICD-10-CM | POA: Insufficient documentation

## 2022-11-29 DIAGNOSIS — Z923 Personal history of irradiation: Secondary | ICD-10-CM | POA: Diagnosis not present

## 2022-11-29 DIAGNOSIS — Z79899 Other long term (current) drug therapy: Secondary | ICD-10-CM | POA: Insufficient documentation

## 2022-11-29 DIAGNOSIS — Z170 Estrogen receptor positive status [ER+]: Secondary | ICD-10-CM | POA: Diagnosis not present

## 2022-11-29 DIAGNOSIS — C50412 Malignant neoplasm of upper-outer quadrant of left female breast: Secondary | ICD-10-CM | POA: Diagnosis present

## 2022-11-29 LAB — CMP (CANCER CENTER ONLY)
ALT: 11 U/L (ref 0–44)
AST: 16 U/L (ref 15–41)
Albumin: 4.4 g/dL (ref 3.5–5.0)
Alkaline Phosphatase: 102 U/L (ref 38–126)
Anion gap: 7 (ref 5–15)
BUN: 10 mg/dL (ref 8–23)
CO2: 27 mmol/L (ref 22–32)
Calcium: 9.8 mg/dL (ref 8.9–10.3)
Chloride: 107 mmol/L (ref 98–111)
Creatinine: 0.87 mg/dL (ref 0.44–1.00)
GFR, Estimated: >60 mL/min (ref >60)
Glucose, Bld: 104 mg/dL — ABNORMAL HIGH (ref 70–99)
Potassium: 3.7 mmol/L (ref 3.5–5.1)
Sodium: 141 mmol/L (ref 135–145)
Total Bilirubin: 0.4 mg/dL (ref 0.3–1.2)
Total Protein: 7.7 g/dL (ref 6.5–8.1)

## 2022-11-29 LAB — CBC WITH DIFFERENTIAL (CANCER CENTER ONLY)
Abs Immature Granulocytes: 0.03 10*3/uL (ref 0.00–0.07)
Basophils Absolute: 0.1 10*3/uL (ref 0.0–0.1)
Basophils Relative: 1 %
Eosinophils Absolute: 0.2 10*3/uL (ref 0.0–0.5)
Eosinophils Relative: 2 %
HCT: 42.4 % (ref 36.0–46.0)
Hemoglobin: 14.6 g/dL (ref 12.0–15.0)
Immature Granulocytes: 0 %
Lymphocytes Relative: 21 %
Lymphs Abs: 2 10*3/uL (ref 0.7–4.0)
MCH: 29.6 pg (ref 26.0–34.0)
MCHC: 34.4 g/dL (ref 30.0–36.0)
MCV: 85.8 fL (ref 80.0–100.0)
Monocytes Absolute: 0.6 10*3/uL (ref 0.1–1.0)
Monocytes Relative: 6 %
Neutro Abs: 6.4 10*3/uL (ref 1.7–7.7)
Neutrophils Relative %: 70 %
Platelet Count: 268 10*3/uL (ref 150–400)
RBC: 4.94 MIL/uL (ref 3.87–5.11)
RDW: 12.8 % (ref 11.5–15.5)
WBC Count: 9.2 10*3/uL (ref 4.0–10.5)
nRBC: 0 % (ref 0.0–0.2)

## 2022-11-29 NOTE — Progress Notes (Signed)
[place] NOTE  Patient Care Team: Ladell Pier, MD as PCP - General (Internal Medicine) Erroll Luna, MD as Consulting Physician (General Surgery) Benay Pike, MD as Consulting Physician (Hematology and Oncology) Kyung Rudd, MD as Consulting Physician (Radiation Oncology)  CHIEF COMPLAINTS/PURPOSE OF CONSULTATION:  Newly diagnosed breast cancer  HISTORY OF PRESENTING ILLNESS:  Demaris Callander 67 y.o. female is here because of recent diagnosis of left sided breast cancer.   SUMMARY OF ONCOLOGIC HISTORY: Oncology History  Malignant neoplasm of upper-outer quadrant of left breast in female, estrogen receptor positive ([place])  10/16/2021 Initial Diagnosis   Malignant neoplasm of upper-outer quadrant of left breast in female, estrogen receptor positive ([place])   10/18/2021 Cancer Staging   Staging form: Breast, AJCC 8th Edition - Clinical: Stage IB (cT2, cN0, cM0, G2, ER+, PR+, HER2-) - Signed by Hayden Pedro, PA-C on 10/18/2021 Method of lymph node assessment: Clinical Histologic grading system: 3 grade system    Genetic Testing   [facility] CancerNext-Expanded Panel was Negative. Report date is 11/13/2021.  The CancerNext-Expanded gene panel offered by [facility] and includes sequencing, rearrangement, and RNA analysis for the following 77 genes: AIP, ALK, APC, ATM, AXIN2, BAP1, BARD1, BLM, BMPR1A, BRCA1, BRCA2, BRIP1, CDC73, CDH1, CDK4, CDKN1B, CDKN2A, CHEK2, CTNNA1, DICER1, FANCC, FH, FLCN, GALNT12, KIF1B, LZTR1, MAX, MEN1, MET, MLH1, MSH2, MSH3, MSH6, MUTYH, NBN, NF1, NF2, NTHL1, PALB2, PHOX2B, PMS2, POT1, PRKAR1A, PTCH1, PTEN, RAD51C, RAD51D, RB1, RECQL, RET, SDHA, SDHAF2, SDHB, SDHC, SDHD, SMAD4, SMARCA4, SMARCB1, SMARCE1, STK11, SUFU, TMEM127, TP53, TSC1, TSC2, VHL and XRCC2 (sequencing and deletion/duplication); EGFR, EGLN1, HOXB13, KIT, MITF, PDGFRA, POLD1, and POLE (sequencing only); EPCAM and GREM1 (deletion/duplication only).    11/16/2021  Surgery   She underwent left breast seed localized lumpectomy with left axillary sentinel lymph node mapping. Surgical pathology showed invasive ductal carcinoma, grade 2, 3.6 cm in maximal extent, tumor approaches to less than 1 mm from closest resection margin, DCIS, intermediate grade, no metastatic carcinoma in the any of the lymph nodes.  Prior prognostic showed ER 100% positive, strong staining, PR 100% positive, strong staining, HER2 negative and Ki-67 of 5%   12/07/2021 Oncotype testing   Oncotype of 4, group salute chemotherapy benefit less than 1%.  Distant recurrence risk at 9 years with AI or Tam is 3%.   01/09/2022 - 02/07/2022 Radiation Therapy   Site Technique Total Dose (Gy) Dose per Fx (Gy) Completed Fx Beam Energies  Breast, Left: Breast_L 3D 42.56/42.56 2.66 16/16 10X, 6XFFF  Breast, Left: Breast_L_Bst 3D 10/10 2.5 4/4 10X     01/2022 -  Anti-estrogen oral therapy   Anastrozole    Interval history  Ms. Gregary Signs is here for follow-up.   She is now on anastrozole. She has noted some intermittent nausea, arthralgias from anastrozole. She is able to tolerate it well overall. Mammogram done on November 05, 2022 with no evidence of malignancy. Rest of the pertinent 10 point ROS reviewed and negative  MEDICAL HISTORY:  Past Medical History:  Diagnosis Date   Arthritis    Breast cancer ([place])    Osteopenia     SURGICAL HISTORY: Past Surgical History:  Procedure Laterality Date   ABDOMINAL HYSTERECTOMY     APPENDECTOMY     appynedectomy     BREAST LUMPECTOMY WITH RADIOACTIVE SEED AND SENTINEL LYMPH NODE BIOPSY Left 11/16/2021   Procedure: LEFT BREAST LUMPECTOMY WITH RADIOACTIVE SEED AND SENTINEL LYMPH NODE BIOPSY;  Surgeon: Erroll Luna, MD;  Location: [facility];  Service: General;  Laterality: Left;   CHOLECYSTECTOMY     FACIAL LACERATION REPAIR N/A 07/10/2021   Procedure: FACIAL LACERATION REPAIR;  Surgeon: Jesusita Oka, MD;  Location: [place];   Service: General;  Laterality: N/A;   HIP CLOSED REDUCTION Left 07/10/2021   Procedure: ATTEMPTED CLOSED REDUCTION HIP, OPEN REDUCTION LEFT HIP;  Surgeon: Shona Needles, MD;  Location: [place];  Service: Orthopedics;  Laterality: Left;   KNEE ARTHROSCOPY WITH MENISCAL REPAIR Right    KNEE SURGERY      SOCIAL HISTORY: Social History   Socioeconomic History   Marital status: Widowed    Spouse name: Not on file   Number of children: 2   Years of education: Not on file   Highest education level: Not on file  Occupational History   Occupation: Retired LPN  Tobacco Use   Smoking status: Every Day    Packs/day: 1.00    Years: 15.00    Additional pack years: 0.00    Total pack years: 15.00    Types: Cigarettes   Smokeless tobacco: Never  Vaping Use   Vaping Use: Never used  Substance and Sexual Activity   Alcohol use: Not Currently   Drug use: Never   Sexual activity: Never    Birth control/protection: Abstinence  Other Topics Concern   Not on file  Social History Narrative   ** Merged History Encounter **       Social Determinants of Health   Financial Resource Strain: High Risk (11/23/2022)   Overall Financial Resource Strain (CARDIA)    Difficulty of Paying Living Expenses: Very hard  Food Insecurity: Food Insecurity Present (11/23/2022)   Hunger Vital Sign    Worried About Running Out of Food in the Last Year: Often true    Ran Out of Food in the Last Year: Often true  Transportation Needs: Unmet Transportation Needs (01/02/2022)   PRAPARE - Hydrologist (Medical): Yes    Lack of Transportation (Non-Medical): No  Physical Activity: Sufficiently Active (11/23/2022)   Exercise Vital Sign    Days of Exercise per Week: 7 days    Minutes of Exercise per Session: 30 min  Stress: No Stress Concern Present (11/23/2022)   [place]    Feeling of Stress : Only a little  Social  Connections: Socially Isolated (11/23/2022)   Social Connection and Isolation Panel [NHANES]    Frequency of Communication with Friends and Family: Never    Frequency of Social Gatherings with Friends and Family: Never    Attends Religious Services: Never    Marine scientist or Organizations: No    Attends Archivist Meetings: Never    Marital Status: Widowed  Intimate Partner Violence: Not At Risk (11/23/2022)   Humiliation, Afraid, Rape, and Kick questionnaire    Fear of Current or Ex-Partner: No    Emotionally Abused: No    Physically Abused: No    Sexually Abused: No    FAMILY HISTORY: Family History  Problem Relation Age of Onset   Pancreatic cancer Mother 6   Mesothelioma Father 76   Breast cancer Sister 60       reports negative genetic testing   Colon polyps Sister        precancerous   Breast cancer Sister        dx. 30s   Pulmonary fibrosis Brother    CVA Maternal Grandfather  Breast cancer Maternal Great-grandmother     ALLERGIES:  has No Known Allergies.  MEDICATIONS:  Current Outpatient Medications  Medication Sig Dispense Refill   acetaminophen (TYLENOL) 500 MG tablet Take 1,000 mg by mouth every 6 (six) hours as needed for mild pain.     amLODipine (NORVASC) 5 MG tablet TAKE 1 TABLET (5 MG TOTAL) BY MOUTH DAILY. 90 tablet 0   anastrozole (ARIMIDEX) 1 MG tablet TAKE 1 TABLET BY MOUTH EVERY DAY 30 tablet 2   aspirin 81 MG EC tablet Take 1 tablet (81 mg total) by mouth daily. Swallow whole. 30 tablet 11   atorvastatin (LIPITOR) 10 MG tablet TAKE 1 TABLET BY MOUTH EVERY DAY 90 tablet 0   calcium carbonate (OSCAL) 1500 (600 Ca) MG TABS tablet Take 1,500 mg by mouth daily.     cholecalciferol (VITAMIN D3) 25 MCG (1000 UNIT) tablet Take 2,000 Units by mouth daily.     docusate sodium (COLACE) 100 MG capsule Take 1 capsule (100 mg total) by mouth 2 (two) times daily as needed for mild constipation. 10 capsule 0   ibuprofen (ADVIL) 800 MG tablet Take  1 tablet (800 mg total) by mouth every 8 (eight) hours as needed. 30 tablet 0   Multiple Vitamins-Iron (MULTIVITAMINS WITH IRON) TABS tablet Take 1 tablet by mouth daily.  0   varenicline (CHANTIX CONTINUING MONTH PAK) 1 MG tablet Take 1 tablet (1 mg total) by mouth 2 (two) times daily. 60 tablet 1   Varenicline Tartrate, Starter, (CHANTIX STARTING MONTH PAK) 0.5 MG X 11 & 1 MG X 42 TBPK Take as directed PO 53 each 0   Current Facility-Administered Medications  Medication Dose Route Frequency Provider Last Rate Last Admin   triamcinolone acetonide (KENALOG) 10 MG/ML injection 10 mg  10 mg Other Once Trula Slade, DPM        PHYSICAL EXAMINATION: ECOG PERFORMANCE STATUS: 0 - Asymptomatic  Vitals:   11/29/22 1327  BP: (!) 142/88  Pulse: 84  Resp: 18  Temp: 97.9 F (36.6 C)  SpO2: 100%     Filed Weights   11/29/22 1327  Weight: 172 lb 14.4 oz (78.4 kg)      GENERAL:alert, no distress and comfortable Bilateral breast with no palpable masses, post lumpectomy and radiation changes left breast. No regional adenopathy CTA bilaterally No LE Edema  LABORATORY DATA:  I have reviewed the data as listed Lab Results  Component Value Date   WBC 9.2 11/29/2022   HGB 14.6 11/29/2022   HCT 42.4 11/29/2022   MCV 85.8 11/29/2022   PLT 268 11/29/2022   Lab Results  Component Value Date   NA 141 11/29/2022   K 3.7 11/29/2022   CL 107 11/29/2022   CO2 27 11/29/2022    RADIOGRAPHIC STUDIES: I have personally reviewed the radiological reports and agreed with the findings in the report.  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  Given ER/PR strongly  positive and relatively small size tumor with no associated lymph nodes, we agreed to proceed with lumpectomy first followed by adjuvant radiation and Oncotype testing. Oncotype score of 4, no benefit of chemotherapy, distant risk of recurrence at 9 years with AI or Tam alone is 3%.  She is status post adjuvant radiation.  She is now on anastrozole, tolerating it well. She has noted some minor nausea and arthralgias PE with no concerns, post op and post radiation changes left breast, nipples are chronically inverted. Last mammogram Feb 2024, neg for malignancy Sep 2023, bone density showed osteopenia, recommend weight bearing exercise, vit D and calcium supplementation.  Return to clinic in 6 months, She can FU with [place] every September and every March with surgery team. Mammogram due Feb 2025.   Total time spent: 30 minutes All questions were answered. The patient knows to call the clinic with any problems, questions or concerns.    Benay Pike, MD 11/29/22

## 2022-11-29 NOTE — Assessment & Plan Note (Addendum)
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  Given ER/PR strongly positive and relatively small size tumor with no associated lymph nodes, we agreed to proceed with lumpectomy first followed by adjuvant radiation and Oncotype testing. Oncotype score of 4, no benefit of chemotherapy, distant risk of recurrence at 9 years with AI or Tam alone is 3%.    She is status post adjuvant radiation.  She is now on anastrozole, tolerating it well. She has noted some minor nausea and arthralgias PE with no concerns, post op and post radiation changes left breast, nipples are chronically inverted. Last mammogram Feb 2024, neg for malignancy Sep 2023, bone density showed osteopenia, recommend weight bearing exercise, vit D and calcium supplementation.  Return to clinic in 6 months, She can FU with [place] every September and every March with surgery team. Mammogram due Feb 2025.

## 2022-12-03 ENCOUNTER — Other Ambulatory Visit: Payer: Self-pay | Admitting: *Deleted

## 2022-12-03 MED ORDER — ANASTROZOLE 1 MG PO TABS: 1.0000 mg | ORAL_TABLET | Freq: Every day | ORAL | 2 refills | Status: DC

## 2022-12-04 ENCOUNTER — Other Ambulatory Visit: Payer: Self-pay | Admitting: Internal Medicine

## 2022-12-04 DIAGNOSIS — I700 Atherosclerosis of aorta: Secondary | ICD-10-CM

## 2022-12-04 DIAGNOSIS — I10 Essential (primary) hypertension: Secondary | ICD-10-CM

## 2022-12-05 ENCOUNTER — Other Ambulatory Visit: Payer: Self-pay | Admitting: Internal Medicine

## 2022-12-05 DIAGNOSIS — I700 Atherosclerosis of aorta: Secondary | ICD-10-CM

## 2022-12-05 DIAGNOSIS — I10 Essential (primary) hypertension: Secondary | ICD-10-CM

## 2022-12-05 MED ORDER — ATORVASTATIN CALCIUM 10 MG PO TABS: 10.0000 mg | ORAL_TABLET | Freq: Every day | ORAL | 1 refills | Status: DC

## 2022-12-05 MED ORDER — AMLODIPINE BESYLATE 5 MG PO TABS: 5.0000 mg | ORAL_TABLET | Freq: Every day | ORAL | 1 refills | Status: DC

## 2022-12-05 NOTE — Telephone Encounter (Signed)
Pt is calling to check on her medication refill request. Pt was told by the pharmacy that she need an appt with her PCP before they can refill her medication. Pt was seen by her PCP on 10/18/22 and 11/23/22.   Please advise.

## 2022-12-24 ENCOUNTER — Ambulatory Visit: Payer: Medicare Other

## 2022-12-31 ENCOUNTER — Ambulatory Visit: Payer: Medicare Other | Attending: Surgery | Admitting: Physical Therapy

## 2022-12-31 DIAGNOSIS — Z483 Aftercare following surgery for neoplasm: Secondary | ICD-10-CM | POA: Insufficient documentation
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
## 2023-02-19 ENCOUNTER — Other Ambulatory Visit: Payer: Self-pay | Admitting: Hematology and Oncology

## 2023-03-25 ENCOUNTER — Ambulatory Visit: Payer: 59 | Admitting: Internal Medicine

## 2023-04-08 ENCOUNTER — Ambulatory Visit: Payer: Medicare Other | Attending: Surgery | Admitting: Rehabilitation

## 2023-04-08 DIAGNOSIS — Z170 Estrogen receptor positive status [ER+]: Secondary | ICD-10-CM | POA: Insufficient documentation

## 2023-04-08 DIAGNOSIS — C50412 Malignant neoplasm of upper-outer quadrant of left female breast: Secondary | ICD-10-CM | POA: Insufficient documentation

## 2023-04-08 DIAGNOSIS — Z483 Aftercare following surgery for neoplasm: Secondary | ICD-10-CM | POA: Insufficient documentation
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
## 2023-04-15 ENCOUNTER — Encounter: Payer: Self-pay | Admitting: Hematology and Oncology

## 2023-04-16 ENCOUNTER — Telehealth: Payer: Self-pay | Admitting: *Deleted

## 2023-04-18 NOTE — Telephone Encounter (Signed)
Pt called to this RN to state area of concern on her surgical arm has greatly improved " just didn't even think of something as simple as a bug bite "  No further needs at this time.

## 2023-06-04 ENCOUNTER — Inpatient Hospital Stay: Payer: 59 | Admitting: Hematology and Oncology

## 2023-06-06 ENCOUNTER — Telehealth: Payer: Self-pay | Admitting: Hematology and Oncology

## 2023-06-06 NOTE — Telephone Encounter (Signed)
Patient called to reschedule missed appointment.  Voicemail full.  Unable to leave message.

## 2023-07-03 ENCOUNTER — Inpatient Hospital Stay: Payer: Medicare Other | Attending: Hematology and Oncology | Admitting: Hematology and Oncology

## 2023-07-03 VITALS — BP 142/88 | HR 88 | Temp 98.1°F | Resp 18 | Wt 173.5 lb

## 2023-07-03 DIAGNOSIS — Z79899 Other long term (current) drug therapy: Secondary | ICD-10-CM | POA: Diagnosis not present

## 2023-07-03 DIAGNOSIS — Z803 Family history of malignant neoplasm of breast: Secondary | ICD-10-CM | POA: Insufficient documentation

## 2023-07-03 DIAGNOSIS — C50412 Malignant neoplasm of upper-outer quadrant of left female breast: Secondary | ICD-10-CM | POA: Diagnosis present

## 2023-07-03 DIAGNOSIS — F17210 Nicotine dependence, cigarettes, uncomplicated: Secondary | ICD-10-CM | POA: Diagnosis not present

## 2023-07-03 DIAGNOSIS — Z83719 Family history of colon polyps, unspecified: Secondary | ICD-10-CM | POA: Insufficient documentation

## 2023-07-03 DIAGNOSIS — R5383 Other fatigue: Secondary | ICD-10-CM | POA: Insufficient documentation

## 2023-07-03 DIAGNOSIS — Z79811 Long term (current) use of aromatase inhibitors: Secondary | ICD-10-CM | POA: Diagnosis not present

## 2023-07-03 DIAGNOSIS — Z9181 History of falling: Secondary | ICD-10-CM | POA: Diagnosis not present

## 2023-07-03 DIAGNOSIS — Z170 Estrogen receptor positive status [ER+]: Secondary | ICD-10-CM | POA: Insufficient documentation

## 2023-07-03 DIAGNOSIS — Z923 Personal history of irradiation: Secondary | ICD-10-CM | POA: Diagnosis not present
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
## 2023-07-03 NOTE — Assessment & Plan Note (Addendum)
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  Given ER/PR strongly positive and relatively small size tumor with no associated lymph nodes, we agreed to proceed with lumpectomy first followed by adjuvant radiation and Oncotype testing. Oncotype score of 4, no benefit of chemotherapy, distant risk of recurrence at 9 years with AI or Tam alone is 3%.    Fatigue New onset, severe, and intermittent. Possible viral etiology given recent onset and exposure to school-aged grandchild. -Advise rest and hydration. -If symptoms persist for more than 2 weeks, patient to notify the office.  History of Breast Cancer On Anastrozole for over a year with improvement in joint pains and nausea. Last mammogram in February 2024 was normal. Physical exam today unremarkable. -Continue Anastrozole as prescribed. -Next mammogram ordered for 2025. -Follow-up with breast surgeon in February 2025. -Return to clinic in August/September 2025.  Bone Health Last bone density in September 2023. Patient is active, walking daily and performing exercises for mobility. Taking Vitamin D and Calcium. -Continue current activity level and supplements. Repeat bone density in Sep 2025.  Fall Risk Recent fall due to environmental factors, not instability. -No specific intervention needed at this time.

## 2023-07-15 ENCOUNTER — Ambulatory Visit: Payer: Medicare Other | Attending: Surgery

## 2023-07-15 VITALS — Wt 172.4 lb

## 2023-07-15 DIAGNOSIS — Z483 Aftercare following surgery for neoplasm: Secondary | ICD-10-CM | POA: Insufficient documentation
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
## 2023-07-16 ENCOUNTER — Ambulatory Visit (INDEPENDENT_AMBULATORY_CARE_PROVIDER_SITE_OTHER): Payer: Medicare Other

## 2023-07-16 ENCOUNTER — Ambulatory Visit (INDEPENDENT_AMBULATORY_CARE_PROVIDER_SITE_OTHER): Payer: Medicare Other | Admitting: Family Medicine

## 2023-07-16 VITALS — BP 162/84 | HR 87 | Ht 66.0 in | Wt 175.0 lb

## 2023-07-16 DIAGNOSIS — G8929 Other chronic pain: Secondary | ICD-10-CM | POA: Diagnosis not present

## 2023-07-16 DIAGNOSIS — M5442 Lumbago with sciatica, left side: Secondary | ICD-10-CM | POA: Diagnosis not present

## 2023-07-16 DIAGNOSIS — M25552 Pain in left hip: Secondary | ICD-10-CM | POA: Diagnosis not present

## 2023-07-16 MED ORDER — TIZANIDINE HCL 2 MG PO TABS: 2.0000 mg | ORAL_TABLET | Freq: Every evening | ORAL | 0 refills | Status: DC | PRN

## 2023-07-16 NOTE — Patient Instructions (Signed)
Thank you for coming in today.   Please get an Xray today before you leave   I've sent a prescription for Tizanidine to your pharmacy.   I've referred you to Physical Therapy.  Let us know if you don't hear from them in one week.  Check back in 1 month

## 2023-07-17 ENCOUNTER — Other Ambulatory Visit: Payer: Self-pay

## 2023-07-17 ENCOUNTER — Ambulatory Visit: Payer: Medicare Other | Attending: Family Medicine | Admitting: Physical Therapy

## 2023-07-17 ENCOUNTER — Encounter: Payer: Self-pay | Admitting: Physical Therapy

## 2023-07-17 DIAGNOSIS — M5442 Lumbago with sciatica, left side: Secondary | ICD-10-CM | POA: Diagnosis present

## 2023-07-17 DIAGNOSIS — R262 Difficulty in walking, not elsewhere classified: Secondary | ICD-10-CM

## 2023-07-17 DIAGNOSIS — M6281 Muscle weakness (generalized): Secondary | ICD-10-CM

## 2023-07-17 DIAGNOSIS — G8929 Other chronic pain: Secondary | ICD-10-CM

## 2023-07-17 DIAGNOSIS — M25552 Pain in left hip: Secondary | ICD-10-CM
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
## 2023-07-19 ENCOUNTER — Ambulatory Visit: Payer: Medicare Other | Admitting: Physical Therapy

## 2023-07-19 ENCOUNTER — Ambulatory Visit: Payer: Medicare Other | Attending: Family Medicine | Admitting: Physical Therapy

## 2023-07-19 DIAGNOSIS — M5442 Lumbago with sciatica, left side: Secondary | ICD-10-CM | POA: Diagnosis present

## 2023-07-19 DIAGNOSIS — M6281 Muscle weakness (generalized): Secondary | ICD-10-CM | POA: Diagnosis present

## 2023-07-19 DIAGNOSIS — G8929 Other chronic pain: Secondary | ICD-10-CM | POA: Insufficient documentation

## 2023-07-19 DIAGNOSIS — R262 Difficulty in walking, not elsewhere classified: Secondary | ICD-10-CM | POA: Insufficient documentation

## 2023-07-19 DIAGNOSIS — M25552 Pain in left hip: Secondary | ICD-10-CM | POA: Diagnosis present
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
## 2023-07-23 ENCOUNTER — Encounter: Payer: 59 | Admitting: Physical Therapy

## 2023-07-23 ENCOUNTER — Other Ambulatory Visit: Payer: Self-pay | Admitting: Family Medicine

## 2023-07-23 NOTE — Telephone Encounter (Signed)
OK for 90 day supply per Dr. Zollie Pee standing orders.

## 2023-07-25 ENCOUNTER — Ambulatory Visit: Payer: Medicare Other | Admitting: Physical Therapy

## 2023-07-25 ENCOUNTER — Encounter: Payer: Self-pay | Admitting: Physical Therapy

## 2023-07-25 DIAGNOSIS — G8929 Other chronic pain: Secondary | ICD-10-CM

## 2023-07-25 DIAGNOSIS — M25552 Pain in left hip: Secondary | ICD-10-CM | POA: Diagnosis not present

## 2023-07-25 DIAGNOSIS — M6281 Muscle weakness (generalized): Secondary | ICD-10-CM
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
## 2023-07-30 ENCOUNTER — Encounter: Payer: Self-pay | Admitting: Physical Therapy

## 2023-07-30 ENCOUNTER — Ambulatory Visit: Payer: Medicare Other | Admitting: Physical Therapy

## 2023-07-30 DIAGNOSIS — M6281 Muscle weakness (generalized): Secondary | ICD-10-CM

## 2023-07-30 DIAGNOSIS — G8929 Other chronic pain: Secondary | ICD-10-CM

## 2023-07-30 DIAGNOSIS — M25552 Pain in left hip: Secondary | ICD-10-CM
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
## 2023-08-02 ENCOUNTER — Ambulatory Visit: Payer: Medicare Other | Admitting: Physical Therapy

## 2023-08-02 DIAGNOSIS — M6281 Muscle weakness (generalized): Secondary | ICD-10-CM

## 2023-08-02 DIAGNOSIS — G8929 Other chronic pain: Secondary | ICD-10-CM

## 2023-08-02 DIAGNOSIS — M25552 Pain in left hip: Secondary | ICD-10-CM
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
## 2023-08-02 NOTE — Patient Instructions (Signed)

## 2023-08-07 ENCOUNTER — Ambulatory Visit: Payer: Medicare Other | Admitting: Physical Therapy

## 2023-08-07 DIAGNOSIS — G8929 Other chronic pain: Secondary | ICD-10-CM

## 2023-08-07 DIAGNOSIS — M6281 Muscle weakness (generalized): Secondary | ICD-10-CM

## 2023-08-07 DIAGNOSIS — M25552 Pain in left hip: Secondary | ICD-10-CM
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
## 2023-08-07 NOTE — Patient Instructions (Signed)

## 2023-08-08 ENCOUNTER — Ambulatory Visit: Payer: Medicare Other | Admitting: Physical Therapy

## 2023-08-08 DIAGNOSIS — G8929 Other chronic pain: Secondary | ICD-10-CM

## 2023-08-08 DIAGNOSIS — M6281 Muscle weakness (generalized): Secondary | ICD-10-CM

## 2023-08-08 DIAGNOSIS — M25552 Pain in left hip: Secondary | ICD-10-CM | POA: Diagnosis not present
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
## 2023-08-09 NOTE — Progress Notes (Signed)
Left hip x-ray shows arthritis on both sides.  The prior surgery looks to be intact in a good shape.

## 2023-08-09 NOTE — Progress Notes (Signed)
Low back x-ray shows arthritis.

## 2023-08-13 ENCOUNTER — Ambulatory Visit: Payer: Self-pay

## 2023-08-13 ENCOUNTER — Encounter: Payer: Self-pay | Admitting: Physical Therapy

## 2023-08-13 ENCOUNTER — Ambulatory Visit (INDEPENDENT_AMBULATORY_CARE_PROVIDER_SITE_OTHER): Payer: Medicare Other | Admitting: Family Medicine

## 2023-08-13 ENCOUNTER — Ambulatory Visit: Payer: Medicare Other | Admitting: Physical Therapy

## 2023-08-13 VITALS — BP 122/76 | HR 92 | Ht 66.0 in | Wt 172.0 lb

## 2023-08-13 DIAGNOSIS — M25552 Pain in left hip: Secondary | ICD-10-CM

## 2023-08-13 DIAGNOSIS — R262 Difficulty in walking, not elsewhere classified: Secondary | ICD-10-CM

## 2023-08-13 DIAGNOSIS — M6281 Muscle weakness (generalized): Secondary | ICD-10-CM

## 2023-08-13 DIAGNOSIS — G8929 Other chronic pain: Secondary | ICD-10-CM

## 2023-08-13 NOTE — Progress Notes (Signed)
   [name], PhD, LAT, ATC acting as a scribe for [name], MD.  Annette Richard is a 67 y.o. female who presents to [facility] today for f/u L hip and low back pain. Pt was last seen by Dr. Denyse Amass on 07/16/23 and was prescribed tizanidine, and referred to PT, completing 7 visits.  Today, pt reports some improvement and then the other night experienced terrible muscle spasm and shooting pain. Pt locates pain to the L-side of her low back, around L knee, and numbness of the L thigh.   Dx imaging: 07/16/23 L-spine & L hip XR  Pertinent review of systems: No fevers or chills  Relevant historical information: History of breast cancer.  History of a left anterior hip dislocation and greater trochanteric fracture repaired with surgery in 2022   Exam:  BP 122/76   Pulse 92   Ht 5\' 6"  (1.676 m)   Wt 172 lb (78 kg)   SpO2 97%   BMI 27.76 kg/m  General: Well Developed, well nourished, and in no acute distress.   MSK: L-spine nontender midline lumbar spine.  Tender palpation lumbar paraspinal musculature. Left hip tender palpation greater trochanter.  Decreased hip abduction range of motion and strength.    Lab and Radiology Results  Procedure: Real-time Ultrasound Guided Injection of left lateral hip greater trochanter bursa Device: Philips Affiniti 50G/GE Logiq Images permanently stored and available for review in PACS Verbal informed consent obtained.  Discussed risks and benefits of procedure. Warned about infection, bleeding, hyperglycemia damage to structures among others. Patient expresses understanding and agreement Time-out conducted.   Noted no overlying erythema, induration, or other signs of local infection.   Skin prepped in a sterile fashion.   Local anesthesia: Topical Ethyl chloride.   With sterile technique and under real time ultrasound guidance: 40 mg of Kenalog and 2 mL's of Marcaine injected into greater trochanter bursa.  Fluid seen entering the bursa.   Completed without difficulty   Pain moderately immediately resolved suggesting accurate placement of the medication.   Advised to call if fevers/chills, erythema, induration, drainage, or persistent bleeding.   Images permanently stored and available for review in the ultrasound unit.  Impression: Technically successful ultrasound guided injection.         Assessment and Plan: 67 y.o. female with left buttocks and low back and lateral hip pain.  Multifactorial.  She was improvement with PT but had a setback.  Majority pain is located at the lateral hip thought to be trochanteric bursitis.  Plan for steroid injection today and continued PT.   PDMP not reviewed this encounter. Orders Placed This Encounter  Procedures   [place] LIMITED JOINT SPACE STRUCTURES LOW LEFT(NO LINKED CHARGES)    Order Specific Question:   Reason for Exam (SYMPTOM  OR DIAGNOSIS REQUIRED)    Answer:   left hip pain    Order Specific Question:   Preferred imaging location?    Answer:   [facility]   No orders of the defined types were placed in this encounter.    Discussed warning signs or symptoms. Please see discharge instructions. Patient expresses understanding.   The above documentation has been reviewed and is accurate and complete [name], M.D.

## 2023-08-13 NOTE — Patient Instructions (Addendum)
Thank you for coming in today.   You received an injection today. Seek immediate medical attention if the joint becomes red, extremely painful, or is oozing fluid.   Continue PT.
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
## 2023-08-14 ENCOUNTER — Ambulatory Visit: Payer: Medicare Other | Admitting: Physical Therapy

## 2023-08-14 DIAGNOSIS — G8929 Other chronic pain: Secondary | ICD-10-CM

## 2023-08-14 DIAGNOSIS — M25552 Pain in left hip: Secondary | ICD-10-CM

## 2023-08-14 DIAGNOSIS — M6281 Muscle weakness (generalized): Secondary | ICD-10-CM
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
## 2023-08-20 ENCOUNTER — Other Ambulatory Visit: Payer: Self-pay | Admitting: Hematology and Oncology

## 2023-08-20 ENCOUNTER — Other Ambulatory Visit: Payer: Self-pay | Admitting: Internal Medicine

## 2023-08-20 DIAGNOSIS — I10 Essential (primary) hypertension: Secondary | ICD-10-CM

## 2023-08-20 DIAGNOSIS — I700 Atherosclerosis of aorta: Secondary | ICD-10-CM

## 2023-08-21 ENCOUNTER — Ambulatory Visit: Payer: Medicare Other | Admitting: Physical Therapy

## 2023-08-22 ENCOUNTER — Other Ambulatory Visit: Payer: Self-pay

## 2023-08-22 ENCOUNTER — Ambulatory Visit: Payer: Medicare Other | Attending: Family Medicine | Admitting: Physical Therapy

## 2023-08-22 ENCOUNTER — Encounter: Payer: Self-pay | Admitting: Family Medicine

## 2023-08-22 DIAGNOSIS — G8929 Other chronic pain: Secondary | ICD-10-CM

## 2023-08-22 DIAGNOSIS — M25552 Pain in left hip: Secondary | ICD-10-CM

## 2023-08-22 DIAGNOSIS — M6281 Muscle weakness (generalized): Secondary | ICD-10-CM | POA: Diagnosis present

## 2023-08-22 DIAGNOSIS — R262 Difficulty in walking, not elsewhere classified: Secondary | ICD-10-CM | POA: Diagnosis present

## 2023-08-22 DIAGNOSIS — M5442 Lumbago with sciatica, left side: Secondary | ICD-10-CM | POA: Diagnosis present
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
## 2023-08-27 ENCOUNTER — Ambulatory Visit: Payer: Medicare Other | Admitting: Physical Therapy

## 2023-08-27 DIAGNOSIS — G8929 Other chronic pain: Secondary | ICD-10-CM

## 2023-08-27 DIAGNOSIS — M25552 Pain in left hip: Secondary | ICD-10-CM

## 2023-08-27 DIAGNOSIS — M6281 Muscle weakness (generalized): Secondary | ICD-10-CM
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
## 2023-08-30 ENCOUNTER — Ambulatory Visit: Payer: Medicare Other | Admitting: Physical Therapy

## 2023-08-30 DIAGNOSIS — M25552 Pain in left hip: Secondary | ICD-10-CM

## 2023-08-30 DIAGNOSIS — G8929 Other chronic pain: Secondary | ICD-10-CM

## 2023-08-30 DIAGNOSIS — M6281 Muscle weakness (generalized): Secondary | ICD-10-CM
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
## 2023-09-04 ENCOUNTER — Ambulatory Visit: Payer: 59 | Admitting: Orthopaedic Surgery

## 2023-09-04 ENCOUNTER — Encounter: Payer: 59 | Admitting: Physical Therapy

## 2023-09-04 NOTE — Progress Notes (Unsigned)
Office Visit Note   Patient: Annette Richard           Date of Birth: Mar 08, 1956           MRN: [id number] Visit Date: 09/05/2023              Requested by: Rodolph Bong, MD [address] [place],  [place] [address] PCP: Marcine Matar, MD   Assessment & Plan: Visit Diagnoses: No diagnosis found.  Plan: ***  Follow-Up Instructions: No follow-ups on file.   Orders:  No orders of the defined types were placed in this encounter.  No orders of the defined types were placed in this encounter.     Procedures: No procedures performed   Clinical Data: No additional findings.   Subjective: No chief complaint on file.   HPI  Review of Systems  Constitutional: Negative.   HENT: Negative.    Eyes: Negative.   Respiratory: Negative.    Cardiovascular: Negative.   Endocrine: Negative.   Musculoskeletal: Negative.   Neurological: Negative.   Hematological: Negative.   Psychiatric/Behavioral: Negative.    All other systems reviewed and are negative.   Objective: Vital Signs: There were no vitals taken for this visit.  Physical Exam Vitals and nursing note reviewed.  Constitutional:      Appearance: She is well-developed.  HENT:     Head: Atraumatic.     Nose: Nose normal.  Eyes:     Extraocular Movements: Extraocular movements intact.  Cardiovascular:     Pulses: Normal pulses.  Pulmonary:     Effort: Pulmonary effort is normal.  Abdominal:     Palpations: Abdomen is soft.  Musculoskeletal:     Cervical back: Neck supple.  Skin:    General: Skin is warm.     Capillary Refill: Capillary refill takes less than 2 seconds.  Neurological:     Mental Status: She is alert. Mental status is at baseline.  Psychiatric:        Behavior: Behavior normal.        Thought Content: Thought content normal.        Judgment: Judgment normal.   Ortho Exam  Specialty Comments:  No specialty comments available.  Imaging: No results found.   PMFS  History: Patient Active Problem List   Diagnosis Date Noted  . Osteopenia of both hips 10/18/2022  . Genetic testing 11/13/2021  . Family history of pancreatic cancer 10/18/2021  . Family history of breast cancer 10/18/2021  . Malignant neoplasm of upper-outer quadrant of left breast in female, estrogen receptor positive (HCC) 10/16/2021  . Mass of upper inner quadrant of left breast 09/14/2021  . Tobacco dependence 09/14/2021  . Normocytic anemia 09/14/2021  . Vitamin D deficiency 09/14/2021  . Aortic atherosclerosis (HCC) 09/14/2021  . Anterior dislocation of left hip (HCC) 07/12/2021  . Closed displaced fracture of greater trochanter of left femur (HCC) 07/12/2021  . S/p left hip fracture 07/10/2021  . Plantar fasciitis 02/19/2018  . TIA (transient ischemic attack) 06/26/2016  . Mixed hyperlipidemia 03/25/2015  . Essential hypertension 03/24/2015  . GERD (gastroesophageal reflux disease) 05/12/2011  . Environmental allergies 05/12/2011   Past Medical History:  Diagnosis Date  . Arthritis   . Breast cancer (HCC)   . Osteopenia     Family History  Problem Relation Age of Onset  . Pancreatic cancer Mother 50  . Mesothelioma Father 59  . Breast cancer Sister 97       reports negative genetic  testing  . Colon polyps Sister        precancerous  . Breast cancer Sister        dx. 30s  . Pulmonary fibrosis Brother   . CVA Maternal Grandfather   . Breast cancer Maternal Great-grandmother     Past Surgical History:  Procedure Laterality Date  . ABDOMINAL HYSTERECTOMY    . APPENDECTOMY    . appynedectomy    . BREAST LUMPECTOMY WITH RADIOACTIVE SEED AND SENTINEL LYMPH NODE BIOPSY Left 11/16/2021   Procedure: LEFT BREAST LUMPECTOMY WITH RADIOACTIVE SEED AND SENTINEL LYMPH NODE BIOPSY;  Surgeon: Harriette Bouillon, MD;  Location: [facility];  Service: General;  Laterality: Left;  . CHOLECYSTECTOMY    . FACIAL LACERATION REPAIR N/A 07/10/2021   Procedure: FACIAL  LACERATION REPAIR;  Surgeon: Diamantina Monks, MD;  Location: [facility];  Service: General;  Laterality: N/A;  . HIP CLOSED REDUCTION Left 07/10/2021   Procedure: ATTEMPTED CLOSED REDUCTION HIP, OPEN REDUCTION LEFT HIP;  Surgeon: Roby Lofts, MD;  Location: [facility];  Service: Orthopedics;  Laterality: Left;  . KNEE ARTHROSCOPY WITH MENISCAL REPAIR Right   . KNEE SURGERY     Social History   Occupational History  . Occupation: Retired Public house manager  Tobacco Use  . Smoking status: Every Day    Current packs/day: 1.00    Average packs/day: 1 pack/day for 15.0 years (15.0 ttl pk-yrs)    Types: Cigarettes  . Smokeless tobacco: Never  Vaping Use  . Vaping status: Never Used  Substance and Sexual Activity  . Alcohol use: Not Currently  . Drug use: Never  . Sexual activity: Never    Birth control/protection: Abstinence

## 2023-09-05 ENCOUNTER — Ambulatory Visit: Payer: Medicare Other | Admitting: Orthopaedic Surgery

## 2023-09-05 ENCOUNTER — Ambulatory Visit: Payer: Medicare Other | Admitting: Physical Therapy

## 2023-09-05 ENCOUNTER — Encounter: Payer: Self-pay | Admitting: Orthopaedic Surgery

## 2023-09-05 DIAGNOSIS — M87051 Idiopathic aseptic necrosis of right femur: Secondary | ICD-10-CM

## 2023-09-05 DIAGNOSIS — G8929 Other chronic pain: Secondary | ICD-10-CM

## 2023-09-05 DIAGNOSIS — M25552 Pain in left hip: Secondary | ICD-10-CM | POA: Diagnosis not present

## 2023-09-05 DIAGNOSIS — M87052 Idiopathic aseptic necrosis of left femur: Secondary | ICD-10-CM | POA: Insufficient documentation

## 2023-09-05 DIAGNOSIS — M6281 Muscle weakness (generalized): Secondary | ICD-10-CM
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
## 2023-09-09 ENCOUNTER — Ambulatory Visit: Payer: Medicare Other

## 2023-09-09 DIAGNOSIS — M25552 Pain in left hip: Secondary | ICD-10-CM | POA: Diagnosis not present

## 2023-09-09 DIAGNOSIS — G8929 Other chronic pain: Secondary | ICD-10-CM

## 2023-09-09 DIAGNOSIS — R262 Difficulty in walking, not elsewhere classified: Secondary | ICD-10-CM

## 2023-09-09 DIAGNOSIS — M6281 Muscle weakness (generalized): Secondary | ICD-10-CM
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
## 2023-09-13 ENCOUNTER — Ambulatory Visit: Payer: Medicare Other | Admitting: Physical Therapy

## 2023-09-13 ENCOUNTER — Encounter: Payer: Self-pay | Admitting: Physical Therapy

## 2023-09-13 DIAGNOSIS — M25552 Pain in left hip: Secondary | ICD-10-CM

## 2023-09-13 DIAGNOSIS — M6281 Muscle weakness (generalized): Secondary | ICD-10-CM

## 2023-09-13 DIAGNOSIS — G8929 Other chronic pain: Secondary | ICD-10-CM

## 2023-09-13 DIAGNOSIS — R262 Difficulty in walking, not elsewhere classified: Secondary | ICD-10-CM
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
## 2023-09-17 ENCOUNTER — Other Ambulatory Visit: Payer: Self-pay | Admitting: Internal Medicine

## 2023-09-17 DIAGNOSIS — I700 Atherosclerosis of aorta: Secondary | ICD-10-CM

## 2023-09-19 ENCOUNTER — Telehealth: Payer: Self-pay | Admitting: Orthopaedic Surgery

## 2023-09-19 NOTE — Telephone Encounter (Signed)
 Patient is scheduled for left hip hardware removal 11-04-23.  This date works well for her because of limited transportation.  She is asking for something to help with the pain until the surgery date as there are days of no pain in the hip, and then there are days she has grinding and popping which really bothers her especially at night .  She normally takes the ibuprofen  on bad days but says it does not touch the pain.  Patient uses [facility].  She asks that someone contact her if it is called in.

## 2023-09-20 ENCOUNTER — Other Ambulatory Visit: Payer: Self-pay | Admitting: Physician Assistant

## 2023-09-20 MED ORDER — TRAMADOL HCL 50 MG PO TABS: 50.0000 mg | ORAL_TABLET | Freq: Two times a day (BID) | ORAL | 0 refills | Status: DC | PRN

## 2023-09-20 NOTE — Telephone Encounter (Signed)
 Called and notified patient.

## 2023-09-20 NOTE — Telephone Encounter (Signed)
 Sent in tramadol

## 2023-09-21 ENCOUNTER — Other Ambulatory Visit: Payer: Self-pay | Admitting: Internal Medicine

## 2023-09-21 DIAGNOSIS — I10 Essential (primary) hypertension: Secondary | ICD-10-CM

## 2023-10-14 ENCOUNTER — Ambulatory Visit: Payer: Medicare Other

## 2023-10-17 ENCOUNTER — Other Ambulatory Visit: Payer: Self-pay | Admitting: Physician Assistant

## 2023-10-28 ENCOUNTER — Encounter: Payer: Self-pay | Admitting: Internal Medicine

## 2023-10-28 ENCOUNTER — Other Ambulatory Visit: Payer: Self-pay | Admitting: Physician Assistant

## 2023-10-28 ENCOUNTER — Encounter: Payer: Self-pay | Admitting: Hematology and Oncology

## 2023-10-28 MED ORDER — ASPIRIN 81 MG PO CHEW: 81.0000 mg | CHEWABLE_TABLET | Freq: Two times a day (BID) | ORAL | 0 refills | Status: DC

## 2023-10-28 MED ORDER — HYDROCODONE-ACETAMINOPHEN 5-325 MG PO TABS
1.0000 | ORAL_TABLET | Freq: Three times a day (TID) | ORAL | 0 refills | Status: DC | PRN
Start: 2023-10-28 — End: 2024-01-02

## 2023-10-28 MED ORDER — ONDANSETRON HCL 4 MG PO TABS: 4.0000 mg | ORAL_TABLET | Freq: Three times a day (TID) | ORAL | 0 refills | Status: DC | PRN

## 2023-10-29 ENCOUNTER — Encounter (HOSPITAL_COMMUNITY): Payer: Self-pay

## 2023-10-29 ENCOUNTER — Other Ambulatory Visit: Payer: Self-pay | Admitting: Hematology and Oncology

## 2023-10-29 NOTE — Pre-Procedure Instructions (Signed)
Surgical Instructions   Your procedure is scheduled on November 04, 2023. Report to [facility] Main Entrance "A" at 5:30 A.M., then check in with the Admitting office. Any questions or running late day of surgery: call [phone]  Questions prior to your surgery date: call [phone], Monday-Friday, 8am-4pm. If you experience any cold or flu symptoms such as cough, fever, chills, shortness of breath, etc. between now and your scheduled surgery, please notify us at the above number.     Remember:  Do not eat after midnight the night before your surgery   You may drink clear liquids until 4:15 AM the morning of your surgery.   Clear liquids allowed are: Water, Non-Citrus Juices (without pulp), Carbonated Beverages, Clear Tea (no milk, honey, etc.), Black Coffee Only (NO MILK, CREAM OR POWDERED CREAMER of any kind), and Gatorade.  Patient Instructions  The night before surgery:  No food after midnight. ONLY clear liquids after midnight  The day of surgery (if you do NOT have diabetes):  Drink ONE (1) Pre-Surgery Clear Ensure by 4:15 AM the morning of surgery. Drink in one sitting. Do not sip.  This drink was given to you during your hospital  pre-op appointment visit.  Nothing else to drink after completing the  Pre-Surgery Clear Ensure.         If you have questions, please contact your surgeon's office.   Take these medicines the morning of surgery with A SIP OF WATER: amLODipine (NORVASC)  anastrozole (ARIMIDEX)  atorvastatin (LIPITOR)    May take these medicines IF NEEDED: traMADol (ULTRAM)    One week prior to surgery, STOP taking any Aspirin (unless otherwise instructed by your surgeon) Aleve, Naproxen, Ibuprofen, Motrin, Advil, Goody's, BC's, all herbal medications, fish oil, and non-prescription vitamins.                     Do NOT Smoke (Tobacco/Vaping) for 24 hours prior to your procedure.  If you use a CPAP at night, you may bring your mask/headgear for  your overnight stay.   You will be asked to remove any contacts, glasses, piercing's, hearing aid's, dentures/partials prior to surgery. Please bring cases for these items if needed.    Patients discharged the day of surgery will not be allowed to drive home, and someone needs to stay with them for 24 hours.  SURGICAL WAITING ROOM VISITATION Patients may have no more than 2 support people in the waiting area - these visitors may rotate.   Pre-op nurse will coordinate an appropriate time for 1 ADULT support person, who may not rotate, to accompany patient in pre-op.  Children under the age of 31 must have an adult with them who is not the patient and must remain in the main waiting area with an adult.  If the patient needs to stay at the hospital during part of their recovery, the visitor guidelines for inpatient rooms apply.  Please refer to the [facility] website for the visitor guidelines for any additional information.   If you received a COVID test during your pre-op visit  it is requested that you wear a mask when out in public, stay away from anyone that may not be feeling well and notify your surgeon if you develop symptoms. If you have been in contact with anyone that has tested positive in the last 10 days please notify you surgeon.      Pre-operative CHG Bathing Instructions   You can play a key role in reducing  the risk of infection after surgery. Your skin needs to be as free of germs as possible. You can reduce the number of germs on your skin by washing with CHG (chlorhexidine gluconate) soap before surgery. CHG is an antiseptic soap that kills germs and continues to kill germs even after washing.   DO NOT use if you have an allergy to chlorhexidine/CHG or antibacterial soaps. If your skin becomes reddened or irritated, stop using the CHG and notify one of our RNs at [phone].              TAKE A SHOWER THE NIGHT BEFORE SURGERY AND THE DAY OF SURGERY    Please keep in  mind the following:  DO NOT shave, including legs and underarms, 48 hours prior to surgery.   You may shave your face before/day of surgery.  Place clean sheets on your bed the night before surgery Use a clean washcloth (not used since being washed) for each shower. DO NOT sleep with pet's night before surgery.  CHG Shower Instructions:  Wash your face and private area with normal soap. If you choose to wash your hair, wash first with your normal shampoo.  After you use shampoo/soap, rinse your hair and body thoroughly to remove shampoo/soap residue.  Turn the water OFF and apply half the bottle of CHG soap to a CLEAN washcloth.  Apply CHG soap ONLY FROM YOUR NECK DOWN TO YOUR TOES (washing for 3-5 minutes)  DO NOT use CHG soap on face, private areas, open wounds, or sores.  Pay special attention to the area where your surgery is being performed.  If you are having back surgery, having someone wash your back for you may be helpful. Wait 2 minutes after CHG soap is applied, then you may rinse off the CHG soap.  Pat dry with a clean towel  Put on clean pajamas    Additional instructions for the day of surgery: DO NOT APPLY any lotions, deodorants, cologne, or perfumes.   Do not wear jewelry or makeup Do not wear nail polish, gel polish, artificial nails, or any other type of covering on natural nails (fingers and toes) Do not bring valuables to the hospital. [facility] is not responsible for valuables/personal belongings. Put on clean/comfortable clothes.  Please brush your teeth.  Ask your nurse before applying any prescription medications to the skin.

## 2023-10-30 ENCOUNTER — Encounter (HOSPITAL_COMMUNITY): Payer: Self-pay

## 2023-10-30 ENCOUNTER — Encounter (HOSPITAL_COMMUNITY)
Admission: RE | Admit: 2023-10-30 | Discharge: 2023-10-30 | Disposition: A | Discharge status: Home / Self Care | Payer: Medicare Other | Source: Ambulatory Visit | Attending: Orthopaedic Surgery | Admitting: Orthopaedic Surgery

## 2023-10-30 ENCOUNTER — Other Ambulatory Visit: Payer: Self-pay

## 2023-10-30 ENCOUNTER — Telehealth: Payer: Self-pay

## 2023-10-30 VITALS — BP 129/79 | HR 71 | Temp 98.2°F | Resp 18 | Ht 66.0 in | Wt 165.9 lb

## 2023-10-30 DIAGNOSIS — M25552 Pain in left hip: Secondary | ICD-10-CM | POA: Diagnosis not present

## 2023-10-30 DIAGNOSIS — Z01810 Encounter for preprocedural cardiovascular examination: Secondary | ICD-10-CM | POA: Diagnosis present

## 2023-10-30 DIAGNOSIS — Z01812 Encounter for preprocedural laboratory examination: Secondary | ICD-10-CM | POA: Diagnosis present

## 2023-10-30 DIAGNOSIS — I2510 Atherosclerotic heart disease of native coronary artery without angina pectoris: Secondary | ICD-10-CM | POA: Insufficient documentation

## 2023-10-30 DIAGNOSIS — Z01818 Encounter for other preprocedural examination: Secondary | ICD-10-CM | POA: Diagnosis not present

## 2023-10-30 HISTORY — DX: Pneumonia, unspecified organism: J18.9

## 2023-10-30 HISTORY — DX: Essential (primary) hypertension: I10

## 2023-10-30 LAB — BASIC METABOLIC PANEL
Anion gap: 15 (ref 5–15)
BUN: 11 mg/dL (ref 8–23)
CO2: 24 mmol/L (ref 22–32)
Calcium: 10.3 mg/dL (ref 8.9–10.3)
Chloride: 101 mmol/L (ref 98–111)
Creatinine, Ser: 0.78 mg/dL (ref 0.44–1.00)
GFR, Estimated: >60 mL/min (ref >60)
Glucose, Bld: 111 mg/dL — ABNORMAL HIGH (ref 70–99)
Potassium: 3.4 mmol/L — ABNORMAL LOW (ref 3.5–5.1)
Sodium: 140 mmol/L (ref 135–145)

## 2023-10-30 LAB — CBC
HCT: 42.3 % (ref 36.0–46.0)
Hemoglobin: 13.8 g/dL (ref 12.0–15.0)
MCH: 28.5 pg (ref 26.0–34.0)
MCHC: 32.6 g/dL (ref 30.0–36.0)
MCV: 87.4 fL (ref 80.0–100.0)
Platelets: 266 10*3/uL (ref 150–400)
RBC: 4.84 MIL/uL (ref 3.87–5.11)
RDW: 12.6 % (ref 11.5–15.5)
WBC: 7.3 10*3/uL (ref 4.0–10.5)
nRBC: 0 % (ref 0.0–0.2)

## 2023-10-30 NOTE — Telephone Encounter (Signed)
Copied from CRM [id number]. Topic: Appointments - Scheduling Inquiry for Clinic >> Oct 30, 2023  3:08 PM Fuller Mandril wrote: Reason for CRM: Patient called states provider requested that she make an appt to be seen prior to surgery. Surgery Mon 2/17. Check schedule no appts available prior. Called [facility], only had an opening for 2/13 at 2:10 pt not able to make that appt. Patient states she does not think pre op clearance is needed but will call back if it is otherwise will just keep appt on 3/3.

## 2023-10-30 NOTE — Progress Notes (Signed)
PCP - Dr. Jonah Blue Cardiologist - Denies  PPM/ICD - Denies Device Orders - n/a Rep Notified - n/a  Chest x-ray - n/a EKG - 10/30/2023 Stress Test - Denies ECHO - 06/26/2016 Cardiac Cath - Denies  Sleep Study - Denies CPAP - n/a  No DM  Last dose of GLP1 agonist- n/a GLP1 instructions: n/a  Blood Thinner Instructions: n/a Aspirin Instructions: n/a  ERAS Protcol - Clear liquids until 0415 morning of surgery PRE-SURGERY Ensure or G2- Ensure given to pt with instructions  COVID TEST- n/a   Anesthesia review: No.  Patient denies shortness of breath, fever, cough and chest pain at PAT appointment. Pt denies any respiratory illness/infection in the last two months   All instructions explained to the patient, with a verbal understanding of the material. Patient agrees to go over the instructions while at home for a better understanding. Patient also instructed to self quarantine after being tested for COVID-19. The opportunity to ask questions was provided.

## 2023-10-31 NOTE — Telephone Encounter (Signed)
Called but no answer. LVM to call back.

## 2023-11-01 MED ORDER — TRANEXAMIC ACID 1000 MG/10ML IV SOLN
2000.0000 mg | INTRAVENOUS | Status: DC
  Filled 2023-11-01: qty 20

## 2023-11-03 NOTE — Anesthesia Preprocedure Evaluation (Signed)
Anesthesia Evaluation  Patient identified by MRN, date of birth, ID band Patient awake    Reviewed: Allergy & Precautions, NPO status , Patient's Chart, lab work & pertinent test results  History of Anesthesia Complications Negative for: history of anesthetic complications  Airway Mallampati: II  TM Distance: >3 FB Neck ROM: Full    Dental  (+) Edentulous Upper, Poor Dentition, Dental Advisory Given   Pulmonary Current Smoker and Patient abstained from smoking.   breath sounds clear to auscultation       Cardiovascular hypertension, Pt. on medications (-) angina  Rhythm:Regular Rate:Normal  '17 ECHO: EF 65%, normal LVF, Grade 1 DD, moderate MR   Neuro/Psych TIA negative psych ROS   GI/Hepatic Neg liver ROS,GERD  Controlled,,  Endo/Other  negative endocrine ROS    Renal/GU negative Renal ROS     Musculoskeletal  (+) Arthritis ,    Abdominal   Peds  Hematology negative hematology ROS (+)   Anesthesia Other Findings H/o breast cancer  Reproductive/Obstetrics                             Anesthesia Physical Anesthesia Plan  ASA: 2  Anesthesia Plan: General   Post-op Pain Management: Tylenol PO (pre-op)*   Induction: Intravenous  PONV Risk Score and Plan: 2 and Dexamethasone  Airway Management Planned: Oral ETT  Additional Equipment: None  Intra-op Plan:   Post-operative Plan: Extubation in OR  Informed Consent: I have reviewed the patients History and Physical, chart, labs and discussed the procedure including the risks, benefits and alternatives for the proposed anesthesia with the patient or authorized representative who has indicated his/her understanding and acceptance.     Dental advisory given  Plan Discussed with: CRNA and Surgeon  Anesthesia Plan Comments:         Anesthesia Quick Evaluation

## 2023-11-04 ENCOUNTER — Encounter (HOSPITAL_COMMUNITY)
Admission: RE | Disposition: A | Discharge status: Home / Self Care | Payer: Self-pay | Source: Ambulatory Visit | Attending: Orthopaedic Surgery

## 2023-11-04 ENCOUNTER — Ambulatory Visit (HOSPITAL_BASED_OUTPATIENT_CLINIC_OR_DEPARTMENT_OTHER): Payer: Medicare Other | Admitting: Anesthesiology

## 2023-11-04 ENCOUNTER — Ambulatory Visit (HOSPITAL_COMMUNITY): Payer: Medicare Other

## 2023-11-04 ENCOUNTER — Other Ambulatory Visit: Payer: Self-pay

## 2023-11-04 ENCOUNTER — Ambulatory Visit (HOSPITAL_COMMUNITY): Payer: Self-pay | Admitting: Anesthesiology

## 2023-11-04 ENCOUNTER — Ambulatory Visit (HOSPITAL_COMMUNITY)
Admission: RE | Admit: 2023-11-04 | Discharge: 2023-11-04 | Disposition: A | Discharge status: Home / Self Care | Payer: Medicare Other | Source: Ambulatory Visit | Attending: Orthopaedic Surgery | Admitting: Orthopaedic Surgery

## 2023-11-04 DIAGNOSIS — I10 Essential (primary) hypertension: Secondary | ICD-10-CM | POA: Diagnosis not present

## 2023-11-04 DIAGNOSIS — Z5986 Financial insecurity: Secondary | ICD-10-CM | POA: Insufficient documentation

## 2023-11-04 DIAGNOSIS — Z5982 Transportation insecurity: Secondary | ICD-10-CM | POA: Insufficient documentation

## 2023-11-04 DIAGNOSIS — M25552 Pain in left hip: Secondary | ICD-10-CM

## 2023-11-04 DIAGNOSIS — F17210 Nicotine dependence, cigarettes, uncomplicated: Secondary | ICD-10-CM | POA: Insufficient documentation

## 2023-11-04 DIAGNOSIS — Z472 Encounter for removal of internal fixation device: Secondary | ICD-10-CM | POA: Diagnosis not present

## 2023-11-04 DIAGNOSIS — T8484XA Pain due to internal orthopedic prosthetic devices, implants and grafts, initial encounter: Secondary | ICD-10-CM

## 2023-11-04 DIAGNOSIS — K219 Gastro-esophageal reflux disease without esophagitis: Secondary | ICD-10-CM | POA: Insufficient documentation

## 2023-11-04 DIAGNOSIS — Z79899 Other long term (current) drug therapy: Secondary | ICD-10-CM | POA: Diagnosis not present

## 2023-11-04 HISTORY — PX: HARDWARE REMOVAL: SHX979

## 2023-11-04 SURGERY — REMOVAL, HARDWARE
Anesthesia: General | Site: Hip | Laterality: Left

## 2023-11-04 MED ORDER — CEFAZOLIN SODIUM-DEXTROSE 2-4 GM/100ML-% IV SOLN
2.0000 g | INTRAVENOUS | Status: AC
  Administered 2023-11-04: 2 g via INTRAVENOUS

## 2023-11-04 MED ORDER — FENTANYL CITRATE (PF) 250 MCG/5ML IJ SOLN
INTRAMUSCULAR | Status: AC
Start: 2023-11-04 — End: ?
  Filled 2023-11-04: qty 5

## 2023-11-04 MED ORDER — SUGAMMADEX SODIUM 200 MG/2ML IV SOLN
INTRAVENOUS | Status: DC | PRN
  Administered 2023-11-04: 200 mg via INTRAVENOUS

## 2023-11-04 MED ORDER — MIDAZOLAM HCL 5 MG/5ML IJ SOLN
INTRAMUSCULAR | Status: DC | PRN
  Administered 2023-11-04: 2 mg via INTRAVENOUS

## 2023-11-04 MED ORDER — MIDAZOLAM HCL 2 MG/2ML IJ SOLN: 0.5000 mg | Freq: Once | INTRAMUSCULAR | Status: DC | PRN

## 2023-11-04 MED ORDER — CEFAZOLIN SODIUM-DEXTROSE 2-4 GM/100ML-% IV SOLN
INTRAVENOUS | Status: AC
  Filled 2023-11-04: qty 100

## 2023-11-04 MED ORDER — SODIUM CHLORIDE 0.9 % IR SOLN
Status: DC | PRN
  Administered 2023-11-04: 3000 mL

## 2023-11-04 MED ORDER — BUPIVACAINE HCL (PF) 0.25 % IJ SOLN
INTRAMUSCULAR | Status: DC | PRN
  Administered 2023-11-04: 20 mL

## 2023-11-04 MED ORDER — MIDAZOLAM HCL 2 MG/2ML IJ SOLN
INTRAMUSCULAR | Status: AC
  Filled 2023-11-04: qty 2

## 2023-11-04 MED ORDER — PHENYLEPHRINE HCL-NACL 20-0.9 MG/250ML-% IV SOLN
INTRAVENOUS | Status: DC | PRN
  Administered 2023-11-04: 50 ug/min via INTRAVENOUS

## 2023-11-04 MED ORDER — CHLORHEXIDINE GLUCONATE 0.12 % MT SOLN
OROMUCOSAL | Status: AC
  Administered 2023-11-04: 15 mL via OROMUCOSAL
  Filled 2023-11-04: qty 15

## 2023-11-04 MED ORDER — HYDROMORPHONE HCL 1 MG/ML IJ SOLN
INTRAMUSCULAR | Status: AC
  Filled 2023-11-04: qty 1

## 2023-11-04 MED ORDER — OXYCODONE HCL 5 MG PO TABS
ORAL_TABLET | ORAL | Status: AC
  Filled 2023-11-04: qty 1

## 2023-11-04 MED ORDER — OXYCODONE HCL 5 MG/5ML PO SOLN: 5.0000 mg | Freq: Once | ORAL | Status: AC | PRN

## 2023-11-04 MED ORDER — LIDOCAINE HCL (PF) 2 % IJ SOLN
INTRAMUSCULAR | Status: DC | PRN
  Administered 2023-11-04: 40 mg via INTRADERMAL

## 2023-11-04 MED ORDER — HYDROMORPHONE HCL 1 MG/ML IJ SOLN
0.2500 mg | INTRAMUSCULAR | Status: DC | PRN
  Administered 2023-11-04 (×4): 0.25 mg via INTRAVENOUS

## 2023-11-04 MED ORDER — LACTATED RINGERS IV SOLN: INTRAVENOUS | Status: DC

## 2023-11-04 MED ORDER — OXYCODONE HCL 5 MG PO TABS
5.0000 mg | ORAL_TABLET | Freq: Once | ORAL | Status: AC | PRN
  Administered 2023-11-04: 5 mg via ORAL

## 2023-11-04 MED ORDER — ROCURONIUM BROMIDE 100 MG/10ML IV SOLN
INTRAVENOUS | Status: DC | PRN
  Administered 2023-11-04: 10 mg via INTRAVENOUS
  Administered 2023-11-04: 60 mg via INTRAVENOUS

## 2023-11-04 MED ORDER — PROPOFOL 10 MG/ML IV BOLUS
INTRAVENOUS | Status: AC
  Filled 2023-11-04: qty 20

## 2023-11-04 MED ORDER — 0.9 % SODIUM CHLORIDE (POUR BTL) OPTIME
TOPICAL | Status: DC | PRN
  Administered 2023-11-04: 1000 mL

## 2023-11-04 MED ORDER — TRANEXAMIC ACID-NACL 1000-0.7 MG/100ML-% IV SOLN
INTRAVENOUS | Status: AC
  Filled 2023-11-04: qty 100

## 2023-11-04 MED ORDER — DEXAMETHASONE SODIUM PHOSPHATE 10 MG/ML IJ SOLN
INTRAMUSCULAR | Status: DC | PRN
  Administered 2023-11-04: 10 mg via INTRAVENOUS

## 2023-11-04 MED ORDER — AMISULPRIDE (ANTIEMETIC) 5 MG/2ML IV SOLN
INTRAVENOUS | Status: AC
  Filled 2023-11-04: qty 4

## 2023-11-04 MED ORDER — EPHEDRINE 5 MG/ML INJ
INTRAVENOUS | Status: AC
  Filled 2023-11-04: qty 5

## 2023-11-04 MED ORDER — ACETAMINOPHEN 500 MG PO TABS
ORAL_TABLET | ORAL | Status: AC
  Administered 2023-11-04: 1000 mg via ORAL
  Filled 2023-11-04: qty 2

## 2023-11-04 MED ORDER — ROCURONIUM BROMIDE 10 MG/ML (PF) SYRINGE
PREFILLED_SYRINGE | INTRAVENOUS | Status: AC
  Filled 2023-11-04: qty 10

## 2023-11-04 MED ORDER — TRANEXAMIC ACID-NACL 1000-0.7 MG/100ML-% IV SOLN
1000.0000 mg | INTRAVENOUS | Status: AC
  Administered 2023-11-04: 1000 mg via INTRAVENOUS

## 2023-11-04 MED ORDER — CHLORHEXIDINE GLUCONATE 0.12 % MT SOLN: 15.0000 mL | Freq: Once | OROMUCOSAL | Status: AC

## 2023-11-04 MED ORDER — AMISULPRIDE (ANTIEMETIC) 5 MG/2ML IV SOLN
10.0000 mg | Freq: Once | INTRAVENOUS | Status: AC | PRN
  Administered 2023-11-04: 10 mg via INTRAVENOUS

## 2023-11-04 MED ORDER — ORAL CARE MOUTH RINSE: 15.0000 mL | Freq: Once | OROMUCOSAL | Status: AC

## 2023-11-04 MED ORDER — MEPERIDINE HCL 25 MG/ML IJ SOLN: 6.2500 mg | INTRAMUSCULAR | Status: DC | PRN

## 2023-11-04 MED ORDER — BUPIVACAINE HCL (PF) 0.25 % IJ SOLN
INTRAMUSCULAR | Status: AC
  Filled 2023-11-04: qty 30

## 2023-11-04 MED ORDER — PHENYLEPHRINE HCL (PRESSORS) 10 MG/ML IV SOLN
INTRAVENOUS | Status: DC | PRN
  Administered 2023-11-04 (×3): 160 ug via INTRAVENOUS

## 2023-11-04 MED ORDER — POVIDONE-IODINE 10 % EX SWAB
2.0000 | Freq: Once | CUTANEOUS | Status: AC
  Administered 2023-11-04: 2 via TOPICAL

## 2023-11-04 MED ORDER — PROPOFOL 10 MG/ML IV BOLUS
INTRAVENOUS | Status: DC | PRN
  Administered 2023-11-04: 120 mg via INTRAVENOUS

## 2023-11-04 MED ORDER — FENTANYL CITRATE (PF) 250 MCG/5ML IJ SOLN
INTRAMUSCULAR | Status: DC | PRN
  Administered 2023-11-04 (×2): 50 ug via INTRAVENOUS
  Administered 2023-11-04: 150 ug via INTRAVENOUS

## 2023-11-04 MED ORDER — ONDANSETRON HCL 4 MG/2ML IJ SOLN
INTRAMUSCULAR | Status: DC | PRN
Start: 2023-11-04 — End: 2023-11-04
  Administered 2023-11-04: 4 mg via INTRAVENOUS

## 2023-11-04 MED ORDER — ACETAMINOPHEN 500 MG PO TABS: 1000.0000 mg | ORAL_TABLET | Freq: Once | ORAL | Status: AC

## 2023-11-04 SURGICAL SUPPLY — 65 items
BAG COUNTER SPONGE SURGICOUNT (BAG) ×1 IMPLANT
BLADE CLIPPER SURG (BLADE) ×1 IMPLANT
BRUSH FEMORAL CANAL (MISCELLANEOUS) IMPLANT
BUR EGG ELITE 4.0 (BURR) IMPLANT
COVER SURGICAL LIGHT HANDLE (MISCELLANEOUS) ×1 IMPLANT
DERMABOND ADVANCED .7 DNX12 (GAUZE/BANDAGES/DRESSINGS) IMPLANT
DRAPE HALF SHEET 40X57 (DRAPES) ×2 IMPLANT
DRAPE HIP W/POCKET STRL (MISCELLANEOUS) ×1 IMPLANT
DRAPE INCISE IOBAN 66X45 STRL (DRAPES) ×1 IMPLANT
DRAPE SURG ORHT 6 SPLT 77X108 (DRAPES) ×2 IMPLANT
DRAPE U-SHAPE 47X51 STRL (DRAPES) ×1 IMPLANT
DRSG AQUACEL AG ADV 3.5X10 (GAUZE/BANDAGES/DRESSINGS) IMPLANT
DRSG AQUACEL AG ADV 3.5X14 (GAUZE/BANDAGES/DRESSINGS) IMPLANT
DURAPREP 26ML APPLICATOR (WOUND CARE) ×3 IMPLANT
ELECT BLADE 6.5 EXT (BLADE) IMPLANT
ELECT CAUTERY BLADE 6.4 (BLADE) IMPLANT
ELECT REM PT RETURN 9FT ADLT (ELECTROSURGICAL) ×1
ELECTRODE REM PT RTRN 9FT ADLT (ELECTROSURGICAL) ×1 IMPLANT
EVACUATOR 1/8 PVC DRAIN (DRAIN) IMPLANT
FILTER STRAW FLUID ASPIR (MISCELLANEOUS) ×1 IMPLANT
GLOVE BIOGEL PI IND STRL 7.0 (GLOVE) ×2 IMPLANT
GLOVE BIOGEL PI IND STRL 7.5 (GLOVE) ×3 IMPLANT
GLOVE ECLIPSE 6.5 STRL STRAW (GLOVE) ×2 IMPLANT
GLOVE SKINSENSE STRL SZ7.5 (GLOVE) ×1 IMPLANT
GLOVE SURG SYN 7.5 E (GLOVE) ×2 IMPLANT
GLOVE SURG SYN 7.5 PF PI (GLOVE) ×2 IMPLANT
GLOVE SURG UNDER POLY LF SZ7 (GLOVE) ×19 IMPLANT
GLOVE SURG UNDER POLY LF SZ7.5 (GLOVE) ×2 IMPLANT
GOWN TOGA ZIPPER T7+ PEEL AWAY (MISCELLANEOUS) ×2 IMPLANT
HOOD PEEL AWAY T7 (MISCELLANEOUS) ×1 IMPLANT
KIT BASIN OR (CUSTOM PROCEDURE TRAY) ×1 IMPLANT
KIT TURNOVER KIT B (KITS) ×1 IMPLANT
MANIFOLD NEPTUNE II (INSTRUMENTS) ×1 IMPLANT
NDL HYPO 22X1.5 SAFETY MO (MISCELLANEOUS) IMPLANT
NDL SPNL 18GX3.5 QUINCKE PK (NEEDLE) ×1 IMPLANT
NEEDLE HYPO 22X1.5 SAFETY MO (MISCELLANEOUS) ×2 IMPLANT
NEEDLE SPNL 18GX3.5 QUINCKE PK (NEEDLE) IMPLANT
NS IRRIG 1000ML POUR BTL (IV SOLUTION) ×1 IMPLANT
PACK TOTAL JOINT (CUSTOM PROCEDURE TRAY) ×1 IMPLANT
PAD ARMBOARD 7.5X6 YLW CONV (MISCELLANEOUS) ×2 IMPLANT
PASSER SUT SWANSON 36MM LOOP (INSTRUMENTS) ×1 IMPLANT
PUTTY DBX 2.5CC (Putty) ×1 IMPLANT
PUTTY DBX 2.5CC DEPUY (Putty) IMPLANT
SET HNDPC FAN SPRY TIP SCT (DISPOSABLE) IMPLANT
SPONGE T-LAP 18X18 ~~LOC~~+RFID (SPONGE) IMPLANT
STAPLER VISISTAT 35W (STAPLE) IMPLANT
SUT ETHIBOND NAB CT1 #1 30IN (SUTURE) ×1 IMPLANT
SUT ETHILON 2 0 FS 18 (SUTURE) IMPLANT
SUT ETHILON 2 0 PSLX (SUTURE) IMPLANT
SUT PDS AB 0 CT 36 (SUTURE) ×1 IMPLANT
SUT PDS AB 1 CT 36 (SUTURE) ×1 IMPLANT
SUT VIC AB 0 CT1 27XBRD ANBCTR (SUTURE) IMPLANT
SUT VIC AB 0 CT1 27XBRD ANTBC (SUTURE) IMPLANT
SUT VIC AB 1 CT1 27XBRD ANTBC (SUTURE) ×2 IMPLANT
SUT VIC AB 1 CTX36XBRD ANBCTR (SUTURE) IMPLANT
SUT VIC AB 2-0 CT1 TAPERPNT 27 (SUTURE) ×2 IMPLANT
SYR 50ML LL SCALE MARK (SYRINGE) ×1 IMPLANT
SYR CONTROL 10ML LL (SYRINGE) IMPLANT
SYR TB 1ML LUER SLIP (SYRINGE) ×1 IMPLANT
TOWEL GREEN STERILE (TOWEL DISPOSABLE) ×1 IMPLANT
TOWEL GREEN STERILE FF (TOWEL DISPOSABLE) ×1 IMPLANT
TOWER CARTRIDGE SMART MIX (DISPOSABLE) IMPLANT
TRAY FOL W/BAG SLVR 16FR STRL (SET/KITS/TRAYS/PACK) IMPLANT
TUBE SUCT ARGYLE STRL (TUBING) ×1 IMPLANT
WATER STERILE IRR 1000ML POUR (IV SOLUTION) ×3 IMPLANT

## 2023-11-04 NOTE — Anesthesia Postprocedure Evaluation (Signed)
Anesthesia Post Note  Patient: Annette Richard  Procedure(s) Performed: LEFT HIP HARDWARE REMOVAL, BONEGRAFTING (Left: Hip)     Patient location during evaluation: PACU Anesthesia Type: General Level of consciousness: awake and alert, patient cooperative and oriented Pain management: pain level controlled Vital Signs Assessment: post-procedure vital signs reviewed and stable Respiratory status: spontaneous breathing, nonlabored ventilation and respiratory function stable Cardiovascular status: blood pressure returned to baseline and stable Postop Assessment: no apparent nausea or vomiting, able to ambulate and adequate PO intake Anesthetic complications: no   No notable events documented.  Last Vitals:  Vitals:   11/04/23 0945 11/04/23 1000  BP: 103/65 100/61  Pulse: 69 65  Resp: 14 18  Temp:  36.4 C  SpO2: 98% 95%    Last Pain:  Vitals:   11/04/23 0959  PainSc: 4                  Davonda Ausley,E. Carrol Hougland

## 2023-11-04 NOTE — H&P (Signed)
PREOPERATIVE H&P  Chief Complaint: left hip retained hardware  HPI: Annette Richard is a 68 y.o. female who presents for surgical treatment of left hip retained hardware.  She denies any changes in medical history.  Past Surgical History:  Procedure Laterality Date  . ABDOMINAL HYSTERECTOMY    . APPENDECTOMY     removed with right salpingoophorectomy  . BREAST LUMPECTOMY WITH RADIOACTIVE SEED AND SENTINEL LYMPH NODE BIOPSY Left 11/16/2021   Procedure: LEFT BREAST LUMPECTOMY WITH RADIOACTIVE SEED AND SENTINEL LYMPH NODE BIOPSY;  Surgeon: Harriette Bouillon, MD;  Location: [facility];  Service: General;  Laterality: Left;  . CATARACT EXTRACTION W/ INTRAOCULAR LENS IMPLANT Bilateral 2024  . CHOLECYSTECTOMY     2000s  . FACIAL LACERATION REPAIR N/A 07/10/2021   Procedure: FACIAL LACERATION REPAIR;  Surgeon: Diamantina Monks, MD;  Location: [facility] OR;  Service: General;  Laterality: N/A;  . HIP CLOSED REDUCTION Left 07/10/2021   Procedure: ATTEMPTED CLOSED REDUCTION HIP, OPEN REDUCTION LEFT HIP;  Surgeon: Roby Lofts, MD;  Location: [facility] OR;  Service: Orthopedics;  Laterality: Left;  . KNEE ARTHROSCOPY WITH MENISCAL REPAIR Right   . SALPINGECTOMY Left    Ectopic Pregnancy  . SALPINGOOPHORECTOMY Right    Social History   Socioeconomic History  . Marital status: Widowed    Spouse name: Not on file  . Number of children: 2  . Years of education: Not on file  . Highest education level: Not on file  Occupational History  . Occupation: Retired Public house manager  Tobacco Use  . Smoking status: Every Day    Current packs/day: 1.00    Average packs/day: 1 pack/day for 15.0 years (15.0 ttl pk-yrs)    Types: Cigarettes  . Smokeless tobacco: Never  . Tobacco comments:    Less than one pack of cigarettes as of 10/30/23  Vaping Use  . Vaping status: Never Used  Substance and Sexual Activity  . Alcohol use: Not Currently    Comment: Maybe once a year  . Drug use: Never  . Sexual  activity: Never    Birth control/protection: Abstinence  Other Topics Concern  . Not on file  Social History Narrative   ** Merged History Encounter **       Social Drivers of Health   Financial Resource Strain: High Risk (11/23/2022)   Overall Financial Resource Strain (CARDIA)   . Difficulty of Paying Living Expenses: Very hard  Food Insecurity: Food Insecurity Present (11/23/2022)   Hunger Vital Sign   . Worried About Programme researcher, broadcasting/film/video in the Last Year: Often true   . Ran Out of Food in the Last Year: Often true  Transportation Needs: Unmet Transportation Needs (01/02/2022)   PRAPARE - Transportation   . Lack of Transportation (Medical): Yes   . Lack of Transportation (Non-Medical): No  Physical Activity: Sufficiently Active (11/23/2022)   Exercise Vital Sign   . Days of Exercise per Week: 7 days   . Minutes of Exercise per Session: 30 min  Stress: No Stress Concern Present (11/23/2022)   [employer] of Occupational Health - Occupational Stress Questionnaire   . Feeling of Stress : Only a little  Social Connections: Socially Isolated (11/23/2022)   Social Connection and Isolation Panel [NHANES]   . Frequency of Communication with Friends and Family: Never   . Frequency of Social Gatherings with Friends and Family: Never   . Attends Religious Services: Never   . Active Member of Clubs or Organizations: No   .  Attends Banker Meetings: Never   . Marital Status: Widowed   Family History  Problem Relation Age of Onset  . Pancreatic cancer Mother 33  . Mesothelioma Father 61  . Breast cancer Sister 56       reports negative genetic testing  . Colon polyps Sister        precancerous  . Breast cancer Sister        dx. 30s  . Pulmonary fibrosis Brother   . CVA Maternal Grandfather   . Breast cancer Maternal Great-grandmother    No Known Allergies Prior to Admission medications   Medication Sig Start Date End Date Taking? Authorizing Provider  amLODipine  (NORVASC) 5 MG tablet TAKE 1 TABLET (5 MG TOTAL) BY MOUTH DAILY. 09/22/23  Yes Marcine Matar, MD  anastrozole (ARIMIDEX) 1 MG tablet TAKE 1 TABLET BY MOUTH EVERY DAY 08/21/23  Yes Rachel Moulds, MD  aspirin (ASPIRIN 81) 81 MG chewable tablet Chew 1 tablet (81 mg total) by mouth 2 (two) times daily. To be taken after surgery to prevent blood clots 10/28/23   Jari Sportsman L, PA-C  atorvastatin (LIPITOR) 10 MG tablet TAKE 1 TABLET BY MOUTH EVERY DAY 09/17/23  Yes Marcine Matar, MD  Calcium Carbonate-Vit D-Min (CALCIUM 1200) 1200-1000 MG-UNIT CHEW Chew 2 tablets by mouth in the morning.   Yes [provider]  HYDROcodone-acetaminophen (NORCO/VICODIN) 5-325 MG tablet Take 1 tablet by mouth 3 (three) times daily as needed for moderate pain (pain score 4-6). To be taken after surgery 10/28/23   Cristie Hem, PA-C  ibuprofen (ADVIL) 200 MG tablet Take 800 mg by mouth every 8 (eight) hours as needed (pain.).   Yes [provider]  ondansetron (ZOFRAN) 4 MG tablet Take 1 tablet (4 mg total) by mouth every 8 (eight) hours as needed for nausea or vomiting. 10/28/23   Cristie Hem, PA-C  sennosides-docusate sodium (SENOKOT-S) 8.6-50 MG tablet Take 1 tablet by mouth every other day.   Yes [provider]  traMADol (ULTRAM) 50 MG tablet Take 1 tablet (50 mg total) by mouth every 12 (twelve) hours as needed. 10/17/23  Yes Cristie Hem, PA-C  Multiple Vitamins-Iron (MULTIVITAMINS WITH IRON) TABS tablet Take 1 tablet by mouth daily. 07/14/21   Maczis, Elmer Sow, PA-C  tiZANidine (ZANAFLEX) 2 MG tablet TAKE 1 TO 2 TABLETS (2-4 MG TOTAL) BY MOUTH AT BEDTIME AS NEEDED FOR MUSCLE SPASM Patient not taking: Reported on 10/24/2023 07/23/23   Rodolph Bong, MD     Positive ROS: All other systems have been reviewed and were otherwise negative with the exception of those mentioned in the HPI and as above.  Physical Exam: General: Alert, no acute distress Cardiovascular: No pedal  edema Respiratory: No cyanosis, no use of accessory musculature GI: abdomen soft Skin: No lesions in the area of chief complaint Neurologic: Sensation intact distally Psychiatric: Patient is competent for consent with normal mood and affect Lymphatic: no lymphedema  MUSCULOSKELETAL: exam stable  Assessment: left hip retained hardware  Plan: Plan for Procedure(s): LEFT HIP HARDWARE REMOVAL, BONEGRAFTING  The risks benefits and alternatives were discussed with the patient including but not limited to the risks of nonoperative treatment, versus surgical intervention including infection, bleeding, nerve injury,  blood clots, cardiopulmonary complications, morbidity, mortality, among others, and they were willing to proceed.   Glee Arvin, MD 11/04/2023 6:34 AM

## 2023-11-04 NOTE — Progress Notes (Signed)
PREOPERATIVE H&P  Chief Complaint: left hip retained hardware  HPI: Annette Richard is a 68 y.o. female who presents for surgical treatment of left hip retained hardware.  She denies any changes in medical history.  Past Surgical History:  Procedure Laterality Date  . ABDOMINAL HYSTERECTOMY    . APPENDECTOMY     removed with right salpingoophorectomy  . BREAST LUMPECTOMY WITH RADIOACTIVE SEED AND SENTINEL LYMPH NODE BIOPSY Left 11/16/2021   Procedure: LEFT BREAST LUMPECTOMY WITH RADIOACTIVE SEED AND SENTINEL LYMPH NODE BIOPSY;  Surgeon: Harriette Bouillon, MD;  Location: [facility];  Service: General;  Laterality: Left;  . CATARACT EXTRACTION W/ INTRAOCULAR LENS IMPLANT Bilateral 2024  . CHOLECYSTECTOMY     2000s  . FACIAL LACERATION REPAIR N/A 07/10/2021   Procedure: FACIAL LACERATION REPAIR;  Surgeon: Diamantina Monks, MD;  Location: [facility];  Service: General;  Laterality: N/A;  . HIP CLOSED REDUCTION Left 07/10/2021   Procedure: ATTEMPTED CLOSED REDUCTION HIP, OPEN REDUCTION LEFT HIP;  Surgeon: Roby Lofts, MD;  Location: [facility];  Service: Orthopedics;  Laterality: Left;  . KNEE ARTHROSCOPY WITH MENISCAL REPAIR Right   . SALPINGECTOMY Left    Ectopic Pregnancy  . SALPINGOOPHORECTOMY Right    Social History   Socioeconomic History  . Marital status: Widowed    Spouse name: Not on file  . Number of children: 2  . Years of education: Not on file  . Highest education level: Not on file  Occupational History  . Occupation: Retired Public house manager  Tobacco Use  . Smoking status: Every Day    Current packs/day: 1.00    Average packs/day: 1 pack/day for 15.0 years (15.0 ttl pk-yrs)    Types: Cigarettes  . Smokeless tobacco: Never  . Tobacco comments:    Less than one pack of cigarettes as of 10/30/23  Vaping Use  . Vaping status: Never Used  Substance and Sexual Activity  . Alcohol use: Not Currently    Comment: Maybe once a year  . Drug use: Never  . Sexual  activity: Never    Birth control/protection: Abstinence  Other Topics Concern  . Not on file  Social History Narrative   ** Merged History Encounter **       Social Drivers of Health   Financial Resource Strain: High Risk (11/23/2022)   Overall Financial Resource Strain (CARDIA)   . Difficulty of Paying Living Expenses: Very hard  Food Insecurity: Food Insecurity Present (11/23/2022)   Hunger Vital Sign   . Worried About Programme researcher, broadcasting/film/video in the Last Year: Often true   . Ran Out of Food in the Last Year: Often true  Transportation Needs: Unmet Transportation Needs (01/02/2022)   PRAPARE - Transportation   . Lack of Transportation (Medical): Yes   . Lack of Transportation (Non-Medical): No  Physical Activity: Sufficiently Active (11/23/2022)   Exercise Vital Sign   . Days of Exercise per Week: 7 days   . Minutes of Exercise per Session: 30 min  Stress: No Stress Concern Present (11/23/2022)   [employer] of Occupational Health - Occupational Stress Questionnaire   . Feeling of Stress : Only a little  Social Connections: Socially Isolated (11/23/2022)   Social Connection and Isolation Panel [NHANES]   . Frequency of Communication with Friends and Family: Never   . Frequency of Social Gatherings with Friends and Family: Never   . Attends Religious Services: Never   . Active Member of Clubs or Organizations: No   .  Attends Banker Meetings: Never   . Marital Status: Widowed   Family History  Problem Relation Age of Onset  . Pancreatic cancer Mother 32  . Mesothelioma Father 37  . Breast cancer Sister 45       reports negative genetic testing  . Colon polyps Sister        precancerous  . Breast cancer Sister        dx. 30s  . Pulmonary fibrosis Brother   . CVA Maternal Grandfather   . Breast cancer Maternal Great-grandmother    No Known Allergies Prior to Admission medications   Medication Sig Start Date End Date Taking? Authorizing Provider  amLODipine  (NORVASC) 5 MG tablet TAKE 1 TABLET (5 MG TOTAL) BY MOUTH DAILY. 09/22/23  Yes Marcine Matar, MD  anastrozole (ARIMIDEX) 1 MG tablet TAKE 1 TABLET BY MOUTH EVERY DAY 08/21/23  Yes Rachel Moulds, MD  aspirin (ASPIRIN 81) 81 MG chewable tablet Chew 1 tablet (81 mg total) by mouth 2 (two) times daily. To be taken after surgery to prevent blood clots 10/28/23   Jari Sportsman L, PA-C  atorvastatin (LIPITOR) 10 MG tablet TAKE 1 TABLET BY MOUTH EVERY DAY 09/17/23  Yes Marcine Matar, MD  Calcium Carbonate-Vit D-Min (CALCIUM 1200) 1200-1000 MG-UNIT CHEW Chew 2 tablets by mouth in the morning.   Yes [provider]  HYDROcodone-acetaminophen (NORCO/VICODIN) 5-325 MG tablet Take 1 tablet by mouth 3 (three) times daily as needed for moderate pain (pain score 4-6). To be taken after surgery 10/28/23   Cristie Hem, PA-C  ibuprofen (ADVIL) 200 MG tablet Take 800 mg by mouth every 8 (eight) hours as needed (pain.).   Yes [provider]  ondansetron (ZOFRAN) 4 MG tablet Take 1 tablet (4 mg total) by mouth every 8 (eight) hours as needed for nausea or vomiting. 10/28/23   Cristie Hem, PA-C  sennosides-docusate sodium (SENOKOT-S) 8.6-50 MG tablet Take 1 tablet by mouth every other day.   Yes [provider]  traMADol (ULTRAM) 50 MG tablet Take 1 tablet (50 mg total) by mouth every 12 (twelve) hours as needed. 10/17/23  Yes Cristie Hem, PA-C  Multiple Vitamins-Iron (MULTIVITAMINS WITH IRON) TABS tablet Take 1 tablet by mouth daily. 07/14/21   Maczis, Elmer Sow, PA-C  tiZANidine (ZANAFLEX) 2 MG tablet TAKE 1 TO 2 TABLETS (2-4 MG TOTAL) BY MOUTH AT BEDTIME AS NEEDED FOR MUSCLE SPASM Patient not taking: Reported on 10/24/2023 07/23/23   Rodolph Bong, MD     Positive ROS: All other systems have been reviewed and were otherwise negative with the exception of those mentioned in the HPI and as above.  Physical Exam: General: Alert, no acute distress Cardiovascular: No pedal  edema Respiratory: No cyanosis, no use of accessory musculature GI: abdomen soft Skin: No lesions in the area of chief complaint Neurologic: Sensation intact distally Psychiatric: Patient is competent for consent with normal mood and affect Lymphatic: no lymphedema  MUSCULOSKELETAL: exam stable  Assessment: left hip retained hardware  Plan: Plan for Procedure(s): LEFT HIP HARDWARE REMOVAL, BONEGRAFTING  The risks benefits and alternatives were discussed with the patient including but not limited to the risks of nonoperative treatment, versus surgical intervention including infection, bleeding, nerve injury,  blood clots, cardiopulmonary complications, morbidity, mortality, among others, and they were willing to proceed.   Glee Arvin, MD 11/04/2023 6:01 AM

## 2023-11-04 NOTE — Op Note (Signed)
Date of Surgery: 11/04/2023  INDICATIONS: Annette Richard is a 68 y.o.-year-old female with retained hardware of the left hip from prior ORIF.  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  She presents today for staged hardware removal.  The patient did consent to the procedure after discussion of the risks and benefits.  PREOPERATIVE DIAGNOSIS: Retained hardware from the left hip  POSTOPERATIVE DIAGNOSIS: Same.  PROCEDURE:  Removal of 4 cannulated screws from the left hip through 2 separate incisions Bone graft left hip  SURGEON: N. Glee Arvin, M.D.  ASSIST: Annette Richard, [place]; necessary for the timely completion of procedure and due to complexity of procedure.  ANESTHESIA:  general, local  IV FLUIDS AND URINE: See anesthesia.  ESTIMATED BLOOD LOSS: Minimal mL.  IMPLANTS:  Implant Name Type Inv. Item Serial No. Manufacturer Lot No. LRB No. Used Action  PUTTY DBX 2.5CC - [id number] Putty PUTTY DBX 2.5CC [id number] MUSCULOSKELETL TRANSPLANT FNDN  Left 1 Implanted    DRAINS: None  COMPLICATIONS: see description of procedure.  DESCRIPTION OF PROCEDURE: The patient was brought to the operating room.  The patient had been signed prior to the procedure and this was documented. The patient had the anesthesia placed by the anesthesiologist.  A time-out was performed to confirm that this was the correct patient, site, side and location. The patient did receive antibiotics prior to the incision and was re-dosed during the procedure as needed at indicated intervals.  The patient was placed in the right lateral decubitus position on a pegboard with all bony prominences well-padded.  The patient had the operative extremity prepped and draped in the standard surgical fashion.    Fluoroscopy was brought in to help plan the surgical incisions.  We first made a proximal incision over the greater trochanter.  Incision was made  and dissection was carried down through the subcutaneous tissue.  A small portion of the IT band was split in line with the incision.  A Cobb elevator was used to clear the soft tissues around the head of the screw.  This was checked under fluoroscopy.  A guidepin was inserted down the cannulated screw and the screw was backed out on power without any difficulties.  The washer was removed with a Kocher clamp without any difficulty.  There was minimal bone loss.  We then made a separate incision a little more distally on the lateral aspect of the thigh.  Dissection was carried down through the subcutaneous tissue.  IT band was incised.  Cobb was used to bluntly elevate the vastus lateralis off of the lateral cortex.  There was significant bony overgrowth over the screw heads.  A high-speed bur was used to remove the bony overgrowth.  Each of the 3 screws was removed in a similar fashion to the for screw.  Bone graft was placed in the screw holes and around the lateral cortex.  The surgical sites were thoroughly irrigated and closed in layered fashion using #1 Vicryl for the IT band, 0 Vicryl for subcutaneous fat, 2-0 Vicryl subcuticular layer, 2-0 nylon for the skin and Dermabond.  Sterile dressings were applied.  Patient tolerated the procedure well had no many complications.  Annette Richard was necessary for opening, closing, retracting, limb positioning and overall facilitation and timely completion of the procedure.  POSTOPERATIVE PLAN: Patient will be weight-bear as tolerated to the left lower extremity.  She will follow-up in 2 weeks for suture removal and repeat radiographs of the left hip.  Annette Reel, MD 8:52 AM

## 2023-11-04 NOTE — Anesthesia Procedure Notes (Signed)
Procedure Name: Intubation Date/Time: 11/04/2023 7:55 AM  Performed by: Venia Carbon, CRNAPre-anesthesia Checklist: Patient identified, Emergency Drugs available, Suction available, Patient being monitored and Timeout performed Patient Re-evaluated:Patient Re-evaluated prior to induction Oxygen Delivery Method: Circle system utilized Preoxygenation: Pre-oxygenation with 100% oxygen Induction Type: IV induction Ventilation: Mask ventilation without difficulty Laryngoscope Size: Mac and 3 Grade View: Grade I Tube type: Oral Tube size: 7.0 mm Number of attempts: 1 Airway Equipment and Method: Stylet and Patient positioned with wedge pillow Placement Confirmation: ETT inserted through vocal cords under direct vision, positive ETCO2, CO2 detector and breath sounds checked- equal and bilateral Secured at: 21 cm

## 2023-11-04 NOTE — Discharge Instructions (Signed)

## 2023-11-04 NOTE — Transfer of Care (Signed)
Immediate Anesthesia Transfer of Care Note  Patient: Annette Richard  Procedure(s) Performed: LEFT HIP HARDWARE REMOVAL, BONEGRAFTING (Left: Hip)  Patient Location: PACU  Anesthesia Type:General  Level of Consciousness: awake, alert , sedated, and patient cooperative  Airway & Oxygen Therapy: Patient connected to face mask oxygen  Post-op Assessment: Report given to RN, Post -op Vital signs reviewed and stable, Patient moving all extremities, Patient moving all extremities X 4, and Patient able to stick tongue midline  Post vital signs: Reviewed and stable  Last Vitals:  Vitals Value Taken Time  BP 107/70 11/04/23 0920  Temp 36.5 C 11/04/23 0920  Pulse 82 11/04/23 0921  Resp 10 11/04/23 0921  SpO2 95 % 11/04/23 0921  Vitals shown include unfiled device data.  Last Pain:  Vitals:   11/04/23 0634  PainSc: 7       Patients Stated Pain Goal: 3 (11/04/23 1610)  Complications: No notable events documented.

## 2023-11-05 ENCOUNTER — Encounter (HOSPITAL_COMMUNITY): Payer: Self-pay | Admitting: Orthopaedic Surgery

## 2023-11-07 ENCOUNTER — Ambulatory Visit: Payer: 59 | Admitting: Internal Medicine

## 2023-11-18 ENCOUNTER — Ambulatory Visit: Payer: 59 | Attending: Internal Medicine | Admitting: Internal Medicine

## 2023-11-18 VITALS — BP 127/81 | HR 87 | Temp 98.1°F | Ht 66.0 in | Wt 164.0 lb

## 2023-11-18 DIAGNOSIS — Z5982 Transportation insecurity: Secondary | ICD-10-CM | POA: Insufficient documentation

## 2023-11-18 DIAGNOSIS — Z79811 Long term (current) use of aromatase inhibitors: Secondary | ICD-10-CM | POA: Insufficient documentation

## 2023-11-18 DIAGNOSIS — Z170 Estrogen receptor positive status [ER+]: Secondary | ICD-10-CM

## 2023-11-18 DIAGNOSIS — Z79899 Other long term (current) drug therapy: Secondary | ICD-10-CM | POA: Diagnosis not present

## 2023-11-18 DIAGNOSIS — I10 Essential (primary) hypertension: Secondary | ICD-10-CM

## 2023-11-18 DIAGNOSIS — E559 Vitamin D deficiency, unspecified: Secondary | ICD-10-CM | POA: Insufficient documentation

## 2023-11-18 DIAGNOSIS — F17210 Nicotine dependence, cigarettes, uncomplicated: Secondary | ICD-10-CM | POA: Diagnosis not present

## 2023-11-18 DIAGNOSIS — E782 Mixed hyperlipidemia: Secondary | ICD-10-CM | POA: Diagnosis not present

## 2023-11-18 DIAGNOSIS — Z2821 Immunization not carried out because of patient refusal: Secondary | ICD-10-CM

## 2023-11-18 DIAGNOSIS — Z5986 Financial insecurity: Secondary | ICD-10-CM | POA: Diagnosis not present

## 2023-11-18 DIAGNOSIS — F17200 Nicotine dependence, unspecified, uncomplicated: Secondary | ICD-10-CM

## 2023-11-18 DIAGNOSIS — Z853 Personal history of malignant neoplasm of breast: Secondary | ICD-10-CM | POA: Insufficient documentation

## 2023-11-18 DIAGNOSIS — C50412 Malignant neoplasm of upper-outer quadrant of left female breast: Secondary | ICD-10-CM

## 2023-11-18 DIAGNOSIS — Z5941 Food insecurity: Secondary | ICD-10-CM | POA: Insufficient documentation

## 2023-11-18 MED ORDER — ATORVASTATIN CALCIUM 10 MG PO TABS: 10.0000 mg | ORAL_TABLET | Freq: Every day | ORAL | 2 refills | Status: AC

## 2023-11-18 MED ORDER — AMLODIPINE BESYLATE 5 MG PO TABS: 5.0000 mg | ORAL_TABLET | Freq: Every day | ORAL | 2 refills | Status: AC

## 2023-11-18 NOTE — Progress Notes (Signed)
 Patient ID: Annette Richard, female    DOB: [birth year]  MRN: [id number]  CC: Hypertension (HTN f/u./No questions / concerns /No to flu vax)   Subjective: Annette Richard is a 68 y.o. female who presents for chronic ds management. Her concerns today include:  Patient with history of HTN, tob dep, anemia secondary to trauma, Vit D def.  LT breast CA ERP/PRP/Her2 - (lumpectomy 11/2021.  Plan for XRT and antiestrogen therapy), liver laceration/scalp laceration/left hip anterior dislocation with femoral neck and intertrochanteric fracture fall as a result of trauma 06/20/2021    Patient had surgery 11/04/2023 by Dr. Roda Shutters as a staged hardware removal from the left hip. Will need THR in 6-8 wks.  Ambulates with cane.  No falls.  She likes to walk Down 10 lbs in last yr.  HTN: Patient on amlodipine 5 mg daily.  Reports compliance with medication and low-salt diet. Eating more fruits. No chest pains, shortness of breath, lower extremity edema. HL: Reports compliance with taking atorvastatin 10 mg daily.  Tobacco dependence: When I last saw her we had her on Chantix. Did well with taking it just once a day but quit taking it because it was causing vomiting when increased to BID. Back to smoking 1 pk/day. Smoked since age 42. Would like to give trail of quitting again after she has THR. Yes to lung CA screen but wants to wait until fall.  History of left breast CA.  She remains on Arimidex to help prevent recurrence. Will see her breast CA surgeon and her MMG later this mth at [facility].  HM: She declines flu vaccine.  Will be due for Medicare wellness visit this month.  Patient Active Problem List   Diagnosis Date Noted   Painful orthopaedic hardware (HCC) 11/04/2023   Avascular necrosis of bone of hip, right (HCC) 09/05/2023   Osteopenia of both hips 10/18/2022   Genetic testing 11/13/2021   Family history of pancreatic cancer 10/18/2021   Family history of breast cancer 10/18/2021   Malignant  neoplasm of upper-outer quadrant of left breast in female, estrogen receptor positive (HCC) 10/16/2021   Mass of upper inner quadrant of left breast 09/14/2021   Tobacco dependence 09/14/2021   Normocytic anemia 09/14/2021   Vitamin D deficiency 09/14/2021   Aortic atherosclerosis (HCC) 09/14/2021   Anterior dislocation of left hip (HCC) 07/12/2021   Closed displaced fracture of greater trochanter of left femur (HCC) 07/12/2021   S/p left hip fracture 07/10/2021   Plantar fasciitis 02/19/2018   TIA (transient ischemic attack) 06/26/2016   Mixed hyperlipidemia 03/25/2015   Essential hypertension 03/24/2015   GERD (gastroesophageal reflux disease) 05/12/2011   Environmental allergies 05/12/2011     Current Outpatient Medications on File Prior to Visit  Medication Sig Dispense Refill   anastrozole (ARIMIDEX) 1 MG tablet TAKE 1 TABLET BY MOUTH EVERY DAY 90 tablet 1   aspirin (ASPIRIN 81) 81 MG chewable tablet Chew 1 tablet (81 mg total) by mouth 2 (two) times daily. To be taken after surgery to prevent blood clots 84 tablet 0   Calcium Carbonate-Vit D-Min (CALCIUM 1200) 1200-1000 MG-UNIT CHEW Chew 2 tablets by mouth in the morning.     HYDROcodone-acetaminophen (NORCO/VICODIN) 5-325 MG tablet Take 1 tablet by mouth 3 (three) times daily as needed for moderate pain (pain score 4-6). To be taken after surgery 30 tablet 0   ibuprofen (ADVIL) 200 MG tablet Take 800 mg by mouth every 8 (eight) hours as needed (pain.).  Multiple Vitamins-Iron (MULTIVITAMINS WITH IRON) TABS tablet Take 1 tablet by mouth daily.  0   ondansetron (ZOFRAN) 4 MG tablet Take 1 tablet (4 mg total) by mouth every 8 (eight) hours as needed for nausea or vomiting. 40 tablet 0   sennosides-docusate sodium (SENOKOT-S) 8.6-50 MG tablet Take 1 tablet by mouth every other day.     tiZANidine (ZANAFLEX) 2 MG tablet TAKE 1 TO 2 TABLETS (2-4 MG TOTAL) BY MOUTH AT BEDTIME AS NEEDED FOR MUSCLE SPASM 180 tablet 1   traMADol (ULTRAM)  50 MG tablet Take 1 tablet (50 mg total) by mouth every 12 (twelve) hours as needed. 30 tablet 0   Current Facility-Administered Medications on File Prior to Visit  Medication Dose Route Frequency Provider Last Rate Last Admin   triamcinolone acetonide (KENALOG) 10 MG/ML injection 10 mg  10 mg Other Once Vivi Barrack, DPM        No Known Allergies  Social History   Socioeconomic History   Marital status: Widowed    Spouse name: Not on file   Number of children: 2   Years of education: Not on file   Highest education level: Associate degree: occupational, Scientist, product/process development, or vocational program  Occupational History   Occupation: Retired Public house manager  Tobacco Use   Smoking status: Every Day    Current packs/day: 1.00    Average packs/day: 1 pack/day for 15.0 years (15.0 ttl pk-yrs)    Types: Cigarettes   Smokeless tobacco: Never   Tobacco comments:    Less than one pack of cigarettes as of 10/30/23  Vaping Use   Vaping status: Never Used  Substance and Sexual Activity   Alcohol use: Not Currently    Comment: Maybe once a year   Drug use: Never   Sexual activity: Never    Birth control/protection: Abstinence  Other Topics Concern   Not on file  Social History Narrative   ** Merged History Encounter **       Social Drivers of Health   Financial Resource Strain: High Risk (11/18/2023)   Overall Financial Resource Strain (CARDIA)    Difficulty of Paying Living Expenses: Very hard  Food Insecurity: Food Insecurity Present (11/18/2023)   Hunger Vital Sign    Worried About Running Out of Food in the Last Year: Often true    Ran Out of Food in the Last Year: Often true  Transportation Needs: Unmet Transportation Needs (11/18/2023)   PRAPARE - Transportation    Lack of Transportation (Medical): Yes    Lack of Transportation (Non-Medical): Yes  Physical Activity: Insufficiently Active (11/18/2023)   Exercise Vital Sign    Days of Exercise per Week: 2 days    Minutes of Exercise per  Session: 20 min  Stress: No Stress Concern Present (11/18/2023)   [employer] of Occupational Health - Occupational Stress Questionnaire    Feeling of Stress : Not at all  Social Connections: Socially Isolated (11/18/2023)   Social Connection and Isolation Panel [NHANES]    Frequency of Communication with Friends and Family: Once a week    Frequency of Social Gatherings with Friends and Family: More than three times a week    Attends Religious Services: Never    Database administrator or Organizations: No    Attends Banker Meetings: Never    Marital Status: Widowed  Intimate Partner Violence: Not At Risk (11/18/2023)   Humiliation, Afraid, Rape, and Kick questionnaire    Fear of Current or Ex-Partner: No  Emotionally Abused: No    Physically Abused: No    Sexually Abused: No    Family History  Problem Relation Age of Onset   Pancreatic cancer Mother 32   Mesothelioma Father 8   Breast cancer Sister 50       reports negative genetic testing   Colon polyps Sister        precancerous   Breast cancer Sister        dx. 30s   Pulmonary fibrosis Brother    CVA Maternal Grandfather    Breast cancer Maternal Great-grandmother     Past Surgical History:  Procedure Laterality Date   ABDOMINAL HYSTERECTOMY     APPENDECTOMY     removed with right salpingoophorectomy   BREAST LUMPECTOMY WITH RADIOACTIVE SEED AND SENTINEL LYMPH NODE BIOPSY Left 11/16/2021   Procedure: LEFT BREAST LUMPECTOMY WITH RADIOACTIVE SEED AND SENTINEL LYMPH NODE BIOPSY;  Surgeon: Harriette Bouillon, MD;  Location: [facility];  Service: General;  Laterality: Left;   CATARACT EXTRACTION W/ INTRAOCULAR LENS IMPLANT Bilateral 2024   CHOLECYSTECTOMY     2000s   FACIAL LACERATION REPAIR N/A 07/10/2021   Procedure: FACIAL LACERATION REPAIR;  Surgeon: Diamantina Monks, MD;  Location: [facility];  Service: General;  Laterality: N/A;   HARDWARE REMOVAL Left 11/04/2023   Procedure: LEFT HIP  HARDWARE REMOVAL, BONEGRAFTING;  Surgeon: Tarry Kos, MD;  Location: [facility];  Service: Orthopedics;  Laterality: Left;   HIP CLOSED REDUCTION Left 07/10/2021   Procedure: ATTEMPTED CLOSED REDUCTION HIP, OPEN REDUCTION LEFT HIP;  Surgeon: Roby Lofts, MD;  Location: [facility];  Service: Orthopedics;  Laterality: Left;   KNEE ARTHROSCOPY WITH MENISCAL REPAIR Right    SALPINGECTOMY Left    Ectopic Pregnancy   SALPINGOOPHORECTOMY Right     ROS: Review of Systems Negative except as stated above  PHYSICAL EXAM: BP 127/81 (BP Location: Left Arm, Patient Position: Sitting, Cuff Size: Normal)   Pulse 87   Temp 98.1 F (36.7 C) (Oral)   Ht 5\' 6"  (1.676 m)   Wt 164 lb (74.4 kg)   SpO2 97%   BMI 26.47 kg/m   Wt Readings from Last 3 Encounters:  11/18/23 164 lb (74.4 kg)  11/04/23 165 lb (74.8 kg)  10/30/23 165 lb 14.4 oz (75.3 kg)    Physical Exam  General appearance - alert, well appearing, older caucasian female and in no distress Mental status - normal mood, behavior, speech, dress, motor activity, and thought processes Neck - supple, no significant adenopathy Chest - clear to auscultation, no wheezes, rales or rhonchi, symmetric air entry Heart - normal rate, regular rhythm, normal S1, S2, no murmurs, rubs, clicks or gallops Musculoskeletal - pt has cane with her Extremities - peripheral pulses normal, no pedal edema, no clubbing or cyanosis      Latest Ref Rng & Units 10/30/2023   10:23 AM 11/29/2022    1:17 PM 05/31/2022   10:39 AM  CMP  Glucose 70 - 99 mg/dL 324  401  95   BUN 8 - 23 mg/dL 11  10  10    Creatinine 0.44 - 1.00 mg/dL 0.27  2.53  6.64   Sodium 135 - 145 mmol/L 140  141  141   Potassium 3.5 - 5.1 mmol/L 3.4  3.7  4.3   Chloride 98 - 111 mmol/L 101  107  106   CO2 22 - 32 mmol/L 24  27  30    Calcium 8.9 - 10.3 mg/dL 10.3  9.8  10.3   Total Protein 6.5 - 8.1 g/dL  7.7  7.5   Total Bilirubin 0.3 - 1.2 mg/dL  0.4  0.2   Alkaline Phos 38 - 126 U/L  102  101    AST 15 - 41 U/L  16  15   ALT 0 - 44 U/L  11  11    Lipid Panel     Component Value Date/Time   CHOL 254 (H) 09/14/2021 1042   TRIG 176 (H) 09/14/2021 1042   HDL 48 09/14/2021 1042   CHOLHDL 5.3 (H) 09/14/2021 1042   CHOLHDL 4.9 06/27/2016 0522   VLDL 23 06/27/2016 0522   LDLCALC 173 (H) 09/14/2021 1042    CBC    Component Value Date/Time   WBC 7.3 10/30/2023 1023   RBC 4.84 10/30/2023 1023   HGB 13.8 10/30/2023 1023   HGB 14.6 11/29/2022 1317   HGB 13.9 09/14/2021 1042   HCT 42.3 10/30/2023 1023   HCT 42.9 09/14/2021 1042   PLT 266 10/30/2023 1023   PLT 268 11/29/2022 1317   PLT 354 09/14/2021 1042   MCV 87.4 10/30/2023 1023   MCV 88 09/14/2021 1042   MCV 87 02/14/2014 0359   MCH 28.5 10/30/2023 1023   MCHC 32.6 10/30/2023 1023   RDW 12.6 10/30/2023 1023   RDW 13.3 09/14/2021 1042   RDW 13.5 02/14/2014 0359   LYMPHSABS 2.0 11/29/2022 1317   LYMPHSABS 1.3 02/14/2014 0359   MONOABS 0.6 11/29/2022 1317   MONOABS 0.5 02/14/2014 0359   EOSABS 0.2 11/29/2022 1317   EOSABS 0.1 02/14/2014 0359   BASOSABS 0.1 11/29/2022 1317   BASOSABS 0.0 02/14/2014 0359    ASSESSMENT AND PLAN: 1. Essential hypertension (Primary) At goal.  Continue amlodipine 5 mg daily. - Comprehensive metabolic panel - amLODipine (NORVASC) 5 MG tablet; Take 1 tablet (5 mg total) by mouth daily.  Dispense: 90 tablet; Refill: 2  2. Mixed hyperlipidemia Continue atorvastatin 10 mg daily.  Due for recheck of lipid profile today. - Lipid panel - atorvastatin (LIPITOR) 10 MG tablet; Take 1 tablet (10 mg total) by mouth daily.  Dispense: 90 tablet; Refill: 2  3. Tobacco dependence Advised to quit.  Patient states that she will give a trial in the future once she has completed hip replacement surgery.  She meets criteria for lung cancer screening but wants to defer on that as well until the fall.  4. Malignant neoplasm of upper-outer quadrant of left breast in female, estrogen receptor positive  (HCC) She remains on Arimidex to prevent prevent recurrence.  5. Influenza vaccination declined    Patient was given the opportunity to ask questions.  Patient verbalized understanding of the plan and was able to repeat key elements of the plan.   This documentation was completed using Paediatric nurse.  Any transcriptional errors are unintentional.  Orders Placed This Encounter  Procedures   Comprehensive metabolic panel   Lipid panel     Requested Prescriptions   Signed Prescriptions Disp Refills   amLODipine (NORVASC) 5 MG tablet 90 tablet 2    Sig: Take 1 tablet (5 mg total) by mouth daily.   atorvastatin (LIPITOR) 10 MG tablet 90 tablet 2    Sig: Take 1 tablet (10 mg total) by mouth daily.    Return in about 4 months (around 03/19/2024) for Medicare Wellness Visit in  2-3  wks with CMA or LPN.  Jonah Blue, MD, FACP

## 2023-11-19 ENCOUNTER — Encounter: Payer: Self-pay | Admitting: Internal Medicine

## 2023-11-19 ENCOUNTER — Ambulatory Visit (INDEPENDENT_AMBULATORY_CARE_PROVIDER_SITE_OTHER): Payer: 59 | Admitting: Physician Assistant

## 2023-11-19 ENCOUNTER — Other Ambulatory Visit (INDEPENDENT_AMBULATORY_CARE_PROVIDER_SITE_OTHER): Payer: Self-pay

## 2023-11-19 DIAGNOSIS — M25552 Pain in left hip: Secondary | ICD-10-CM

## 2023-11-19 LAB — COMPREHENSIVE METABOLIC PANEL
ALT: 10 IU/L (ref 0–32)
AST: 14 IU/L (ref 0–40)
Albumin: 4.2 g/dL (ref 3.9–4.9)
Alkaline Phosphatase: 136 IU/L — ABNORMAL HIGH (ref 44–121)
BUN/Creatinine Ratio: 10 — ABNORMAL LOW (ref 12–28)
BUN: 9 mg/dL (ref 8–27)
Bilirubin Total: 0.3 mg/dL (ref 0.0–1.2)
CO2: 23 mmol/L (ref 20–29)
Calcium: 9.7 mg/dL (ref 8.7–10.3)
Chloride: 105 mmol/L (ref 96–106)
Creatinine, Ser: 0.86 mg/dL (ref 0.57–1.00)
Globulin, Total: 2.7 g/dL (ref 1.5–4.5)
Glucose: 86 mg/dL (ref 70–99)
Potassium: 4.4 mmol/L (ref 3.5–5.2)
Sodium: 143 mmol/L (ref 134–144)
Total Protein: 6.9 g/dL (ref 6.0–8.5)
eGFR: 74 mL/min/{1.73_m2} (ref >59)

## 2023-11-19 LAB — LIPID PANEL
Chol/HDL Ratio: 3.3 ratio (ref 0.0–4.4)
Cholesterol, Total: 166 mg/dL (ref 100–199)
HDL: 50 mg/dL (ref >39)
LDL Chol Calc (NIH): 92 mg/dL (ref 0–99)
Triglycerides: 136 mg/dL (ref 0–149)
VLDL Cholesterol Cal: 24 mg/dL (ref 5–40)

## 2023-11-19 NOTE — Progress Notes (Signed)
 She has been doing  Post-Op Visit Note   Patient: Annette Richard           Date of Birth: 1956-03-26           MRN: [id number] Visit Date: 11/19/2023 PCP: Marcine Matar, MD   Assessment & Plan:  Chief Complaint:  Chief Complaint  Patient presents with   Left Hip - Routine Post Op   Visit Diagnoses:  1. Pain in left hip     Plan: Patient is a pleasant 68 year old female who comes in today 2 weeks status post left hip hardware removal.  She has been doing well.  Taking occasional tramadol for pain.  She has been taking baby aspirin twice daily for DVT prophylaxis.  She is no longer having the pain down her thigh she was having prior to surgery.  Examination of her left hip reveals well-healing surgical incisions with nylon sutures in place.  No evidence of infection or cellulitis.  Calf is soft nontender.  She is neurovascularly intact distally.  Today, sutures were removed and Steri-Strips applied.  She will continue with her baby aspirin for DVT prophylaxis.  She will follow-up in 4 weeks for repeat evaluation and to discuss total hip arthroplasty.  Follow-Up Instructions: Return in about 4 weeks (around 12/17/2023) for with Dr. Roda Shutters to discuss THA.   Orders:  Orders Placed This Encounter  Procedures   XR HIP UNILAT W OR W/O PELVIS 2-3 VIEWS LEFT   No orders of the defined types were placed in this encounter.   Imaging: XR HIP UNILAT W OR W/O PELVIS 2-3 VIEWS LEFT Result Date: 11/19/2023 X-rays show no acute findings.   PMFS History: Patient Active Problem List   Diagnosis Date Noted   Painful orthopaedic hardware (HCC) 11/04/2023   Avascular necrosis of bone of hip, right (HCC) 09/05/2023   Osteopenia of both hips 10/18/2022   Genetic testing 11/13/2021   Family history of pancreatic cancer 10/18/2021   Family history of breast cancer 10/18/2021   Malignant neoplasm of upper-outer quadrant of left breast in female, estrogen receptor positive (HCC) 10/16/2021   Mass  of upper inner quadrant of left breast 09/14/2021   Tobacco dependence 09/14/2021   Normocytic anemia 09/14/2021   Vitamin D deficiency 09/14/2021   Aortic atherosclerosis (HCC) 09/14/2021   Anterior dislocation of left hip (HCC) 07/12/2021   Closed displaced fracture of greater trochanter of left femur (HCC) 07/12/2021   S/p left hip fracture 07/10/2021   Plantar fasciitis 02/19/2018   TIA (transient ischemic attack) 06/26/2016   Mixed hyperlipidemia 03/25/2015   Essential hypertension 03/24/2015   GERD (gastroesophageal reflux disease) 05/12/2011   Environmental allergies 05/12/2011   Past Medical History:  Diagnosis Date   Arthritis    Breast cancer (HCC)    Left Breast   Hypertension    Osteopenia    Pneumonia    Stroke (HCC) 06/26/2016   TIA    Family History  Problem Relation Age of Onset   Pancreatic cancer Mother 86   Mesothelioma Father 68   Breast cancer Sister 52       reports negative genetic testing   Colon polyps Sister        precancerous   Breast cancer Sister        dx. 30s   Pulmonary fibrosis Brother    CVA Maternal Grandfather    Breast cancer Maternal Great-grandmother     Past Surgical History:  Procedure Laterality Date   ABDOMINAL HYSTERECTOMY  APPENDECTOMY     removed with right salpingoophorectomy   BREAST LUMPECTOMY WITH RADIOACTIVE SEED AND SENTINEL LYMPH NODE BIOPSY Left 11/16/2021   Procedure: LEFT BREAST LUMPECTOMY WITH RADIOACTIVE SEED AND SENTINEL LYMPH NODE BIOPSY;  Surgeon: Harriette Bouillon, MD;  Location: [facility];  Service: General;  Laterality: Left;   CATARACT EXTRACTION W/ INTRAOCULAR LENS IMPLANT Bilateral 2024   CHOLECYSTECTOMY     2000s   FACIAL LACERATION REPAIR N/A 07/10/2021   Procedure: FACIAL LACERATION REPAIR;  Surgeon: Diamantina Monks, MD;  Location: [facility];  Service: General;  Laterality: N/A;   HARDWARE REMOVAL Left 11/04/2023   Procedure: LEFT HIP HARDWARE REMOVAL, BONEGRAFTING;  Surgeon: Tarry Kos, MD;  Location: [facility];  Service: Orthopedics;  Laterality: Left;   HIP CLOSED REDUCTION Left 07/10/2021   Procedure: ATTEMPTED CLOSED REDUCTION HIP, OPEN REDUCTION LEFT HIP;  Surgeon: Roby Lofts, MD;  Location: [facility];  Service: Orthopedics;  Laterality: Left;   KNEE ARTHROSCOPY WITH MENISCAL REPAIR Right    SALPINGECTOMY Left    Ectopic Pregnancy   SALPINGOOPHORECTOMY Right    Social History   Occupational History   Occupation: Retired LPN  Tobacco Use   Smoking status: Every Day    Current packs/day: 1.00    Average packs/day: 1 pack/day for 15.0 years (15.0 ttl pk-yrs)    Types: Cigarettes   Smokeless tobacco: Never   Tobacco comments:    Less than one pack of cigarettes as of 10/30/23  Vaping Use   Vaping status: Never Used  Substance and Sexual Activity   Alcohol use: Not Currently    Comment: Maybe once a year   Drug use: Never   Sexual activity: Never    Birth control/protection: Abstinence

## 2023-12-01 ENCOUNTER — Other Ambulatory Visit: Payer: Self-pay | Admitting: Physician Assistant

## 2023-12-03 LAB — HM MAMMOGRAPHY

## 2023-12-04 ENCOUNTER — Encounter: Payer: Self-pay | Admitting: Internal Medicine

## 2023-12-17 ENCOUNTER — Other Ambulatory Visit (INDEPENDENT_AMBULATORY_CARE_PROVIDER_SITE_OTHER): Payer: Self-pay

## 2023-12-17 ENCOUNTER — Ambulatory Visit (INDEPENDENT_AMBULATORY_CARE_PROVIDER_SITE_OTHER): Admitting: Orthopaedic Surgery

## 2023-12-17 ENCOUNTER — Encounter: Payer: Self-pay | Admitting: Orthopaedic Surgery

## 2023-12-17 DIAGNOSIS — M25552 Pain in left hip: Secondary | ICD-10-CM

## 2023-12-17 NOTE — Progress Notes (Signed)
 Post-Op Visit Note   Patient: Annette Richard           Date of Birth: Jul 24, 1956           MRN: [id number] Visit Date: 12/17/2023 PCP: Marcine Matar, MD   Assessment & Plan:  Chief Complaint:  Chief Complaint  Patient presents with   Left Hip - Follow-up    Left hip hardware removal 11/04/2023   Visit Diagnoses:  1. Pain in left hip     Plan: Meggie returns today for 6-week postop check.  She is using a cane for ambulation.  She states that the hip pain is getting worse.  At this point we will place her on the schedule for a total hip arthroplasty.  I will need a CT scan for presurgical planning in case we need revision components.  Eunice Blase will call her to confirm surgery date.  Follow-Up Instructions: No follow-ups on file.   Orders:  Orders Placed This Encounter  Procedures   XR HIP UNILAT W OR W/O PELVIS 2-3 VIEWS LEFT   CT HIP LEFT WO CONTRAST   No orders of the defined types were placed in this encounter.   Imaging: XR HIP UNILAT W OR W/O PELVIS 2-3 VIEWS LEFT Result Date: 12/17/2023 X-rays of the left hip show prior hardware removal.  The femoral head is collapse consistent with avascular necrosis and secondary degenerative changes of the joint.  The bone tunnels appear to be filling in.   PMFS History: Patient Active Problem List   Diagnosis Date Noted   Painful orthopaedic hardware (HCC) 11/04/2023   Avascular necrosis of bone of hip, right (HCC) 09/05/2023   Osteopenia of both hips 10/18/2022   Genetic testing 11/13/2021   Family history of pancreatic cancer 10/18/2021   Family history of breast cancer 10/18/2021   Malignant neoplasm of upper-outer quadrant of left breast in female, estrogen receptor positive (HCC) 10/16/2021   Mass of upper inner quadrant of left breast 09/14/2021   Tobacco dependence 09/14/2021   Normocytic anemia 09/14/2021   Vitamin D deficiency 09/14/2021   Aortic atherosclerosis (HCC) 09/14/2021   Anterior dislocation of  left hip (HCC) 07/12/2021   Closed displaced fracture of greater trochanter of left femur (HCC) 07/12/2021   S/p left hip fracture 07/10/2021   Plantar fasciitis 02/19/2018   TIA (transient ischemic attack) 06/26/2016   Mixed hyperlipidemia 03/25/2015   Essential hypertension 03/24/2015   GERD (gastroesophageal reflux disease) 05/12/2011   Environmental allergies 05/12/2011   Past Medical History:  Diagnosis Date   Arthritis    Breast cancer (HCC)    Left Breast   Hypertension    Osteopenia    Pneumonia    Stroke (HCC) 06/26/2016   TIA    Family History  Problem Relation Age of Onset   Pancreatic cancer Mother 50   Mesothelioma Father 30   Breast cancer Sister 23       reports negative genetic testing   Colon polyps Sister        precancerous   Breast cancer Sister        dx. 30s   Pulmonary fibrosis Brother    CVA Maternal Grandfather    Breast cancer Maternal Great-grandmother     Past Surgical History:  Procedure Laterality Date   ABDOMINAL HYSTERECTOMY     APPENDECTOMY     removed with right salpingoophorectomy   BREAST LUMPECTOMY WITH RADIOACTIVE SEED AND SENTINEL LYMPH NODE BIOPSY Left 11/16/2021   Procedure: LEFT BREAST LUMPECTOMY  WITH RADIOACTIVE SEED AND SENTINEL LYMPH NODE BIOPSY;  Surgeon: Harriette Bouillon, MD;  Location: [facility];  Service: General;  Laterality: Left;   CATARACT EXTRACTION W/ INTRAOCULAR LENS IMPLANT Bilateral 2024   CHOLECYSTECTOMY     2000s   FACIAL LACERATION REPAIR N/A 07/10/2021   Procedure: FACIAL LACERATION REPAIR;  Surgeon: Diamantina Monks, MD;  Location: [facility];  Service: General;  Laterality: N/A;   HARDWARE REMOVAL Left 11/04/2023   Procedure: LEFT HIP HARDWARE REMOVAL, BONEGRAFTING;  Surgeon: Tarry Kos, MD;  Location: [facility];  Service: Orthopedics;  Laterality: Left;   HIP CLOSED REDUCTION Left 07/10/2021   Procedure: ATTEMPTED CLOSED REDUCTION HIP, OPEN REDUCTION LEFT HIP;  Surgeon: Roby Lofts, MD;   Location: [facility];  Service: Orthopedics;  Laterality: Left;   KNEE ARTHROSCOPY WITH MENISCAL REPAIR Right    SALPINGECTOMY Left    Ectopic Pregnancy   SALPINGOOPHORECTOMY Right    Social History   Occupational History   Occupation: Retired LPN  Tobacco Use   Smoking status: Every Day    Current packs/day: 1.00    Average packs/day: 1 pack/day for 15.0 years (15.0 ttl pk-yrs)    Types: Cigarettes   Smokeless tobacco: Never   Tobacco comments:    Less than one pack of cigarettes as of 10/30/23  Vaping Use   Vaping status: Never Used  Substance and Sexual Activity   Alcohol use: Not Currently    Comment: Maybe once a year   Drug use: Never   Sexual activity: Never    Birth control/protection: Abstinence

## 2023-12-25 ENCOUNTER — Ambulatory Visit
Admission: RE | Admit: 2023-12-25 | Discharge: 2023-12-25 | Disposition: A | Discharge status: Home / Self Care | Source: Ambulatory Visit | Attending: Orthopaedic Surgery | Admitting: Orthopaedic Surgery

## 2023-12-25 DIAGNOSIS — M25552 Pain in left hip: Secondary | ICD-10-CM

## 2023-12-30 ENCOUNTER — Ambulatory Visit: Payer: Medicare Other | Attending: Surgery

## 2023-12-30 VITALS — Wt 158.5 lb

## 2023-12-30 DIAGNOSIS — Z483 Aftercare following surgery for neoplasm: Secondary | ICD-10-CM | POA: Insufficient documentation
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
## 2024-01-01 ENCOUNTER — Other Ambulatory Visit: Payer: Self-pay | Admitting: Physician Assistant

## 2024-01-07 ENCOUNTER — Encounter: Payer: Self-pay | Admitting: Orthopaedic Surgery

## 2024-01-25 ENCOUNTER — Other Ambulatory Visit: Payer: Self-pay | Admitting: Family Medicine

## 2024-01-25 ENCOUNTER — Other Ambulatory Visit: Payer: Self-pay | Admitting: Hematology and Oncology

## 2024-01-26 ENCOUNTER — Other Ambulatory Visit: Payer: Self-pay | Admitting: Surgical

## 2024-01-27 ENCOUNTER — Encounter: Payer: Self-pay | Admitting: Orthopaedic Surgery

## 2024-01-27 NOTE — Telephone Encounter (Signed)
 Last OV 08/13/23 Next OV not scheduled  Last refill 07/23/23 Qty #180/1

## 2024-01-27 NOTE — Telephone Encounter (Signed)
Xu patient

## 2024-01-27 NOTE — Progress Notes (Signed)
 Surgical Instructions   Your procedure is scheduled on Thursday Feb 06, 2024. Report to [facility] Main Entrance "A" at 7:30 A.M., then check in with the Admitting office. Any questions or running late day of surgery: call [phone]  Questions prior to your surgery date: call [phone], Monday-Friday, 8am-4pm. If you experience any cold or flu symptoms such as cough, fever, chills, shortness of breath, etc. between now and your scheduled surgery, please notify us  at the above number.     Remember:  Do not eat after midnight the night before your surgery  You may drink clear liquids until 7:00 the morning of your surgery.   Clear liquids allowed are: Water, Non-Citrus Juices (without pulp), Carbonated Beverages, Clear Tea (no milk, honey, etc.), Black Coffee Only (NO MILK, CREAM OR POWDERED CREAMER of any kind), and Gatorade.  Patient Instructions  The night before surgery:  No food after midnight. ONLY clear liquids after midnight  The day of surgery (if you do NOT have diabetes):  Drink ONE (1) Pre-Surgery Clear Ensure by 7:00 the morning of surgery. Drink in one sitting. Do not sip.  This drink was given to you during your hospital  pre-op appointment visit.  Nothing else to drink after completing the  Pre-Surgery Clear Ensure.         If you have questions, please contact your surgeon's office.     Take these medicines the morning of surgery with A SIP OF WATER  amLODipine  (NORVASC )  atorvastatin  (LIPITOR)   May take these medicines IF NEEDED: ondansetron  (ZOFRAN )  traMADol  (ULTRAM )   One week prior to surgery, STOP taking any Aspirin  (unless otherwise instructed by your surgeon) Aleve, Naproxen, Ibuprofen , Motrin , Advil , Goody's, BC's, all herbal medications, fish oil, and non-prescription vitamins.                     Do NOT Smoke (Tobacco/Vaping) for 24 hours prior to your procedure.  If you use a CPAP at night, you may bring your mask/headgear for your  overnight stay.   You will be asked to remove any contacts, glasses, piercing's, hearing aid's, dentures/partials prior to surgery. Please bring cases for these items if needed.    Patients discharged the day of surgery will not be allowed to drive home, and someone needs to stay with them for 24 hours.  SURGICAL WAITING ROOM VISITATION Patients may have no more than 2 support people in the waiting area - these visitors may rotate.   Pre-op nurse will coordinate an appropriate time for 1 ADULT support person, who may not rotate, to accompany patient in pre-op.  Children under the age of 61 must have an adult with them who is not the patient and must remain in the main waiting area with an adult.  If the patient needs to stay at the hospital during part of their recovery, the visitor guidelines for inpatient rooms apply.  Please refer to the [facility] website for the visitor guidelines for any additional information.   If you received a COVID test during your pre-op visit  it is requested that you wear a mask when out in public, stay away from anyone that may not be feeling well and notify your surgeon if you develop symptoms. If you have been in contact with anyone that has tested positive in the last 10 days please notify you surgeon.      Pre-operative 5 CHG Bathing Instructions   You can play a key role in reducing  the risk of infection after surgery. Your skin needs to be as free of germs as possible. You can reduce the number of germs on your skin by washing with CHG (chlorhexidine  gluconate) soap before surgery. CHG is an antiseptic soap that kills germs and continues to kill germs even after washing.   DO NOT use if you have an allergy to chlorhexidine /CHG or antibacterial soaps. If your skin becomes reddened or irritated, stop using the CHG and notify one of our RNs at [phone].   Please shower with the CHG soap starting 4 days before surgery using the following schedule:      Please keep in mind the following:  DO NOT shave, including legs and underarms, starting the day of your first shower.   You may shave your face at any point before/day of surgery.  Place clean sheets on your bed the day you start using CHG soap. Use a clean washcloth (not used since being washed) for each shower. DO NOT sleep with pets once you start using the CHG.   CHG Shower Instructions:  Wash your face and private area with normal soap. If you choose to wash your hair, wash first with your normal shampoo.  After you use shampoo/soap, rinse your hair and body thoroughly to remove shampoo/soap residue.  Turn the water OFF and apply about 3 tablespoons (45 ml) of CHG soap to a CLEAN washcloth.  Apply CHG soap ONLY FROM YOUR NECK DOWN TO YOUR TOES (washing for 3-5 minutes)  DO NOT use CHG soap on face, private areas, open wounds, or sores.  Pay special attention to the area where your surgery is being performed.  If you are having back surgery, having someone wash your back for you may be helpful. Wait 2 minutes after CHG soap is applied, then you may rinse off the CHG soap.  Pat dry with a clean towel  Put on clean clothes/pajamas   If you choose to wear lotion, please use ONLY the CHG-compatible lotions that are listed below.  Additional instructions for the day of surgery: DO NOT APPLY any lotions, deodorants, cologne, or perfumes.   Do not bring valuables to the hospital. [facility] is not responsible for any belongings/valuables. Do not wear nail polish, gel polish, artificial nails, or any other type of covering on natural nails (fingers and toes) Do not wear jewelry or makeup Put on clean/comfortable clothes.  Please brush your teeth.  Ask your nurse before applying any prescription medications to the skin.     CHG Compatible Lotions   Aveeno Moisturizing lotion  Cetaphil Moisturizing Cream  Cetaphil Moisturizing Lotion  Clairol Herbal Essence Moisturizing  Lotion, Dry Skin  Clairol Herbal Essence Moisturizing Lotion, Extra Dry Skin  Clairol Herbal Essence Moisturizing Lotion, Normal Skin  Curel Age Defying Therapeutic Moisturizing Lotion with Alpha Hydroxy  Curel Extreme Care Body Lotion  Curel Soothing Hands Moisturizing Hand Lotion  Curel Therapeutic Moisturizing Cream, Fragrance-Free  Curel Therapeutic Moisturizing Lotion, Fragrance-Free  Curel Therapeutic Moisturizing Lotion, Original Formula  Eucerin Daily Replenishing Lotion  Eucerin Dry Skin Therapy Plus Alpha Hydroxy Crme  Eucerin Dry Skin Therapy Plus Alpha Hydroxy Lotion  Eucerin Original Crme  Eucerin Original Lotion  Eucerin Plus Crme Eucerin Plus Lotion  Eucerin TriLipid Replenishing Lotion  Keri Anti-Bacterial Hand Lotion  Keri Deep Conditioning Original Lotion Dry Skin Formula Softly Scented  Keri Deep Conditioning Original Lotion, Fragrance Free Sensitive Skin Formula  Keri Lotion Fast Absorbing Fragrance Free Sensitive Skin Formula  Keri Lotion  Fast Absorbing Softly Scented Dry Skin Formula  Keri Original Lotion  Keri Skin Renewal Lotion Keri Silky Smooth Lotion  Keri Silky Smooth Sensitive Skin Lotion  Nivea Body Creamy Conditioning Oil  Nivea Body Extra Enriched Lotion  Nivea Body Original Lotion  Nivea Body Sheer Moisturizing Lotion Nivea Crme  Nivea Skin Firming Lotion  NutraDerm 30 Skin Lotion  NutraDerm Skin Lotion  NutraDerm Therapeutic Skin Cream  NutraDerm Therapeutic Skin Lotion  ProShield Protective Hand Cream  Provon moisturizing lotion  Please read over the following fact sheets that you were given.

## 2024-01-28 ENCOUNTER — Encounter (HOSPITAL_COMMUNITY): Payer: Self-pay

## 2024-01-28 ENCOUNTER — Encounter (HOSPITAL_COMMUNITY)
Admission: RE | Admit: 2024-01-28 | Discharge: 2024-01-28 | Disposition: A | Discharge status: Home / Self Care | Source: Ambulatory Visit | Attending: Orthopaedic Surgery | Admitting: Orthopaedic Surgery

## 2024-01-28 ENCOUNTER — Other Ambulatory Visit: Payer: Self-pay | Admitting: Physician Assistant

## 2024-01-28 ENCOUNTER — Other Ambulatory Visit: Payer: Self-pay

## 2024-01-28 VITALS — BP 133/76 | HR 65 | Temp 97.8°F | Resp 18 | Ht 66.0 in | Wt 156.8 lb

## 2024-01-28 DIAGNOSIS — Z01812 Encounter for preprocedural laboratory examination: Secondary | ICD-10-CM | POA: Diagnosis present

## 2024-01-28 DIAGNOSIS — M87052 Idiopathic aseptic necrosis of left femur: Secondary | ICD-10-CM | POA: Diagnosis not present

## 2024-01-28 DIAGNOSIS — Z01818 Encounter for other preprocedural examination: Secondary | ICD-10-CM

## 2024-01-28 HISTORY — DX: Other specified postprocedural states: Z98.890

## 2024-01-28 HISTORY — DX: Hyperlipidemia, unspecified: E78.5

## 2024-01-28 LAB — CBC
HCT: 42.7 % (ref 36.0–46.0)
Hemoglobin: 14.2 g/dL (ref 12.0–15.0)
MCH: 28.5 pg (ref 26.0–34.0)
MCHC: 33.3 g/dL (ref 30.0–36.0)
MCV: 85.6 fL (ref 80.0–100.0)
Platelets: 259 10*3/uL (ref 150–400)
RBC: 4.99 MIL/uL (ref 3.87–5.11)
RDW: 13.4 % (ref 11.5–15.5)
WBC: 7.2 10*3/uL (ref 4.0–10.5)
nRBC: 0 % (ref 0.0–0.2)

## 2024-01-28 LAB — BASIC METABOLIC PANEL WITH GFR
Anion gap: 9 (ref 5–15)
BUN: 7 mg/dL — ABNORMAL LOW (ref 8–23)
CO2: 24 mmol/L (ref 22–32)
Calcium: 9.5 mg/dL (ref 8.9–10.3)
Chloride: 106 mmol/L (ref 98–111)
Creatinine, Ser: 0.9 mg/dL (ref 0.44–1.00)
GFR, Estimated: >60 mL/min (ref >60)
Glucose, Bld: 108 mg/dL — ABNORMAL HIGH (ref 70–99)
Potassium: 3.3 mmol/L — ABNORMAL LOW (ref 3.5–5.1)
Sodium: 139 mmol/L (ref 135–145)

## 2024-01-28 LAB — SURGICAL PCR SCREEN
MRSA, PCR: POSITIVE — AB
Staphylococcus aureus: POSITIVE — AB

## 2024-01-28 LAB — TYPE AND SCREEN
ABO/RH(D): O POS
Antibody Screen: NEGATIVE

## 2024-01-28 NOTE — Progress Notes (Signed)
  PCP - Dr. Concetta Dee Cardiologist - Denies   PPM/ICD - Denies    Chest x-ray - n/a EKG - 10/30/2023 Stress Test - Denies ECHO - 06/26/2016 Cardiac Cath - Denies   Sleep Study - Denies CPAP - n/a   No DM   Last dose of GLP1 agonist- n/a GLP1 instructions: n/a   Blood Thinner Instructions: n/a Aspirin  Instructions: patient states that she is currently not taking Aspirin .    ERAS Protcol - Clear liquids until 0700 morning of surgery PRE-SURGERY Ensure or G2- Ensure given to pt with instructions   COVID TEST- n/a     Anesthesia review: No.   Patient denies shortness of breath, fever, cough and chest pain at PAT appointment. Pt denies any respiratory illness/infection in the last two months     All instructions explained to the patient, with a verbal understanding of the material. Patient agrees to go over the instructions while at home for a better understanding. Patient also instructed to self quarantine after being tested for COVID-19. The opportunity to ask questions was provided.

## 2024-01-28 NOTE — Progress Notes (Signed)
 Surgical PCR resulted positive for MRSA- left voicemail at Dr. Mollie Anger office.

## 2024-01-28 NOTE — Telephone Encounter (Signed)
 Looks like she has a history of cva and breast cancer which increases risk of po dvt.  We will put her on eliquis  instead of asa post-op for this reason.  We do not rx vicodin post-op.  If she is not on any benzos like vicodin at baseline, we will write a muscle relaxer post-op intsead

## 2024-01-29 ENCOUNTER — Telehealth: Payer: Self-pay | Admitting: Orthopaedic Surgery

## 2024-01-29 NOTE — Telephone Encounter (Signed)
 Preadmission testing calling to say this patient's surgical PCR came back positive for MRSA.  Patient is scheduled for left total hip 02-06-24.

## 2024-01-29 NOTE — Telephone Encounter (Signed)
 Called and notified patient about eliquis  and vicodin. She has a script for Tizanidine , but not currently taking it. Is she staying overnight?

## 2024-01-30 ENCOUNTER — Other Ambulatory Visit: Payer: Self-pay | Admitting: Physician Assistant

## 2024-01-30 MED ORDER — OXYCODONE-ACETAMINOPHEN 5-325 MG PO TABS
1.0000 | ORAL_TABLET | Freq: Four times a day (QID) | ORAL | 0 refills | Status: DC | PRN
Start: 2024-01-30 — End: 2024-07-20

## 2024-01-30 MED ORDER — DOXYCYCLINE HYCLATE 100 MG PO CAPS: 100.0000 mg | ORAL_CAPSULE | Freq: Two times a day (BID) | ORAL | 0 refills | Status: DC

## 2024-01-30 MED ORDER — ONDANSETRON HCL 4 MG PO TABS: 4.0000 mg | ORAL_TABLET | Freq: Three times a day (TID) | ORAL | 0 refills | Status: DC | PRN

## 2024-01-30 MED ORDER — DOCUSATE SODIUM 100 MG PO CAPS: 100.0000 mg | ORAL_CAPSULE | Freq: Every day | ORAL | 2 refills | Status: AC | PRN

## 2024-01-30 NOTE — Telephone Encounter (Signed)
 Thank you.  Yes to overnight

## 2024-02-05 ENCOUNTER — Telehealth: Payer: Self-pay | Admitting: *Deleted

## 2024-02-05 MED ORDER — TRANEXAMIC ACID 1000 MG/10ML IV SOLN
2000.0000 mg | INTRAVENOUS | Status: AC
  Filled 2024-02-05: qty 20

## 2024-02-05 NOTE — Telephone Encounter (Signed)
 [facility] RNCM pre-op  call completed.

## 2024-02-05 NOTE — Care Plan (Signed)
 [facility] RNCM call to patient to discuss her upcoming Left total hip arthroplasty with Dr. Christiane Cowing on 02/06/24 at [facility]. She is agreeable to case management. She lives with her daughter and son in law and their children. She will have assistance at home after discharge. She has a RW already. Anticipate HHPT will be needed after a short hospital stay. Referral made to [facility] after choice provided. Reviewed post op care instructions. Will continue to follow for needs.

## 2024-02-06 ENCOUNTER — Ambulatory Visit (HOSPITAL_COMMUNITY)

## 2024-02-06 ENCOUNTER — Observation Stay (HOSPITAL_COMMUNITY)

## 2024-02-06 ENCOUNTER — Observation Stay (HOSPITAL_COMMUNITY)
Admission: RE | Admit: 2024-02-06 | Discharge: 2024-02-07 | Disposition: A | Discharge status: Home / Self Care | Source: Ambulatory Visit | Attending: Orthopaedic Surgery | Admitting: Orthopaedic Surgery

## 2024-02-06 ENCOUNTER — Encounter (HOSPITAL_COMMUNITY)
Admission: RE | Disposition: A | Discharge status: Home / Self Care | Payer: Self-pay | Source: Ambulatory Visit | Attending: Orthopaedic Surgery

## 2024-02-06 ENCOUNTER — Other Ambulatory Visit: Payer: Self-pay

## 2024-02-06 DIAGNOSIS — M87052 Idiopathic aseptic necrosis of left femur: Secondary | ICD-10-CM | POA: Diagnosis not present

## 2024-02-06 DIAGNOSIS — M1652 Unilateral post-traumatic osteoarthritis, left hip: Secondary | ICD-10-CM

## 2024-02-06 DIAGNOSIS — Z853 Personal history of malignant neoplasm of breast: Secondary | ICD-10-CM | POA: Insufficient documentation

## 2024-02-06 DIAGNOSIS — Z96642 Presence of left artificial hip joint: Secondary | ICD-10-CM

## 2024-02-06 DIAGNOSIS — Z79899 Other long term (current) drug therapy: Secondary | ICD-10-CM | POA: Diagnosis not present

## 2024-02-06 DIAGNOSIS — I509 Heart failure, unspecified: Secondary | ICD-10-CM | POA: Diagnosis not present

## 2024-02-06 DIAGNOSIS — M167 Other unilateral secondary osteoarthritis of hip: Secondary | ICD-10-CM | POA: Diagnosis not present

## 2024-02-06 DIAGNOSIS — Z7982 Long term (current) use of aspirin: Secondary | ICD-10-CM | POA: Insufficient documentation

## 2024-02-06 DIAGNOSIS — M87852 Other osteonecrosis, left femur: Secondary | ICD-10-CM | POA: Diagnosis present

## 2024-02-06 DIAGNOSIS — I110 Hypertensive heart disease with heart failure: Secondary | ICD-10-CM

## 2024-02-06 DIAGNOSIS — Z8673 Personal history of transient ischemic attack (TIA), and cerebral infarction without residual deficits: Secondary | ICD-10-CM | POA: Insufficient documentation

## 2024-02-06 DIAGNOSIS — I10 Essential (primary) hypertension: Secondary | ICD-10-CM | POA: Insufficient documentation

## 2024-02-06 DIAGNOSIS — F17210 Nicotine dependence, cigarettes, uncomplicated: Secondary | ICD-10-CM | POA: Diagnosis not present

## 2024-02-06 HISTORY — PX: TOTAL HIP ARTHROPLASTY: SHX124

## 2024-02-06 LAB — ABO/RH: ABO/RH(D): O POS

## 2024-02-06 SURGERY — ARTHROPLASTY, HIP, TOTAL, ANTERIOR APPROACH
Anesthesia: Monitor Anesthesia Care | Site: Hip | Laterality: Left

## 2024-02-06 MED ORDER — ONDANSETRON HCL 4 MG/2ML IJ SOLN
INTRAMUSCULAR | Status: AC
  Filled 2024-02-06: qty 2

## 2024-02-06 MED ORDER — BUPIVACAINE-MELOXICAM ER 400-12 MG/14ML IJ SOLN
INTRAMUSCULAR | Status: DC | PRN
  Administered 2024-02-06: 14 mL

## 2024-02-06 MED ORDER — SORBITOL 70 % SOLN: 30.0000 mL | Freq: Every day | Status: DC | PRN

## 2024-02-06 MED ORDER — AMLODIPINE BESYLATE 5 MG PO TABS
5.0000 mg | ORAL_TABLET | Freq: Every day | ORAL | Status: DC
  Administered 2024-02-07: 5 mg via ORAL
  Filled 2024-02-06: qty 1

## 2024-02-06 MED ORDER — PHENYLEPHRINE 80 MCG/ML (10ML) SYRINGE FOR IV PUSH (FOR BLOOD PRESSURE SUPPORT)
PREFILLED_SYRINGE | INTRAVENOUS | Status: DC | PRN
  Administered 2024-02-06: 160 ug via INTRAVENOUS
  Administered 2024-02-06: 80 ug via INTRAVENOUS
  Administered 2024-02-06 (×2): 160 ug via INTRAVENOUS
  Administered 2024-02-06 (×2): 80 ug via INTRAVENOUS

## 2024-02-06 MED ORDER — CEFAZOLIN SODIUM-DEXTROSE 2-4 GM/100ML-% IV SOLN
2.0000 g | Freq: Four times a day (QID) | INTRAVENOUS | Status: AC
  Administered 2024-02-06 (×2): 2 g via INTRAVENOUS
  Filled 2024-02-06 (×2): qty 100

## 2024-02-06 MED ORDER — PHENYLEPHRINE HCL-NACL 20-0.9 MG/250ML-% IV SOLN
INTRAVENOUS | Status: DC | PRN
  Administered 2024-02-06: 40 ug/min via INTRAVENOUS

## 2024-02-06 MED ORDER — LACTATED RINGERS IV BOLUS: 250.0000 mL | Freq: Once | INTRAVENOUS | Status: AC

## 2024-02-06 MED ORDER — FENTANYL CITRATE (PF) 250 MCG/5ML IJ SOLN
INTRAMUSCULAR | Status: AC
  Filled 2024-02-06: qty 5

## 2024-02-06 MED ORDER — METOCLOPRAMIDE HCL 5 MG PO TABS: 5.0000 mg | ORAL_TABLET | Freq: Three times a day (TID) | ORAL | Status: DC | PRN

## 2024-02-06 MED ORDER — HYDROMORPHONE HCL 1 MG/ML IJ SOLN
0.2500 mg | INTRAMUSCULAR | Status: DC | PRN
  Administered 2024-02-06 (×2): 0.5 mg via INTRAVENOUS

## 2024-02-06 MED ORDER — HYDROMORPHONE HCL 1 MG/ML IJ SOLN
INTRAMUSCULAR | Status: AC
  Filled 2024-02-06: qty 1

## 2024-02-06 MED ORDER — VANCOMYCIN HCL 1 G IV SOLR
INTRAVENOUS | Status: DC | PRN
  Administered 2024-02-06: 1000 mg

## 2024-02-06 MED ORDER — ONDANSETRON HCL 4 MG/2ML IJ SOLN: 4.0000 mg | Freq: Four times a day (QID) | INTRAMUSCULAR | Status: DC | PRN

## 2024-02-06 MED ORDER — PROPOFOL 10 MG/ML IV BOLUS
INTRAVENOUS | Status: AC
  Filled 2024-02-06: qty 20

## 2024-02-06 MED ORDER — EPHEDRINE 5 MG/ML INJ
INTRAVENOUS | Status: AC
  Filled 2024-02-06: qty 5

## 2024-02-06 MED ORDER — OXYCODONE HCL 5 MG PO TABS
ORAL_TABLET | ORAL | Status: AC
  Filled 2024-02-06: qty 1

## 2024-02-06 MED ORDER — OXYCODONE HCL 5 MG/5ML PO SOLN: 5.0000 mg | Freq: Once | ORAL | Status: AC | PRN

## 2024-02-06 MED ORDER — MAGNESIUM CITRATE PO SOLN: 1.0000 | Freq: Once | ORAL | Status: DC | PRN

## 2024-02-06 MED ORDER — PRONTOSAN WOUND IRRIGATION OPTIME
TOPICAL | Status: DC | PRN
  Administered 2024-02-06: 350 mL via TOPICAL

## 2024-02-06 MED ORDER — FENTANYL CITRATE (PF) 250 MCG/5ML IJ SOLN
INTRAMUSCULAR | Status: DC | PRN
  Administered 2024-02-06: 25 ug via INTRAVENOUS
  Administered 2024-02-06: 50 ug via INTRAVENOUS

## 2024-02-06 MED ORDER — ACETAMINOPHEN 500 MG PO TABS
1000.0000 mg | ORAL_TABLET | Freq: Four times a day (QID) | ORAL | Status: AC
  Administered 2024-02-06 – 2024-02-07 (×4): 1000 mg via ORAL
  Filled 2024-02-06 (×4): qty 2

## 2024-02-06 MED ORDER — MUPIROCIN 2 % EX OINT: 1.0000 | TOPICAL_OINTMENT | Freq: Two times a day (BID) | CUTANEOUS | 0 refills | Status: AC

## 2024-02-06 MED ORDER — VANCOMYCIN HCL 1000 MG IV SOLR
INTRAVENOUS | Status: AC
  Filled 2024-02-06: qty 20

## 2024-02-06 MED ORDER — CHLORHEXIDINE GLUCONATE 0.12 % MT SOLN
15.0000 mL | Freq: Once | OROMUCOSAL | Status: AC
  Administered 2024-02-06: 15 mL via OROMUCOSAL
  Filled 2024-02-06: qty 15

## 2024-02-06 MED ORDER — AMISULPRIDE (ANTIEMETIC) 5 MG/2ML IV SOLN: 10.0000 mg | Freq: Once | INTRAVENOUS | Status: DC | PRN

## 2024-02-06 MED ORDER — POLYETHYLENE GLYCOL 3350 17 G PO PACK: 17.0000 g | PACK | Freq: Every day | ORAL | Status: DC

## 2024-02-06 MED ORDER — ONDANSETRON HCL 4 MG/2ML IJ SOLN: 4.0000 mg | Freq: Once | INTRAMUSCULAR | Status: DC | PRN

## 2024-02-06 MED ORDER — CEFAZOLIN SODIUM-DEXTROSE 2-4 GM/100ML-% IV SOLN
2.0000 g | INTRAVENOUS | Status: AC
  Administered 2024-02-06: 2 g via INTRAVENOUS
  Filled 2024-02-06: qty 100

## 2024-02-06 MED ORDER — MIDAZOLAM HCL 2 MG/2ML IJ SOLN
INTRAMUSCULAR | Status: AC
  Filled 2024-02-06: qty 2

## 2024-02-06 MED ORDER — POVIDONE-IODINE 10 % EX SWAB
2.0000 | Freq: Once | CUTANEOUS | Status: AC
  Administered 2024-02-06: 2 via TOPICAL

## 2024-02-06 MED ORDER — DEXAMETHASONE SODIUM PHOSPHATE 10 MG/ML IJ SOLN
INTRAMUSCULAR | Status: DC | PRN
  Administered 2024-02-06: 10 mg via INTRAVENOUS

## 2024-02-06 MED ORDER — ONDANSETRON HCL 4 MG PO TABS
4.0000 mg | ORAL_TABLET | Freq: Four times a day (QID) | ORAL | Status: DC | PRN
  Administered 2024-02-07: 4 mg via ORAL
  Filled 2024-02-06: qty 1

## 2024-02-06 MED ORDER — PANTOPRAZOLE SODIUM 40 MG PO TBEC
40.0000 mg | DELAYED_RELEASE_TABLET | Freq: Every day | ORAL | Status: DC
  Administered 2024-02-06 – 2024-02-07 (×2): 40 mg via ORAL
  Filled 2024-02-06 (×2): qty 1

## 2024-02-06 MED ORDER — DEXAMETHASONE SODIUM PHOSPHATE 10 MG/ML IJ SOLN
10.0000 mg | Freq: Once | INTRAMUSCULAR | Status: AC
  Administered 2024-02-07: 10 mg via INTRAVENOUS
  Filled 2024-02-06: qty 1

## 2024-02-06 MED ORDER — KETOROLAC TROMETHAMINE 15 MG/ML IJ SOLN
INTRAMUSCULAR | Status: AC
  Filled 2024-02-06: qty 1

## 2024-02-06 MED ORDER — ACETAMINOPHEN 500 MG PO TABS
1000.0000 mg | ORAL_TABLET | Freq: Once | ORAL | Status: AC
  Administered 2024-02-06: 1000 mg via ORAL
  Filled 2024-02-06: qty 2

## 2024-02-06 MED ORDER — BUPIVACAINE IN DEXTROSE 0.75-8.25 % IT SOLN
INTRATHECAL | Status: DC | PRN
  Administered 2024-02-06: 1.6 mL via INTRATHECAL

## 2024-02-06 MED ORDER — ORAL CARE MOUTH RINSE: 15.0000 mL | Freq: Once | OROMUCOSAL | Status: AC

## 2024-02-06 MED ORDER — METOCLOPRAMIDE HCL 5 MG/ML IJ SOLN: 5.0000 mg | Freq: Three times a day (TID) | INTRAMUSCULAR | Status: DC | PRN

## 2024-02-06 MED ORDER — HYDROMORPHONE HCL 1 MG/ML IJ SOLN: 0.5000 mg | INTRAMUSCULAR | Status: DC | PRN

## 2024-02-06 MED ORDER — PROPOFOL 500 MG/50ML IV EMUL
INTRAVENOUS | Status: DC | PRN
  Administered 2024-02-06: 70 ug/kg/min via INTRAVENOUS

## 2024-02-06 MED ORDER — OXYCODONE HCL 5 MG PO TABS
5.0000 mg | ORAL_TABLET | Freq: Once | ORAL | Status: AC | PRN
  Administered 2024-02-06: 5 mg via ORAL

## 2024-02-06 MED ORDER — TRANEXAMIC ACID 1000 MG/10ML IV SOLN
INTRAVENOUS | Status: DC | PRN
  Administered 2024-02-06: 2000 mg via TOPICAL

## 2024-02-06 MED ORDER — PROPOFOL 10 MG/ML IV BOLUS
INTRAVENOUS | Status: DC | PRN
  Administered 2024-02-06: 30 mg via INTRAVENOUS
  Administered 2024-02-06: 20 mg via INTRAVENOUS

## 2024-02-06 MED ORDER — KETOROLAC TROMETHAMINE 30 MG/ML IJ SOLN
15.0000 mg | Freq: Once | INTRAMUSCULAR | Status: AC | PRN
  Administered 2024-02-06: 15 mg via INTRAVENOUS

## 2024-02-06 MED ORDER — OXYCODONE HCL 5 MG PO TABS
5.0000 mg | ORAL_TABLET | ORAL | Status: DC | PRN
  Administered 2024-02-07: 10 mg via ORAL
  Filled 2024-02-06: qty 2

## 2024-02-06 MED ORDER — MENTHOL 3 MG MT LOZG: 1.0000 | LOZENGE | OROMUCOSAL | Status: DC | PRN

## 2024-02-06 MED ORDER — METHOCARBAMOL 1000 MG/10ML IJ SOLN: 500.0000 mg | Freq: Four times a day (QID) | INTRAMUSCULAR | Status: DC | PRN

## 2024-02-06 MED ORDER — ACETAMINOPHEN 325 MG PO TABS: 325.0000 mg | ORAL_TABLET | Freq: Four times a day (QID) | ORAL | Status: DC | PRN

## 2024-02-06 MED ORDER — PHENYLEPHRINE 80 MCG/ML (10ML) SYRINGE FOR IV PUSH (FOR BLOOD PRESSURE SUPPORT)
PREFILLED_SYRINGE | INTRAVENOUS | Status: AC
  Filled 2024-02-06: qty 20

## 2024-02-06 MED ORDER — VANCOMYCIN HCL IN DEXTROSE 1-5 GM/200ML-% IV SOLN
1000.0000 mg | Freq: Once | INTRAVENOUS | Status: AC
  Administered 2024-02-06: 1000 mg via INTRAVENOUS
  Filled 2024-02-06: qty 200

## 2024-02-06 MED ORDER — 0.9 % SODIUM CHLORIDE (POUR BTL) OPTIME
TOPICAL | Status: DC | PRN
  Administered 2024-02-06: 1000 mL

## 2024-02-06 MED ORDER — ONDANSETRON HCL 4 MG/2ML IJ SOLN
INTRAMUSCULAR | Status: DC | PRN
  Administered 2024-02-06: 4 mg via INTRAVENOUS

## 2024-02-06 MED ORDER — GLYCOPYRROLATE PF 0.2 MG/ML IJ SOSY
PREFILLED_SYRINGE | INTRAMUSCULAR | Status: DC | PRN
Start: 2024-02-06 — End: 2024-02-06
  Administered 2024-02-06: .2 mg via INTRAVENOUS

## 2024-02-06 MED ORDER — DOCUSATE SODIUM 100 MG PO CAPS
100.0000 mg | ORAL_CAPSULE | Freq: Two times a day (BID) | ORAL | Status: DC
  Administered 2024-02-06 – 2024-02-07 (×3): 100 mg via ORAL
  Filled 2024-02-06 (×3): qty 1

## 2024-02-06 MED ORDER — ASPIRIN 81 MG PO CHEW
81.0000 mg | CHEWABLE_TABLET | Freq: Two times a day (BID) | ORAL | Status: DC
  Administered 2024-02-06 – 2024-02-07 (×2): 81 mg via ORAL
  Filled 2024-02-06 (×2): qty 1

## 2024-02-06 MED ORDER — METHOCARBAMOL 500 MG PO TABS
500.0000 mg | ORAL_TABLET | Freq: Four times a day (QID) | ORAL | Status: DC | PRN
  Administered 2024-02-06 – 2024-02-07 (×2): 500 mg via ORAL
  Filled 2024-02-06 (×2): qty 1

## 2024-02-06 MED ORDER — PHENOL 1.4 % MT LIQD: 1.0000 | OROMUCOSAL | Status: DC | PRN

## 2024-02-06 MED ORDER — OXYCODONE HCL 5 MG PO TABS: 10.0000 mg | ORAL_TABLET | ORAL | Status: DC | PRN

## 2024-02-06 MED ORDER — TRANEXAMIC ACID-NACL 1000-0.7 MG/100ML-% IV SOLN
1000.0000 mg | Freq: Once | INTRAVENOUS | Status: AC
  Administered 2024-02-06: 1000 mg via INTRAVENOUS
  Filled 2024-02-06: qty 100

## 2024-02-06 MED ORDER — TRANEXAMIC ACID-NACL 1000-0.7 MG/100ML-% IV SOLN
1000.0000 mg | INTRAVENOUS | Status: AC
  Administered 2024-02-06: 1000 mg via INTRAVENOUS
  Filled 2024-02-06: qty 100

## 2024-02-06 MED ORDER — SODIUM CHLORIDE 0.9 % IR SOLN
Status: DC | PRN
  Administered 2024-02-06: 3000 mL

## 2024-02-06 MED ORDER — OXYCODONE HCL ER 10 MG PO T12A
10.0000 mg | EXTENDED_RELEASE_TABLET | Freq: Two times a day (BID) | ORAL | Status: DC
  Administered 2024-02-06 (×2): 10 mg via ORAL
  Filled 2024-02-06 (×2): qty 1

## 2024-02-06 MED ORDER — BUPIVACAINE-MELOXICAM ER 400-12 MG/14ML IJ SOLN
INTRAMUSCULAR | Status: AC
  Filled 2024-02-06: qty 1

## 2024-02-06 MED ORDER — DOXYCYCLINE HYCLATE 100 MG PO TABS
100.0000 mg | ORAL_TABLET | Freq: Two times a day (BID) | ORAL | Status: DC
  Administered 2024-02-06 – 2024-02-07 (×3): 100 mg via ORAL
  Filled 2024-02-06 (×3): qty 1

## 2024-02-06 MED ORDER — CEFAZOLIN SODIUM 1 G IJ SOLR
INTRAMUSCULAR | Status: AC
  Filled 2024-02-06: qty 20

## 2024-02-06 MED ORDER — LACTATED RINGERS IV SOLN: INTRAVENOUS | Status: DC

## 2024-02-06 MED ORDER — ALUM & MAG HYDROXIDE-SIMETH 200-200-20 MG/5ML PO SUSP: 30.0000 mL | ORAL | Status: DC | PRN

## 2024-02-06 MED ORDER — CHLORHEXIDINE GLUCONATE 4 % EX SOLN: 1.0000 | CUTANEOUS | 1 refills | Status: DC

## 2024-02-06 MED ORDER — MIDAZOLAM HCL 2 MG/2ML IJ SOLN
INTRAMUSCULAR | Status: DC | PRN
  Administered 2024-02-06: 2 mg via INTRAVENOUS

## 2024-02-06 MED ORDER — LACTATED RINGERS IV BOLUS: 500.0000 mL | Freq: Once | INTRAVENOUS | Status: AC

## 2024-02-06 MED ORDER — DIPHENHYDRAMINE HCL 12.5 MG/5ML PO ELIX: 25.0000 mg | ORAL_SOLUTION | ORAL | Status: DC | PRN

## 2024-02-06 MED ORDER — DEXAMETHASONE SODIUM PHOSPHATE 10 MG/ML IJ SOLN
INTRAMUSCULAR | Status: AC
  Filled 2024-02-06: qty 1

## 2024-02-06 SURGICAL SUPPLY — 52 items
BAG COUNTER SPONGE SURGICOUNT (BAG) ×1 IMPLANT
BAG DECANTER FOR FLEXI CONT (MISCELLANEOUS) ×1 IMPLANT
BLADE SAG 18X100X1.27 (BLADE) ×1 IMPLANT
CABLE READY CERCLAGE W/CRIP (Trauma Fixation) IMPLANT
CNTNR URN SCR LID CUP LEK RST (MISCELLANEOUS) IMPLANT
COVER PERINEAL POST (MISCELLANEOUS) ×1 IMPLANT
COVER SURGICAL LIGHT HANDLE (MISCELLANEOUS) ×1 IMPLANT
DERMABOND ADVANCED .7 DNX12 (GAUZE/BANDAGES/DRESSINGS) IMPLANT
DRAPE C-ARM 42X72 X-RAY (DRAPES) ×1 IMPLANT
DRAPE POUCH INSTRU U-SHP 10X18 (DRAPES) ×1 IMPLANT
DRAPE STERI IOBAN 125X83 (DRAPES) ×1 IMPLANT
DRAPE U-SHAPE 47X51 STRL (DRAPES) ×2 IMPLANT
DRSG AQUACEL AG ADV 3.5X10 (GAUZE/BANDAGES/DRESSINGS) ×1 IMPLANT
DURAPREP 26ML APPLICATOR (WOUND CARE) ×2 IMPLANT
ELECTRODE BLDE 4.0 EZ CLN MEGD (MISCELLANEOUS) ×1 IMPLANT
ELECTRODE REM PT RTRN 9FT ADLT (ELECTROSURGICAL) ×1 IMPLANT
GLOVE BIOGEL PI IND STRL 7.0 (GLOVE) ×2 IMPLANT
GLOVE BIOGEL PI IND STRL 7.5 (GLOVE) ×5 IMPLANT
GLOVE ECLIPSE 7.0 STRL STRAW (GLOVE) ×2 IMPLANT
GLOVE SKINSENSE STRL SZ7.5 (GLOVE) ×1 IMPLANT
GLOVE SURG SYN 7.5 E (GLOVE) ×3 IMPLANT
GLOVE SURG SYN 7.5 PF PI (GLOVE) ×2 IMPLANT
GLOVE SURG UNDER POLY LF SZ7 (GLOVE) ×3 IMPLANT
GLOVE SURG UNDER POLY LF SZ7.5 (GLOVE) ×2 IMPLANT
GOWN STRL REUS W/ TWL XL LVL3 (GOWN DISPOSABLE) ×1 IMPLANT
GOWN STRL SURGICAL XL XLNG (GOWN DISPOSABLE) ×1 IMPLANT
GOWN TOGA ZIPPER T7+ PEEL AWAY (MISCELLANEOUS) ×1 IMPLANT
HEAD CERAMIC DELTA 36 PLUS 1.5 (Hips) IMPLANT
HOOD PEEL AWAY T7 (MISCELLANEOUS) ×1 IMPLANT
IV NS IRRIG 3000ML ARTHROMATIC (IV SOLUTION) ×1 IMPLANT
KIT BASIN OR (CUSTOM PROCEDURE TRAY) ×1 IMPLANT
LINER NEUTRAL 52X36MM PLUS 4 (Liner) IMPLANT
MARKER SKIN DUAL TIP RULER LAB (MISCELLANEOUS) ×1 IMPLANT
NDL SPNL 18GX3.5 QUINCKE PK (NEEDLE) ×1 IMPLANT
NEEDLE SPNL 18GX3.5 QUINCKE PK (NEEDLE) ×1 IMPLANT
PACK TOTAL JOINT (CUSTOM PROCEDURE TRAY) ×1 IMPLANT
PACK UNIVERSAL I (CUSTOM PROCEDURE TRAY) ×1 IMPLANT
PIN SECTOR W/GRIP ACE CUP 52MM (Hips) IMPLANT
SET HNDPC FAN SPRY TIP SCT (DISPOSABLE) ×1 IMPLANT
SOLUTION PRONTOSAN WOUND 350ML (IRRIGATION / IRRIGATOR) ×1 IMPLANT
STEM FEM ACTIS HIGH SZ2 (Stem) IMPLANT
SUT ETHIBOND 2 V 37 (SUTURE) ×1 IMPLANT
SUT ETHILON 2 0 FS 18 (SUTURE) IMPLANT
SUT STRATAFIX PDS+ 0 24IN (SUTURE) IMPLANT
SUT VIC AB 0 CT1 27XBRD ANBCTR (SUTURE) ×1 IMPLANT
SUT VIC AB 1 CTX36XBRD ANBCTR (SUTURE) ×1 IMPLANT
SUT VIC AB 2-0 CT1 TAPERPNT 27 (SUTURE) ×2 IMPLANT
SYR 50ML LL SCALE MARK (SYRINGE) ×1 IMPLANT
TOWEL GREEN STERILE (TOWEL DISPOSABLE) ×1 IMPLANT
TRAY CATH INTERMITTENT SS 16FR (CATHETERS) IMPLANT
TUBE SUCT ARGYLE STRL (TUBING) ×1 IMPLANT
YANKAUER SUCT BULB TIP NO VENT (SUCTIONS) ×1 IMPLANT

## 2024-02-06 NOTE — H&P (Signed)
 PREOPERATIVE H&P  Chief Complaint: left hip avascular necrosis  HPI: Annette Richard is a 68 y.o. female who presents for surgical treatment of left hip avascular necrosis.  She denies any changes in medical history.  Past Surgical History:  Procedure Laterality Date   ABDOMINAL HYSTERECTOMY     APPENDECTOMY     removed with right salpingoophorectomy   BREAST LUMPECTOMY WITH RADIOACTIVE SEED AND SENTINEL LYMPH NODE BIOPSY Left 11/16/2021   Procedure: LEFT BREAST LUMPECTOMY WITH RADIOACTIVE SEED AND SENTINEL LYMPH NODE BIOPSY;  Surgeon: Sim Dryer, MD;  Location: [facility];  Service: General;  Laterality: Left;   CATARACT EXTRACTION W/ INTRAOCULAR LENS IMPLANT Bilateral 2024   CHOLECYSTECTOMY     2000s   FACIAL LACERATION REPAIR N/A 07/10/2021   Procedure: FACIAL LACERATION REPAIR;  Surgeon: Anda Bamberg, MD;  Location: [facility] OR;  Service: General;  Laterality: N/A;   HARDWARE REMOVAL Left 11/04/2023   Procedure: LEFT HIP HARDWARE REMOVAL, BONEGRAFTING;  Surgeon: Wes Hamman, MD;  Location: [facility] OR;  Service: Orthopedics;  Laterality: Left;   HIP CLOSED REDUCTION Left 07/10/2021   Procedure: ATTEMPTED CLOSED REDUCTION HIP, OPEN REDUCTION LEFT HIP;  Surgeon: Laneta Pintos, MD;  Location: [facility] OR;  Service: Orthopedics;  Laterality: Left;   KNEE ARTHROSCOPY WITH MENISCAL REPAIR Right    SALPINGECTOMY Left    Ectopic Pregnancy   SALPINGOOPHORECTOMY Right    Social History   Socioeconomic History   Marital status: Widowed    Spouse name: Not on file   Number of children: 2   Years of education: Not on file   Highest education level: Associate degree: occupational, Scientist, product/process development, or vocational program  Occupational History   Occupation: Retired Public house manager  Tobacco Use   Smoking status: Every Day    Current packs/day: 1.00    Average packs/day: 1 pack/day for 15.0 years (15.0 ttl pk-yrs)    Types: Cigarettes   Smokeless tobacco: Never   Tobacco comments:    Less  than one pack of cigarettes as of 10/30/23  Vaping Use   Vaping status: Never Used  Substance and Sexual Activity   Alcohol use: Not Currently    Comment: Maybe once a year   Drug use: Never   Sexual activity: Never    Birth control/protection: Abstinence  Other Topics Concern   Not on file  Social History Narrative   ** Merged History Encounter **       Social Drivers of Health   Financial Resource Strain: High Risk (11/18/2023)   Overall Financial Resource Strain (CARDIA)    Difficulty of Paying Living Expenses: Very hard  Food Insecurity: Food Insecurity Present (11/18/2023)   Hunger Vital Sign    Worried About Running Out of Food in the Last Year: Often true    Ran Out of Food in the Last Year: Often true  Transportation Needs: Unmet Transportation Needs (11/18/2023)   PRAPARE - Transportation    Lack of Transportation (Medical): Yes    Lack of Transportation (Non-Medical): Yes  Physical Activity: Insufficiently Active (11/18/2023)   Exercise Vital Sign    Days of Exercise per Week: 2 days    Minutes of Exercise per Session: 20 min  Stress: No Stress Concern Present (11/18/2023)   [employer] of Occupational Health - Occupational Stress Questionnaire    Feeling of Stress : Not at all  Social Connections: Socially Isolated (11/18/2023)   Social Connection and Isolation Panel [NHANES]    Frequency of Communication with  Friends and Family: Once a week    Frequency of Social Gatherings with Friends and Family: More than three times a week    Attends Religious Services: Never    Database administrator or Organizations: No    Attends Engineer, structural: Not on file    Marital Status: Widowed   Family History  Problem Relation Age of Onset   Pancreatic cancer Mother 42   Mesothelioma Father 73   Breast cancer Sister 42       reports negative genetic testing   Colon polyps Sister        precancerous   Breast cancer Sister        dx. 30s   Pulmonary fibrosis  Brother    CVA Maternal Grandfather    Breast cancer Maternal Great-grandmother    No Known Allergies Prior to Admission medications   Medication Sig Start Date End Date Taking? Authorizing Provider  amLODipine  (NORVASC ) 5 MG tablet Take 1 tablet (5 mg total) by mouth daily. 11/18/23  Yes Lawrance Presume, MD  atorvastatin  (LIPITOR) 10 MG tablet Take 1 tablet (10 mg total) by mouth daily. 11/18/23  Yes Lawrance Presume, MD  Calcium  Carbonate-Vit D-Min (CALCIUM  1200) 1200-1000 MG-UNIT CHEW Chew 2 tablets by mouth in the morning.   Yes [provider]  docusate sodium  (COLACE) 100 MG capsule Take 1 capsule (100 mg total) by mouth daily as needed. 01/30/24 01/29/25  Sandie Cross, PA-C  ibuprofen  (ADVIL ) 200 MG tablet Take 800 mg by mouth every 8 (eight) hours as needed (pain.).   Yes [provider]  ondansetron  (ZOFRAN ) 4 MG tablet Take 1 tablet (4 mg total) by mouth every 8 (eight) hours as needed for nausea or vomiting. 10/28/23  Yes Sandie Cross, PA-C  ondansetron  (ZOFRAN ) 4 MG tablet Take 1 tablet (4 mg total) by mouth every 8 (eight) hours as needed for nausea or vomiting. 01/30/24   Sandie Cross, PA-C  oxyCODONE -acetaminophen  (PERCOCET) 5-325 MG tablet Take 1-2 tablets by mouth every 6 (six) hours as needed. To be taken after surgery 01/30/24   Sandie Cross, PA-C  sennosides-docusate sodium  (SENOKOT-S) 8.6-50 MG tablet Take 1 tablet by mouth every other day.   Yes [provider]  anastrozole  (ARIMIDEX ) 1 MG tablet TAKE 1 TABLET BY MOUTH EVERY DAY 01/26/24   Causey, Laura Polio, NP  aspirin  (ASPIRIN  81) 81 MG chewable tablet Chew 1 tablet (81 mg total) by mouth 2 (two) times daily. To be taken after surgery to prevent blood clots Patient taking differently: Chew 81 mg by mouth daily. 10/28/23   Sandie Cross, PA-C  docusate sodium  (COLACE) 100 MG capsule Take 100 mg by mouth daily as needed for mild constipation.    [provider]   doxycycline  (VIBRAMYCIN ) 100 MG capsule Take 1 capsule (100 mg total) by mouth 2 (two) times daily. To be taken after surgery 01/30/24   Sandie Cross, PA-C  Multiple Vitamins-Iron  (MULTIVITAMINS WITH IRON ) TABS tablet Take 1 tablet by mouth daily. 07/14/21   Maczis, Michael M, PA-C  tiZANidine  (ZANAFLEX ) 2 MG tablet TAKE 1 TO 2 TABLETS (2-4 MG TOTAL) BY MOUTH AT BEDTIME AS NEEDED FOR MUSCLE SPASM 01/27/24   Corey, Evan S, MD  traMADol  (ULTRAM ) 50 MG tablet TAKE 1 TABLET BY MOUTH EVERY 12 HOURS AS NEEDED 01/27/24   Sandie Cross, PA-C     Positive ROS: All other systems have been reviewed and were otherwise negative with  the exception of those mentioned in the HPI and as above.  Physical Exam: General: Alert, no acute distress Cardiovascular: No pedal edema Respiratory: No cyanosis, no use of accessory musculature GI: abdomen soft Skin: No lesions in the area of chief complaint Neurologic: Sensation intact distally Psychiatric: Patient is competent for consent with normal mood and affect Lymphatic: no lymphedema  MUSCULOSKELETAL: exam stable  Assessment: left hip avascular necrosis  Plan: Plan for Procedure(s): ARTHROPLASTY, HIP, TOTAL, ANTERIOR APPROACH  The risks benefits and alternatives were discussed with the patient including but not limited to the risks of nonoperative treatment, versus surgical intervention including infection, bleeding, nerve injury,  blood clots, cardiopulmonary complications, morbidity, mortality, among others, and they were willing to proceed.   Claria Crofts, MD 02/06/2024 6:08 AM

## 2024-02-06 NOTE — Anesthesia Procedure Notes (Signed)
 Spinal  Patient location during procedure: OR Start time: 02/06/2024 7:32 AM End time: 02/06/2024 7:38 AM Reason for block: surgical anesthesia Staffing Performed: anesthesiologist  Anesthesiologist: Jacquelyne Matte, DO Performed by: Jacquelyne Matte, DO Authorized by: Jacquelyne Matte, DO   Preanesthetic Checklist Completed: patient identified, IV checked, risks and benefits discussed, surgical consent, monitors and equipment checked, pre-op evaluation and timeout performed Spinal Block Patient position: sitting Prep: DuraPrep and site prepped and draped Patient monitoring: cardiac monitor, continuous pulse ox and blood pressure Approach: midline Location: L3-4 Injection technique: single-shot Needle Needle type: Pencan  Needle gauge: 24 G Needle length: 9 cm Assessment Sensory level: T6 Events: CSF return Additional Notes Functioning IV was confirmed and monitors were applied. Sterile prep and drape, including hand hygiene and sterile gloves were used. The patient was positioned and the spine was prepped. The skin was anesthetized with lidocaine .  Free flow of clear CSF was obtained prior to injecting local anesthetic into the CSF.  The spinal needle aspirated freely following injection.  The needle was carefully withdrawn.  The patient tolerated the procedure well.

## 2024-02-06 NOTE — Evaluation (Signed)
 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 PMhx: appendectomy, Lt hip reduction  Clinical Impression  Pt pleasant and reports pain 3/10 at present with pain much higher PTA. Pt familiar with TDWB and restrictions from prior sx and able to demonstrate gait and transfers well. Pt educated for HEP with handout provided and encouraged throughout the day. Pt with decreased transfers and gait who will benefit from acute therapy to maximize mobility and safety. Pt mobility appropriate for return home.         If plan is discharge home, recommend the following: A little help with bathing/dressing/bathroom;Assist for transportation;Help with stairs or ramp for entrance   Can travel by private vehicle        Equipment Recommendations None recommended by PT  Recommendations for Other Services       Functional Status Assessment Patient has had a recent decline in their functional status and demonstrates the ability to make significant improvements in function in a reasonable and predictable amount of time.     Precautions / Restrictions Precautions Precautions: Fall Restrictions Weight Bearing Restrictions Per Provider Order: Yes LLE Weight Bearing Per Provider Order: Touchdown weight bearing      Mobility  Bed Mobility Overal bed mobility: Modified Independent             General bed mobility comments: increased time with bed flat    Transfers Overall transfer level: Needs assistance   Transfers: Sit to/from Stand Sit to Stand: Supervision           General transfer comment: cues for hand and foot placement to maintain TDWB and safety    Ambulation/Gait Ambulation/Gait assistance: Supervision Gait Distance (Feet): 140 Feet Assistive device: Rolling walker (2 wheels) Gait Pattern/deviations:  Step-to pattern   Gait velocity interpretation: <1.8 ft/sec, indicate of risk for recurrent falls   General Gait Details: Pt maintaining TDWB throughout gait with periodic pressure checks on therapist foot. Good use of RW  Stairs            Wheelchair Mobility     Tilt Bed    Modified Rankin (Stroke Patients Only)       Balance Overall balance assessment: Needs assistance Sitting-balance support: No upper extremity supported Sitting balance-Leahy Scale: Good     Standing balance support: Bilateral upper extremity supported, Reliant on assistive device for balance Standing balance-Leahy Scale: Poor                               Pertinent Vitals/Pain Pain Assessment Pain Assessment: 0-10 Pain Score: 3  Pain Location: left hip Pain Descriptors / Indicators: Aching Pain Intervention(s): Limited activity within patient's tolerance, Repositioned, Monitored during session    Home Living Family/patient expects to be discharged to:: Private residence Living Arrangements: Children Available Help at Discharge: Family;Available 24 hours/day Type of Home: House Home Access: Stairs to enter   [employer] of Steps: 3   Home Layout: One level Home Equipment: Agricultural consultant (2 wheels);BSC/3in1;Cane - single point;Crutches      Prior Function Prior Level of Function : Independent/Modified Independent             Mobility Comments: retired Charity fundraiser, doesn't drive, was walking with cane       Extremity/Trunk Assessment   Upper Extremity Assessment Upper Extremity Assessment:  Overall WFL for tasks assessed    Lower Extremity Assessment Lower Extremity Assessment: LLE deficits/detail LLE Deficits / Details: limited ROM and strength due to post op pain    Cervical / Trunk Assessment Cervical / Trunk Assessment: Normal  Communication   Communication Communication: No apparent difficulties    Cognition Arousal: Alert Behavior During  Therapy: WFL for tasks assessed/performed   PT - Cognitive impairments: No apparent impairments                         Following commands: Intact       Cueing Cueing Techniques: Verbal cues     General Comments      Exercises Total Joint Exercises Long Arc Quad: AROM, Left, Seated, 10 reps Marching in Standing: AROM, Left, Seated, 10 reps   Assessment/Plan    PT Assessment Patient needs continued PT services  PT Problem List Decreased activity tolerance;Decreased mobility;Decreased strength;Decreased range of motion;Pain       PT Treatment Interventions Functional mobility training;Therapeutic activities;Therapeutic exercise;Patient/family education;Gait training;Stair training;DME instruction    PT Goals (Current goals can be found in the Care Plan section)  Acute Rehab PT Goals Patient Stated Goal: return home PT Goal Formulation: With patient Time For Goal Achievement: 02/13/24 Potential to Achieve Goals: Good    Frequency 7X/week     Co-evaluation               AM-PAC PT "6 Clicks" Mobility  Outcome Measure Help needed turning from your back to your side while in a flat bed without using bedrails?: None Help needed moving from lying on your back to sitting on the side of a flat bed without using bedrails?: A Little Help needed moving to and from a bed to a chair (including a wheelchair)?: A Little Help needed standing up from a chair using your arms (e.g., wheelchair or bedside chair)?: A Little Help needed to walk in hospital room?: A Little Help needed climbing 3-5 steps with a railing? : A Little 6 Click Score: 19    End of Session   Activity Tolerance: Patient tolerated treatment well Patient left: in chair;with call bell/phone within reach Nurse Communication: Mobility status PT Visit Diagnosis: Other abnormalities of gait and mobility (R26.89)    Time: 0865-7846 PT Time Calculation (min) (ACUTE ONLY): 17 min   Charges:   PT  Evaluation $PT Eval Low Complexity: 1 Low   PT General Charges $$ ACUTE PT VISIT: 1 Visit         Annis Baseman, PT Acute Rehabilitation Services Office: [phone]   Jackey Mary Thuan Tippett 02/06/2024, 1:38 PM

## 2024-02-06 NOTE — Discharge Instructions (Signed)

## 2024-02-06 NOTE — OR Nursing (Signed)
 In and out catheter placed at 0946 in OR 11 with 300 mL of yellow clear urine.

## 2024-02-06 NOTE — Transfer of Care (Signed)
 Immediate Anesthesia Transfer of Care Note  Patient: Annette Richard  Procedure(s) Performed: ARTHROPLASTY, HIP, TOTAL, ANTERIOR APPROACH (Left: Hip)  Patient Location: PACU  Anesthesia Type:MAC and Spinal  Level of Consciousness: awake and alert   Airway & Oxygen Therapy: Patient Spontanous Breathing and Patient connected to face mask oxygen  Post-op Assessment: Report given to RN and Post -op Vital signs reviewed and stable  Post vital signs: Reviewed and stable  Last Vitals:  Vitals Value Taken Time  BP 97/62 02/06/24 0953  Temp    Pulse 68 02/06/24 0956  Resp 18 02/06/24 0956  SpO2 94 % 02/06/24 0956  Vitals shown include unfiled device data.  Last Pain:  Vitals:   02/06/24 0659  TempSrc:   PainSc: 3       Patients Stated Pain Goal: 3 (02/06/24 0659)  Complications: No notable events documented.

## 2024-02-06 NOTE — Plan of Care (Signed)

## 2024-02-06 NOTE — Anesthesia Postprocedure Evaluation (Signed)
 Anesthesia Post Note  Patient: Annette Richard  Procedure(s) Performed: ARTHROPLASTY, HIP, TOTAL, ANTERIOR APPROACH (Left: Hip)     Patient location during evaluation: PACU Anesthesia Type: MAC and Spinal Level of consciousness: oriented and awake and alert Pain management: pain level controlled Vital Signs Assessment: post-procedure vital signs reviewed and stable Respiratory status: spontaneous breathing, respiratory function stable and patient connected to nasal cannula oxygen Cardiovascular status: blood pressure returned to baseline and stable Postop Assessment: no headache, no backache, no apparent nausea or vomiting and spinal receding Anesthetic complications: no   No notable events documented.  Last Vitals:  Vitals:   02/06/24 1015 02/06/24 1030  BP: 125/74 102/62  Pulse: 62 65  Resp: 11 (!) 21  Temp:    SpO2: 100% 98%    Last Pain:  Vitals:   02/06/24 0954  TempSrc:   PainSc: 10-Worst pain ever                 Jacquelyne Matte

## 2024-02-06 NOTE — H&P (Signed)
 PREOPERATIVE H&P  Chief Complaint: left hip avascular necrosis  HPI: Annette Richard is a 68 y.o. female who presents for surgical treatment of left hip avascular necrosis.  She denies any changes in medical history.  Past Surgical History:  Procedure Laterality Date  . ABDOMINAL HYSTERECTOMY    . APPENDECTOMY     removed with right salpingoophorectomy  . BREAST LUMPECTOMY WITH RADIOACTIVE SEED AND SENTINEL LYMPH NODE BIOPSY Left 11/16/2021   Procedure: LEFT BREAST LUMPECTOMY WITH RADIOACTIVE SEED AND SENTINEL LYMPH NODE BIOPSY;  Surgeon: Sim Dryer, MD;  Location: [facility];  Service: General;  Laterality: Left;  . CATARACT EXTRACTION W/ INTRAOCULAR LENS IMPLANT Bilateral 2024  . CHOLECYSTECTOMY     2000s  . FACIAL LACERATION REPAIR N/A 07/10/2021   Procedure: FACIAL LACERATION REPAIR;  Surgeon: Anda Bamberg, MD;  Location: [facility] OR;  Service: General;  Laterality: N/A;  . HARDWARE REMOVAL Left 11/04/2023   Procedure: LEFT HIP HARDWARE REMOVAL, BONEGRAFTING;  Surgeon: Wes Hamman, MD;  Location: [facility] OR;  Service: Orthopedics;  Laterality: Left;  . HIP CLOSED REDUCTION Left 07/10/2021   Procedure: ATTEMPTED CLOSED REDUCTION HIP, OPEN REDUCTION LEFT HIP;  Surgeon: Laneta Pintos, MD;  Location: [facility] OR;  Service: Orthopedics;  Laterality: Left;  . KNEE ARTHROSCOPY WITH MENISCAL REPAIR Right   . SALPINGECTOMY Left    Ectopic Pregnancy  . SALPINGOOPHORECTOMY Right    Social History   Socioeconomic History  . Marital status: Widowed    Spouse name: Not on file  . Number of children: 2  . Years of education: Not on file  . Highest education level: Associate degree: occupational, Scientist, product/process development, or vocational program  Occupational History  . Occupation: Retired Public house manager  Tobacco Use  . Smoking status: Every Day    Current packs/day: 1.00    Average packs/day: 1 pack/day for 15.0 years (15.0 ttl pk-yrs)    Types: Cigarettes  . Smokeless tobacco: Never  . Tobacco  comments:    Less than one pack of cigarettes as of 10/30/23  Vaping Use  . Vaping status: Never Used  Substance and Sexual Activity  . Alcohol use: Not Currently    Comment: Maybe once a year  . Drug use: Never  . Sexual activity: Never    Birth control/protection: Abstinence  Other Topics Concern  . Not on file  Social History Narrative   ** Merged History Encounter **       Social Drivers of Health   Financial Resource Strain: High Risk (11/18/2023)   Overall Financial Resource Strain (CARDIA)   . Difficulty of Paying Living Expenses: Very hard  Food Insecurity: Food Insecurity Present (11/18/2023)   Hunger Vital Sign   . Worried About Programme researcher, broadcasting/film/video in the Last Year: Often true   . Ran Out of Food in the Last Year: Often true  Transportation Needs: Unmet Transportation Needs (11/18/2023)   PRAPARE - Transportation   . Lack of Transportation (Medical): Yes   . Lack of Transportation (Non-Medical): Yes  Physical Activity: Insufficiently Active (11/18/2023)   Exercise Vital Sign   . Days of Exercise per Week: 2 days   . Minutes of Exercise per Session: 20 min  Stress: No Stress Concern Present (11/18/2023)   [employer] of Occupational Health - Occupational Stress Questionnaire   . Feeling of Stress : Not at all  Social Connections: Socially Isolated (11/18/2023)   Social Connection and Isolation Panel [NHANES]   . Frequency of Communication with  Friends and Family: Once a week   . Frequency of Social Gatherings with Friends and Family: More than three times a week   . Attends Religious Services: Never   . Active Member of Clubs or Organizations: No   . Attends Banker Meetings: Not on file   . Marital Status: Widowed   Family History  Problem Relation Age of Onset  . Pancreatic cancer Mother 28  . Mesothelioma Father 64  . Breast cancer Sister 67       reports negative genetic testing  . Colon polyps Sister        precancerous  . Breast cancer Sister         dx. 30s  . Pulmonary fibrosis Brother   . CVA Maternal Grandfather   . Breast cancer Maternal Great-grandmother    No Known Allergies Prior to Admission medications   Medication Sig Start Date End Date Taking? Authorizing Provider  amLODipine  (NORVASC ) 5 MG tablet Take 1 tablet (5 mg total) by mouth daily. 11/18/23  Yes Lawrance Presume, MD  atorvastatin  (LIPITOR) 10 MG tablet Take 1 tablet (10 mg total) by mouth daily. 11/18/23  Yes Lawrance Presume, MD  Calcium  Carbonate-Vit D-Min (CALCIUM  1200) 1200-1000 MG-UNIT CHEW Chew 2 tablets by mouth in the morning.   Yes [provider]  docusate sodium  (COLACE) 100 MG capsule Take 1 capsule (100 mg total) by mouth daily as needed. 01/30/24 01/29/25  Sandie Cross, PA-C  ibuprofen  (ADVIL ) 200 MG tablet Take 800 mg by mouth every 8 (eight) hours as needed (pain.).   Yes [provider]  ondansetron  (ZOFRAN ) 4 MG tablet Take 1 tablet (4 mg total) by mouth every 8 (eight) hours as needed for nausea or vomiting. 10/28/23  Yes Sandie Cross, PA-C  ondansetron  (ZOFRAN ) 4 MG tablet Take 1 tablet (4 mg total) by mouth every 8 (eight) hours as needed for nausea or vomiting. 01/30/24   Sandie Cross, PA-C  oxyCODONE -acetaminophen  (PERCOCET) 5-325 MG tablet Take 1-2 tablets by mouth every 6 (six) hours as needed. To be taken after surgery 01/30/24   Sandie Cross, PA-C  sennosides-docusate sodium  (SENOKOT-S) 8.6-50 MG tablet Take 1 tablet by mouth every other day.   Yes [provider]  anastrozole  (ARIMIDEX ) 1 MG tablet TAKE 1 TABLET BY MOUTH EVERY DAY 01/26/24   Causey, Laura Polio, NP  aspirin  (ASPIRIN  81) 81 MG chewable tablet Chew 1 tablet (81 mg total) by mouth 2 (two) times daily. To be taken after surgery to prevent blood clots Patient taking differently: Chew 81 mg by mouth daily. 10/28/23   Sandie Cross, PA-C  docusate sodium  (COLACE) 100 MG capsule Take 100 mg by mouth daily as needed for mild  constipation.    [provider]  doxycycline  (VIBRAMYCIN ) 100 MG capsule Take 1 capsule (100 mg total) by mouth 2 (two) times daily. To be taken after surgery 01/30/24   Sandie Cross, PA-C  Multiple Vitamins-Iron  (MULTIVITAMINS WITH IRON ) TABS tablet Take 1 tablet by mouth daily. 07/14/21   Maczis, Michael M, PA-C  tiZANidine  (ZANAFLEX ) 2 MG tablet TAKE 1 TO 2 TABLETS (2-4 MG TOTAL) BY MOUTH AT BEDTIME AS NEEDED FOR MUSCLE SPASM 01/27/24   Corey, Evan S, MD  traMADol  (ULTRAM ) 50 MG tablet TAKE 1 TABLET BY MOUTH EVERY 12 HOURS AS NEEDED 01/27/24   Sandie Cross, PA-C     Positive ROS: All other systems have been reviewed and were otherwise negative with  the exception of those mentioned in the HPI and as above.  Physical Exam: General: Alert, no acute distress Cardiovascular: No pedal edema Respiratory: No cyanosis, no use of accessory musculature GI: abdomen soft Skin: No lesions in the area of chief complaint Neurologic: Sensation intact distally Psychiatric: Patient is competent for consent with normal mood and affect Lymphatic: no lymphedema  MUSCULOSKELETAL: exam stable  Assessment: left hip avascular necrosis  Plan: Plan for Procedure(s): ARTHROPLASTY, HIP, TOTAL, ANTERIOR APPROACH  The risks benefits and alternatives were discussed with the patient including but not limited to the risks of nonoperative treatment, versus surgical intervention including infection, bleeding, nerve injury,  blood clots, cardiopulmonary complications, morbidity, mortality, among others, and they were willing to proceed.   Claria Crofts, MD 02/06/2024 6:50 AM

## 2024-02-06 NOTE — Anesthesia Preprocedure Evaluation (Addendum)
 Anesthesia Evaluation  Patient identified by MRN, date of birth, ID band Patient awake    Reviewed: Allergy & Precautions, H&P , NPO status , Patient's Chart, lab work & pertinent test results  History of Anesthesia Complications (+) PONV and history of anesthetic complications (nausea w/ GA for last hip surgery)  Airway Mallampati: III  TM Distance: >3 FB Neck ROM: Full    Dental  (+) Edentulous Upper   Pulmonary Current Smoker and Patient abstained from smoking. 15 pack year history    Pulmonary exam normal breath sounds clear to auscultation       Cardiovascular hypertension (127/83 preop), Pt. on medications +CHF (normal LVEF, grade 1 diastolis dysfunction)  Normal cardiovascular exam+ Valvular Problems/Murmurs (mod MR) MR  Rhythm:Regular Rate:Normal  Echo 2017 - Left ventricle: The cavity size was normal. Systolic function was    normal. The estimated ejection fraction was 65%. Wall motion was    normal; there were no regional wall motion abnormalities. Doppler    parameters are consistent with abnormal left ventricular    relaxation (grade 1 diastolic dysfunction).  - Mitral valve: There was moderate regurgitation.  - Atrial septum: No defect or patent foramen ovale was identified.     Neuro/Psych CVA (2017), No Residual Symptoms  negative psych ROS   GI/Hepatic Neg liver ROS,GERD  Controlled,,  Endo/Other  negative endocrine ROS    Renal/GU negative Renal ROS  negative genitourinary   Musculoskeletal  (+) Arthritis , Osteoarthritis,  L hip AVN   Abdominal   Peds negative pediatric ROS (+)  Hematology negative hematology ROS (+) Hb 14.2, plt 259   Anesthesia Other Findings   Reproductive/Obstetrics negative OB ROS                             Anesthesia Physical Anesthesia Plan  ASA: 3  Anesthesia Plan: Spinal and MAC   Post-op Pain Management: Tylenol  PO (pre-op)*    Induction:   PONV Risk Score and Plan: 2 and Propofol  infusion and TIVA  Airway Management Planned: Natural Airway and Nasal Cannula  Additional Equipment: None  Intra-op Plan:   Post-operative Plan:   Informed Consent: I have reviewed the patients History and Physical, chart, labs and discussed the procedure including the risks, benefits and alternatives for the proposed anesthesia with the patient or authorized representative who has indicated his/her understanding and acceptance.       Plan Discussed with: CRNA  Anesthesia Plan Comments:         Anesthesia Quick Evaluation

## 2024-02-06 NOTE — Op Note (Signed)
 ARTHROPLASTY, HIP, TOTAL, ANTERIOR APPROACH  Procedure Note Annette Richard   [id number]  Pre-op Diagnosis: left hip avascular necrosis, post-traumatic arthritis     Post-op Diagnosis: same  Operative Findings Avascular necrosis and secondary posttraumatic arthritis   Operative Procedures  1.  Conversion of prior hip surgery to a total hip arthroplasty 2.  Removal of deep hardware from femoral canal 3.  Prophylactic placement of cable around calcar  Surgeon: Dyana Glade, M.D.  Assist: Sharran Decent, PA-C   Anesthesia: Spinal  Prosthesis: Depuy Acetabulum: Pinnacle 52 mm Femur: Actis 2 high offset Head: 36 mm size: +1.5 Liner: +4 neutral Bearing Type: Ceramic on poly  Total Hip Arthroplasty (Anterior Approach) Op Note:  After informed consent was obtained and the operative extremity marked in the holding area, the patient was brought back to the operating room and placed supine on the HANA table. Next, the operative extremity was prepped and draped in normal sterile fashion. Surgical timeout occurred verifying patient identification, surgical site, surgical procedure and administration of antibiotics.  A 10 cm longitudinal incision was made starting from 2 fingerbreadths lateral and inferior to the ASIS towards the lateral aspect of the patella.  A Hueter approach to the hip was performed, using the interval between tensor fascia lata and sartorius.  Dissection was carried bluntly down onto the anterior hip capsule. The lateral femoral circumflex vessels were identified and coagulated. A capsulectomy was performed as the capsule was severely scarred from with posttraumatic arthritis.  The neck osteotomy was performed a fingerbreadth above the lesser trochanter. The femoral head was removed which showed avascular necrosis, delaminated cartilage, the acetabular rim was cleared of soft tissue and osteophytes and attention was turned to reaming the acetabulum.  Sequential reaming  was performed under fluoroscopic guidance down to the floor of the cotyloid fossa. We reamed to a size 51 mm, and then impacted the acetabular shell.   The +4 neutral liner was then placed after irrigation and attention turned to the femur.  After placing the femoral hook, the leg was taken to externally rotated, extended and adducted position taking care to perform soft tissue releases to allow for adequate mobilization of the femur. Soft tissue was cleared from the shoulder of the greater trochanter and the hook elevator used to improve exposure of the proximal femur.  There was a few metallic wires in the femoral canal that were removed with a rondure to allow femoral preparation.  Box osteotome used to remove the medullary bone.  Chili pepper was used to gain entry into the canal.  The lateral bone was significantly sclerotic.  A 2 mm fracture at the calcar was recognized when I inserted the chili pepper.  The fracture did not propagate into the lesser trochanter.  The chili pepper was then removed.  A cable was passed around the proximal femur above the level of the lesser trochanter and tension.  This kept the fracture reduced.  We then resumed femoral preparation.  Lateralization was performed through the sclerotic bone.  Sequential broaching performed up to a size 2.  Standard trial neck and +1.5 head were placed. The leg was brought back up to neutral and the construct reduced.  The position and sizing of components, offset and leg lengths were checked using fluoroscopy. Stability of the construct was checked in 45 degrees of hip extension and 90 degrees of external rotation without any subluxation, shuck or impingement of prosthesis.  I felt she needed a little more offset to match  her native anatomy.  We were able to restore her leg length with the +1.5 head.  We dislocated the prosthesis, dropped the leg back into position, removed trial components, and irrigated copiously. The final high offset stem  and head was then placed, the leg brought back up, the system reduced and fluoroscopy used to verify positioning.  Antibiotic irrigation was placed in the surgical wound.   We irrigated, obtained hemostasis and closed the capsule using #2 ethibond suture.  A topical mixture of 0.25% bupivacaine  and meloxicam was placed deep to the fascia.  One gram of vancomycin  powder was placed in the surgical bed.   One gram of topical tranexamic acid  was injected into the joint.  The fascia was closed with #1 stratafix, the deep fat layer was closed with 0 vicryl, the subcutaneous layers closed with 2.0 Vicryl Plus and the skin closed with 2-0 nylon and Dermabond. A sterile dressing was applied. The patient was awakened in the operating room and taken to recovery in stable condition.  All sponge, needle, and instrument counts were correct at the end of the case.   [name], my PA, was a medical necessity for opening, closing, limb positioning, retracting, exposing, and overall facilitation and timely completion of the surgery.  Position: supine  Complications: see description of procedure.  Time Out: performed   Drains/Packing: none  Estimated blood loss: see anesthesia record  Returned to Recovery Room: in good condition.   Antibiotics: yes   Mechanical VTE (DVT) Prophylaxis: sequential compression devices, TED thigh-high  Chemical VTE (DVT) Prophylaxis: Aspirin   Fluid Replacement: see anesthesia record  Specimens Removed: 1 to pathology   Sponge and Instrument Count Correct? yes   PACU: portable radiograph - low AP   Plan/RTC: Return in 2 weeks for suture removal. Weight Bearing/Load Lower Extremity: Touchdown weightbearing Hip precautions: none Suture Removal: 2 weeks   N. Claria Crofts, MD [facility] 9:27 AM   Implant Name Type Inv. Item Serial No. Manufacturer Lot No. LRB No. Used Action  PIN SECTOR W/GRIP ACE CUP - [id number] Hips PIN SECTOR W/GRIP ACE CUP   [employer]PU[employer] [id number] Left 1 Implanted  LINER NEUTRAL 52X36MM PLUS 4 - [id number] Liner LINER NEUTRAL [id number] PLUS 4  DEPU[employer] ORTH[employer] [id number] Left 1 Implanted  CABLE READY CERCLAGE W/CRIP - [id number] Trauma Fixation CABLE READY CERCLAGE W/CRIP  ZIMMER RECON(ORTH,TRAU,BIO,SG) [id number] Left 1 Implanted  STEM FEM ACTIS HIGH SZ2 - [id number] Stem STEM FEM ACTIS HIGH SZ2  DEPUY ORTHOPA[employer] [id number] Left 1 Implanted  HEAD CERAMIC DELTA 36 PLUS 1.5 - [id number] Hips HEAD CERAMIC DELTA 36 PLUS 1.5  [employer]PU[employer]TH[employer] [id number] Left 1 Implanted

## 2024-02-07 ENCOUNTER — Other Ambulatory Visit: Payer: Self-pay | Admitting: Physician Assistant

## 2024-02-07 ENCOUNTER — Encounter: Payer: Self-pay | Admitting: Orthopaedic Surgery

## 2024-02-07 DIAGNOSIS — M87852 Other osteonecrosis, left femur: Secondary | ICD-10-CM | POA: Diagnosis not present

## 2024-02-07 MED ORDER — CHLORHEXIDINE GLUCONATE 4 % EX SOLN: 1.0000 | CUTANEOUS | 1 refills | Status: DC

## 2024-02-07 NOTE — Progress Notes (Signed)
 Patient awaiting family for discharge home, Patient in no acute distress nor complaints of pain nor discomfort; incision on hip is clean, dry and intact; No c/o pain at this time. Room was checked and accounted for all patient's belongings; discharge instructions concerning his medications, incision care, follow up appointment and when to call the doctor as needed were all discussed with patient by RN and she expressed understanding on the instructions given.

## 2024-02-07 NOTE — Discharge Summary (Signed)
 Patient ID: Annette Richard MRN: [id number] DOB/AGE: Aug 09, 1956 68 y.o.  Admit date: 02/06/2024 Discharge date: 02/07/2024  Admission Diagnoses:  Principal Problem:   Avascular necrosis of bone of hip, left (HCC) Active Problems:   Status post total replacement of left hip   Discharge Diagnoses:  Same  Past Medical History:  Diagnosis Date   Arthritis    Breast cancer (HCC)    Left Breast   Hyperlipidemia    Hypertension    Osteopenia    Pneumonia    PONV (postoperative nausea and vomiting)    nausea only - after hip surgery 10/2023   Stroke (HCC) 06/26/2016   TIA    Surgeries: Procedure(s): ARTHROPLASTY, HIP, TOTAL, ANTERIOR APPROACH on 02/06/2024   Consultants:   Discharged Condition: Improved  Hospital Course: Annette Richard is an 68 y.o. female who was admitted 02/06/2024 for operative treatment ofAvascular necrosis of bone of hip, left (HCC). Patient has severe unremitting pain that affects sleep, daily activities, and work/hobbies. After pre-op clearance the patient was taken to the operating room on 02/06/2024 and underwent  Procedure(s): ARTHROPLASTY, HIP, TOTAL, ANTERIOR APPROACH.    Patient was given perioperative antibiotics:  Anti-infectives (From admission, onward)    Start     Dose/Rate Route Frequency Ordered Stop   02/06/24 1100  doxycycline  (VIBRA -TABS) tablet 100 mg       Note to Pharmacy: To be taken after surgery     100 mg Oral 2 times daily 02/06/24 1058     02/06/24 1100  ceFAZolin  (ANCEF ) IVPB 2g/100 mL premix        2 g 200 mL/hr over 30 Minutes Intravenous Every 6 hours 02/06/24 1058 02/07/24 0911   02/06/24 0908  vancomycin  (VANCOCIN ) powder  Status:  Discontinued          As needed 02/06/24 0912 02/06/24 0950   02/06/24 0600  ceFAZolin  (ANCEF ) IVPB 2g/100 mL premix        2 g 200 mL/hr over 30 Minutes Intravenous On call to O.R. 02/06/24 0546 02/06/24 0751   02/06/24 0600  vancomycin  (VANCOCIN ) IVPB 1000 mg/200 mL premix        1,000  mg 200 mL/hr over 60 Minutes Intravenous  Once 02/06/24 0546 02/06/24 0752        Patient was given sequential compression devices, early ambulation, and chemoprophylaxis to prevent DVT.  Patient benefited maximally from hospital stay and there were no complications.    Recent vital signs: Patient Vitals for the past 24 hrs:  BP Temp Temp src Pulse Resp SpO2  02/07/24 0745 126/75 98.9 F (37.2 C) Oral (!) 59 20 99 %  02/07/24 0347 125/68 98 F (36.7 C) Oral 71 18 100 %  02/07/24 0023 107/60 97.6 F (36.4 C) Oral (!) 57 16 99 %  02/06/24 1946 116/70 97.9 F (36.6 C) Oral (!) 55 18 100 %  02/06/24 1613 97/62 97.7 F (36.5 C) Oral 98 16 99 %  02/06/24 1145 -- 98 F (36.7 C) -- (!) 57 18 100 %  02/06/24 1130 135/85 -- -- (!) 57 17 100 %  02/06/24 1115 136/76 -- -- (!) 59 16 99 %  02/06/24 1045 (!) 140/68 98.1 F (36.7 C) -- 60 12 100 %  02/06/24 1030 102/62 -- -- 65 (!) 21 98 %  02/06/24 1015 125/74 -- -- 62 11 100 %  02/06/24 1000 102/77 -- -- 68 -- 98 %  02/06/24 0954 97/62 98 F (36.7 C) -- 66 18 94 %  Recent laboratory studies: No results for input(s): "WBC", "HGB", "HCT", "PLT", "NA", "K", "CL", "CO2", "BUN", "CREATININE", "GLUCOSE", "INR", "CALCIUM " in the last 72 hours.  Invalid input(s): "PT", "2"   Discharge Medications:   Allergies as of 02/07/2024   No Known Allergies      Medication List     STOP taking these medications    ibuprofen  200 MG tablet Commonly known as: ADVIL    traMADol  50 MG tablet Commonly known as: ULTRAM        TAKE these medications    amLODipine  5 MG tablet Commonly known as: NORVASC  Take 1 tablet (5 mg total) by mouth daily.   anastrozole  1 MG tablet Commonly known as: ARIMIDEX  TAKE 1 TABLET BY MOUTH EVERY DAY   aspirin  81 MG chewable tablet Commonly known as: Aspirin  81 Chew 1 tablet (81 mg total) by mouth 2 (two) times daily. To be taken after surgery to prevent blood clots   atorvastatin  10 MG tablet Commonly  known as: LIPITOR Take 1 tablet (10 mg total) by mouth daily.   Calcium  1200 1200-1000 MG-UNIT Chew Chew 2 tablets by mouth in the morning.   chlorhexidine  4 % external liquid Commonly known as: HIBICLENS  Apply 15 mLs (1 Application total) topically as directed for 30 doses. Use as directed daily for 5 days every other week for 6 weeks.   docusate sodium  100 MG capsule Commonly known as: Colace Take 1 capsule (100 mg total) by mouth daily as needed. What changed: Another medication with the same name was removed. Continue taking this medication, and follow the directions you see here.   doxycycline  100 MG capsule Commonly known as: Vibramycin  Take 1 capsule (100 mg total) by mouth 2 (two) times daily. To be taken after surgery   multivitamins with iron  Tabs tablet Take 1 tablet by mouth daily.   mupirocin ointment 2 % Commonly known as: BACTROBAN Place 1 Application into the nose 2 (two) times daily for 60 doses. Use as directed 2 times daily for 5 days every other week for 6 weeks.   ondansetron  4 MG tablet Commonly known as: Zofran  Take 1 tablet (4 mg total) by mouth every 8 (eight) hours as needed for nausea or vomiting. What changed: Another medication with the same name was removed. Continue taking this medication, and follow the directions you see here.   oxyCODONE -acetaminophen  5-325 MG tablet Commonly known as: Percocet Take 1-2 tablets by mouth every 6 (six) hours as needed. To be taken after surgery   sennosides-docusate sodium  8.6-50 MG tablet Commonly known as: SENOKOT-S Take 1 tablet by mouth every other day.   tiZANidine  2 MG tablet Commonly known as: ZANAFLEX  TAKE 1 TO 2 TABLETS (2-4 MG TOTAL) BY MOUTH AT BEDTIME AS NEEDED FOR MUSCLE SPASM               Durable Medical Equipment  (From admission, onward)           Start     Ordered   02/06/24 1059  DME Walker rolling  Once       Question:  Patient needs a walker to treat with the following  condition  Answer:  History of hip replacement   02/06/24 1058   02/06/24 1059  DME 3 n 1  Once        02/06/24 1058   02/06/24 1059  DME Bedside commode  Once       Question:  Patient needs a bedside commode to treat with the following condition  Answer:  History of hip replacement   02/06/24 1058            Diagnostic Studies: DG Pelvis Portable Result Date: 02/06/2024 CLINICAL DATA:  Left hip pain. EXAM: PORTABLE PELVIS 1-2 VIEWS COMPARISON:  None Available. FINDINGS: There is a total left hip arthroplasty. The arthroplasty components appear intact and in anatomic alignment. No acute fracture or dislocation. Postsurgical changes in the soft tissues of the left hip. IMPRESSION: Total left hip arthroplasty. Electronically Signed   By: Angus Bark M.D.   On: 02/06/2024 14:24   DG HIP UNILAT WITH PELVIS 1V LEFT Result Date: 02/06/2024 CLINICAL DATA:  Elective surgery.  Total left hip arthroplasty. EXAM: DG HIP (WITH OR WITHOUT PELVIS) 1V*L* COMPARISON:  Left hip radiographs 12/17/2023, CT left hip 12/25/2023 FINDINGS: Images were performed intraoperatively without the presence of a radiologist. Superior left femoral head collapse is again seen. Screw tracts from prior intermittent a shin that has now been removed again seen within the proximal left femur. There appear to be cerclage wires overlying the distal femoral neck and intertrochanteric region, similar to prior. The patient is undergoing total left hip arthroplasty. No hardware complication is seen. Total fluoroscopy images: 7 Total fluoroscopy time: 22 seconds Total dose: Radiation Exposure Index (as provided by the fluoroscopic device): 2.25 mGy air Kerma Please see intraoperative findings for further detail. IMPRESSION: Intraoperative fluoroscopy for total left hip arthroplasty. Electronically Signed   By: Bertina Broccoli M.D.   On: 02/06/2024 12:30   DG C-Arm 1-60 Min-No Report Result Date: 02/06/2024 Fluoroscopy was utilized by the  requesting physician.  No radiographic interpretation.   DG C-Arm 1-60 Min-No Report Result Date: 02/06/2024 Fluoroscopy was utilized by the requesting physician.  No radiographic interpretation.    Disposition: Discharge disposition: 01-Home or Self Care          Follow-up Information     Sandie Cross, PA-C. Schedule an appointment as soon as possible for a visit in 2 week(s).   Specialty: Orthopedic Surgery Contact information: [address] [place]  [place] [address] [phone]         [facility] Follow up.   Why: [facility] will contact you for the first home visit. Contact information: [phone]                 Signed: Sandie Cross 02/07/2024, 9:26 AM

## 2024-02-07 NOTE — Progress Notes (Signed)
 Physical Therapy Treatment Patient Details Name: Annette Richard MRN: [id number] DOB: 21-Apr-1956 Today's Date: 02/07/2024   History of Present Illness 68 yo female admitted 02/06/24 for Lt anterior THA due to AVN. PMhx: appendectomy, Lt hip reduction    PT Comments  Pt progressing well with mobility. Mod I bed mobility, supervision transfers, and supervision amb 200' with RW. Pt demo good ability to maintain TDEB LLE. Stair training complete. Ascend/descend 3 steps with R crutch and min/HHA on L. Educated on direct anterior surgical approach and no ROM restrictions. Pt sitting up in recliner at end of session.      If plan is discharge home, recommend the following: A little help with bathing/dressing/bathroom;Assist for transportation;Help with stairs or ramp for entrance   Can travel by private vehicle        Equipment Recommendations  None recommended by PT    Recommendations for Other Services       Precautions / Restrictions Precautions Precautions: Fall Restrictions LLE Weight Bearing Per Provider Order: Touchdown weight bearing Other Position/Activity Restrictions: direct ant, no prec     Mobility  Bed Mobility Overal bed mobility: Modified Independent             General bed mobility comments: increased time, used leg lifter on LLE    Transfers Overall transfer level: Needs assistance Equipment used: Rolling walker (2 wheels) Transfers: Sit to/from Stand Sit to Stand: Supervision           General transfer comment: Pt demo good technique    Ambulation/Gait Ambulation/Gait assistance: Supervision Gait Distance (Feet): 200 Feet Assistive device: Rolling walker (2 wheels) Gait Pattern/deviations: Step-to pattern, Step-through pattern, Decreased stride length, Antalgic Gait velocity: decreased Gait velocity interpretation: <1.31 ft/sec, indicative of household ambulator   General Gait Details: Pt demo good ability to maintain TDWB  LLE.   Stairs Stairs: Yes Stairs assistance: Min assist Stair Management: No rails, Step to pattern, With crutches, Forwards Number of Stairs: 3 General stair comments: one crutch on R and min/HHA on L. Cues for sequencing.   Wheelchair Mobility     Tilt Bed    Modified Rankin (Stroke Patients Only)       Balance Overall balance assessment: Needs assistance Sitting-balance support: No upper extremity supported, Feet supported Sitting balance-Leahy Scale: Good     Standing balance support: Bilateral upper extremity supported, Reliant on assistive device for balance, During functional activity Standing balance-Leahy Scale: Poor Standing balance comment: RW support due to TDWB LLE.                            Communication Communication Communication: No apparent difficulties  Cognition Arousal: Alert Behavior During Therapy: WFL for tasks assessed/performed   PT - Cognitive impairments: No apparent impairments                         Following commands: Intact      Cueing Cueing Techniques: Verbal cues  Exercises      General Comments        Pertinent Vitals/Pain Pain Assessment Pain Assessment: Faces Faces Pain Scale: Hurts a little bit Pain Location: left hip Pain Descriptors / Indicators: Sore, Discomfort, Grimacing Pain Intervention(s): Monitored during session, Repositioned, Ice applied    Home Living                          Prior Function  PT Goals (current goals can now be found in the care plan section) Acute Rehab PT Goals Patient Stated Goal: return home Progress towards PT goals: Progressing toward goals    Frequency    7X/week      PT Plan      Co-evaluation              AM-PAC PT "6 Clicks" Mobility   Outcome Measure  Help needed turning from your back to your side while in a flat bed without using bedrails?: None Help needed moving from lying on your back to sitting on  the side of a flat bed without using bedrails?: None Help needed moving to and from a bed to a chair (including a wheelchair)?: A Little Help needed standing up from a chair using your arms (e.g., wheelchair or bedside chair)?: A Little Help needed to walk in hospital room?: A Little Help needed climbing 3-5 steps with a railing? : A Little 6 Click Score: 20    End of Session Equipment Utilized During Treatment: Gait belt Activity Tolerance: Patient tolerated treatment well Patient left: in chair;with call bell/phone within reach Nurse Communication: Mobility status PT Visit Diagnosis: Other abnormalities of gait and mobility (R26.89)     Time: 0812-0828 PT Time Calculation (min) (ACUTE ONLY): 16 min  Charges:    $Gait Training: 8-22 mins PT General Charges $$ ACUTE PT VISIT: 1 Visit                     Dorothye Gathers., PT  Office # [phone]    Guadelupe Leech 02/07/2024, 8:40 AM

## 2024-02-07 NOTE — Progress Notes (Signed)
 Subjective: 1 Day Post-Op Procedure(s) (LRB): ARTHROPLASTY, HIP, TOTAL, ANTERIOR APPROACH (Left) Patient reports pain as mild.    Objective: Vital signs in last 24 hours: Temp:  [97.6 F (36.4 C)-98.9 F (37.2 C)] 98.9 F (37.2 C) (05/23 0745) Pulse Rate:  [55-98] 59 (05/23 0745) Resp:  [11-21] 20 (05/23 0745) BP: (97-140)/(60-85) 126/75 (05/23 0745) SpO2:  [94 %-100 %] 99 % (05/23 0745)  Intake/Output from previous day: 05/22 0701 - 05/23 0700 In: 1530 [P.O.:480; I.V.:850; IV Piggyback:200] Out: 350 [Urine:300; Blood:50] Intake/Output this shift: No intake/output data recorded.  No results for input(s): "HGB" in the last 72 hours. No results for input(s): "WBC", "RBC", "HCT", "PLT" in the last 72 hours. No results for input(s): "NA", "K", "CL", "CO2", "BUN", "CREATININE", "GLUCOSE", "CALCIUM " in the last 72 hours. No results for input(s): "LABPT", "INR" in the last 72 hours.  Neurologically intact Neurovascular intact Sensation intact distally Intact pulses distally Dorsiflexion/Plantar flexion intact Incision: dressing C/D/I No cellulitis present Compartment soft   Assessment/Plan: 1 Day Post-Op Procedure(s) (LRB): ARTHROPLASTY, HIP, TOTAL, ANTERIOR APPROACH (Left) Advance diet Up with therapy D/C IV fluids Discharge home with home health once cleared by PT TDWB LLE      Annette Richard 02/07/2024, 9:25 AM

## 2024-02-07 NOTE — Telephone Encounter (Signed)
 Sent hibiclens 

## 2024-02-07 NOTE — Care Management Obs Status (Signed)
 MEDICARE OBSERVATION STATUS NOTIFICATION   Patient Details  Name: Annette Richard MRN: [id number] Date of Birth: 18-Jun-1956   Medicare Observation Status Notification Given:  Yes    Felix Host 02/07/2024, 9:37 AM

## 2024-02-08 ENCOUNTER — Encounter: Payer: Self-pay | Admitting: Orthopaedic Surgery

## 2024-02-11 ENCOUNTER — Encounter (HOSPITAL_COMMUNITY): Payer: Self-pay | Admitting: Orthopaedic Surgery

## 2024-02-11 ENCOUNTER — Telehealth: Payer: Self-pay

## 2024-02-11 ENCOUNTER — Ambulatory Visit: Attending: Internal Medicine

## 2024-02-11 ENCOUNTER — Telehealth: Payer: Self-pay | Admitting: Internal Medicine

## 2024-02-11 ENCOUNTER — Other Ambulatory Visit: Payer: Self-pay | Admitting: Physician Assistant

## 2024-02-11 VITALS — Wt 156.0 lb

## 2024-02-11 DIAGNOSIS — Z599 Problem related to housing and economic circumstances, unspecified: Secondary | ICD-10-CM | POA: Diagnosis not present

## 2024-02-11 DIAGNOSIS — Z0000 Encounter for general adult medical examination without abnormal findings: Secondary | ICD-10-CM

## 2024-02-11 DIAGNOSIS — Z5982 Transportation insecurity: Secondary | ICD-10-CM | POA: Diagnosis not present

## 2024-02-11 DIAGNOSIS — Z5941 Food insecurity: Secondary | ICD-10-CM

## 2024-02-11 MED ORDER — ASPIRIN 81 MG PO CHEW: 81.0000 mg | CHEWABLE_TABLET | Freq: Two times a day (BID) | ORAL | 0 refills | Status: DC

## 2024-02-11 NOTE — Telephone Encounter (Signed)
 Copied from CRM [id number]. Topic: Clinical - Home Health Verbal Orders >> Feb 11, 2024 11:56 AM Lotus Round B wrote:  Caller/Agency: cecila from [facility] Callback Number: [phone] Service Requested: Physical Therapy Frequency: 1 week 1 / 2 week 3 / 1 week 5  Any new concerns about the patient? No

## 2024-02-11 NOTE — Patient Instructions (Signed)
 Annette Richard , Thank you for taking time out of your busy schedule to complete your Annual Wellness Visit with me. I enjoyed our conversation and look forward to speaking with you again next year. I, as well as your care team,  appreciate your ongoing commitment to your health goals. Please review the following plan we discussed and let me know if I can assist you in the future. Your Game plan/ To Do List    Referrals: If you haven't heard from the office you've been referred to, please reach out to them at the phone provided.  Community Resources placed for assistance for transportation, food and financial insecurities.  Follow up Visits: Next Medicare AWV with our clinical staff: 02/16/2025 at 11:50 am phone visit with Nurse Health Advisor   Have you seen your provider in the last 6 months (3 months if uncontrolled diabetes)? Yes Next Office Visit with your provider: 02/25/2024 at 2:10 pm office visit with Dr. Lincoln Renshaw  Clinician Recommendations:  Aim for 30 minutes of exercise or brisk walking, 6-8 glasses of water, and 5 servings of fruits and vegetables each day.       This is a list of the screening recommended for you and due dates:  Health Maintenance  Topic Date Due   COVID-19 Vaccine (5 - 2024-25 season) 05/19/2023   Flu Shot  04/17/2024   Cologuard (Stool DNA test)  09/26/2024   Medicare Annual Wellness Visit  02/10/2025   Mammogram  12/02/2025   DTaP/Tdap/Td vaccine (3 - Td or Tdap) 07/11/2031   Pneumonia Vaccine  Completed   DEXA scan (bone density measurement)  Completed   Hepatitis C Screening  Completed   Zoster (Shingles) Vaccine  Completed   HPV Vaccine  Aged Out   Meningitis B Vaccine  Aged Out    Advanced directives: (Declined) Advance directive discussed with you today. Even though you declined this today, please call our office should you change your mind, and we can give you the proper paperwork for you to fill out. Advance Care Planning is important because  it:  [x]  Makes sure you receive the medical care that is consistent with your values, goals, and preferences  [x]  It provides guidance to your family and loved ones and reduces their decisional burden about whether or not they are making the right decisions based on your wishes.  Follow the link provided in your after visit summary or read over the paperwork we have mailed to you to help you started getting your Advance Directives in place. If you need assistance in completing these, please reach out to us  so that we can help you!  See attachments for Preventive Care and Fall Prevention Tips.

## 2024-02-11 NOTE — Progress Notes (Signed)
 Because this visit was a virtual/telehealth visit,  certain criteria was not obtained, such a blood pressure, CBG if applicable, and timed get up and go. Any medications not marked as "taking" were not mentioned during the medication reconciliation part of the visit. Any vitals not documented were not able to be obtained due to this being a telehealth visit or patient was unable to self-report a recent blood pressure reading due to a lack of equipment at home via telehealth. Vitals that have been documented are verbally provided by the patient.   Subjective:   Annette Richard is a 68 y.o. who presents for a Medicare Wellness preventive visit.  As a reminder, Annual Wellness Visits don't include a physical exam, and some assessments may be limited, especially if this visit is performed virtually. We may recommend an in-person follow-up visit with your provider if needed.  Visit Complete: Virtual I connected with  Annette Richard on 02/11/24 by a audio enabled telemedicine application and verified that I am speaking with the correct person using two identifiers.  Patient Location: Home  Provider Location: Office/Clinic  I discussed the limitations of evaluation and management by telemedicine. The patient expressed understanding and agreed to proceed.  Vital Signs: Because this visit was a virtual/telehealth visit, some criteria may be missing or patient reported. Any vitals not documented were not able to be obtained and vitals that have been documented are patient reported.  VideoDeclined- This patient declined Librarian, academic. Therefore the visit was completed with audio only.  Persons Participating in Visit: Patient.  AWV Questionnaire: No: Patient Medicare AWV questionnaire was not completed prior to this visit.  Cardiac Risk Factors include: advanced age (>39men, >33 women);sedentary lifestyle;smoking/ tobacco exposure     Objective:     Today's  Vitals   02/11/24 1346  Weight: 156 lb (70.8 kg)  PainSc: 4   PainLoc: Hip   Body mass index is 25.18 kg/m.     02/11/2024    1:53 PM 01/28/2024   10:20 AM 10/30/2023   10:16 AM 07/17/2023   11:28 AM 11/23/2022    9:52 AM 12/13/2021    9:07 AM 11/16/2021    8:52 AM  Advanced Directives  Does Patient Have a Medical Advance Directive? No No No Yes No No No  Type of Advance Directive    Living will;Healthcare Power of Attorney     Does patient want to make changes to medical advance directive?    No - Patient declined     Copy of Healthcare Power of Attorney in Chart?    No - copy requested     Would patient like information on creating a medical advance directive? No - Patient declined Yes (MAU/Ambulatory/Procedural Areas - Information given) No - Patient declined  Yes (MAU/Ambulatory/Procedural Areas - Information given) Yes (ED - Information included in AVS) Yes (MAU/Ambulatory/Procedural Areas - Information given)    Current Medications (verified) Outpatient Encounter Medications as of 02/11/2024  Medication Sig   docusate sodium  (COLACE) 100 MG capsule Take 1 capsule (100 mg total) by mouth daily as needed.   ondansetron  (ZOFRAN ) 4 MG tablet Take 1 tablet (4 mg total) by mouth every 8 (eight) hours as needed for nausea or vomiting.   oxyCODONE -acetaminophen  (PERCOCET) 5-325 MG tablet Take 1-2 tablets by mouth every 6 (six) hours as needed. To be taken after surgery   amLODipine  (NORVASC ) 5 MG tablet Take 1 tablet (5 mg total) by mouth daily.   anastrozole  (ARIMIDEX ) 1  MG tablet TAKE 1 TABLET BY MOUTH EVERY DAY   aspirin  (ASPIRIN  81) 81 MG chewable tablet Chew 1 tablet (81 mg total) by mouth 2 (two) times daily. To be taken after surgery to prevent blood clots   atorvastatin  (LIPITOR) 10 MG tablet Take 1 tablet (10 mg total) by mouth daily.   Calcium  Carbonate-Vit D-Min (CALCIUM  1200) 1200-1000 MG-UNIT CHEW Chew 2 tablets by mouth in the morning.   chlorhexidine  (HIBICLENS ) 4 % external  liquid Apply 15 mLs (1 Application total) topically as directed for 30 doses. Use as directed daily for 5 days every other week for 6 weeks.   doxycycline  (VIBRAMYCIN ) 100 MG capsule Take 1 capsule (100 mg total) by mouth 2 (two) times daily. To be taken after surgery   Multiple Vitamins-Iron  (MULTIVITAMINS WITH IRON ) TABS tablet Take 1 tablet by mouth daily.   mupirocin ointment (BACTROBAN) 2 % Place 1 Application into the nose 2 (two) times daily for 60 doses. Use as directed 2 times daily for 5 days every other week for 6 weeks.   sennosides-docusate sodium  (SENOKOT-S) 8.6-50 MG tablet Take 1 tablet by mouth every other day.   tiZANidine  (ZANAFLEX ) 2 MG tablet TAKE 1 TO 2 TABLETS (2-4 MG TOTAL) BY MOUTH AT BEDTIME AS NEEDED FOR MUSCLE SPASM   No facility-administered encounter medications on file as of 02/11/2024.    Allergies (verified) Patient has no known allergies.   History: Past Medical History:  Diagnosis Date   Arthritis    Breast cancer (HCC)    Left Breast   Hyperlipidemia    Hypertension    Osteopenia    Pneumonia    PONV (postoperative nausea and vomiting)    nausea only - after hip surgery 10/2023   Stroke (HCC) 06/26/2016   TIA   Past Surgical History:  Procedure Laterality Date   ABDOMINAL HYSTERECTOMY     APPENDECTOMY     removed with right salpingoophorectomy   BREAST LUMPECTOMY WITH RADIOACTIVE SEED AND SENTINEL LYMPH NODE BIOPSY Left 11/16/2021   Procedure: LEFT BREAST LUMPECTOMY WITH RADIOACTIVE SEED AND SENTINEL LYMPH NODE BIOPSY;  Surgeon: Sim Dryer, MD;  Location: [facility];  Service: General;  Laterality: Left;   CATARACT EXTRACTION W/ INTRAOCULAR LENS IMPLANT Bilateral 2024   CHOLECYSTECTOMY     2000s   FACIAL LACERATION REPAIR N/A 07/10/2021   Procedure: FACIAL LACERATION REPAIR;  Surgeon: Anda Bamberg, MD;  Location: [facility] OR;  Service: General;  Laterality: N/A;   HARDWARE REMOVAL Left 11/04/2023   Procedure: LEFT HIP  HARDWARE REMOVAL, BONEGRAFTING;  Surgeon: Wes Hamman, MD;  Location: [facility] OR;  Service: Orthopedics;  Laterality: Left;   HIP CLOSED REDUCTION Left 07/10/2021   Procedure: ATTEMPTED CLOSED REDUCTION HIP, OPEN REDUCTION LEFT HIP;  Surgeon: Laneta Pintos, MD;  Location: [facility];  Service: Orthopedics;  Laterality: Left;   KNEE ARTHROSCOPY WITH MENISCAL REPAIR Right    SALPINGECTOMY Left    Ectopic Pregnancy   SALPINGOOPHORECTOMY Right    TOTAL HIP ARTHROPLASTY Left 02/06/2024   Procedure: ARTHROPLASTY, HIP, TOTAL, ANTERIOR APPROACH;  Surgeon: Wes Hamman, MD;  Location: [facility];  Service: Orthopedics;  Laterality: Left;  3-C   Family History  Problem Relation Age of Onset   Pancreatic cancer Mother 41   Mesothelioma Father 71   Breast cancer Sister 80       reports negative genetic testing   Colon polyps Sister        precancerous   Breast cancer Sister  dx. 30s   Pulmonary fibrosis Brother    CVA Maternal Grandfather    Breast cancer Maternal Great-grandmother    Social History   Socioeconomic History   Marital status: Widowed    Spouse name: Not on file   Number of children: 2   Years of education: Not on file   Highest education level: Associate degree: occupational, Scientist, product/process development, or vocational program  Occupational History   Occupation: Retired Public house manager  Tobacco Use   Smoking status: Every Day    Current packs/day: 1.00    Average packs/day: 1 pack/day for 15.0 years (15.0 ttl pk-yrs)    Types: Cigarettes   Smokeless tobacco: Never   Tobacco comments:    Less than one pack of cigarettes as of 10/30/23  Vaping Use   Vaping status: Never Used  Substance and Sexual Activity   Alcohol use: Not Currently    Comment: Maybe once a year   Drug use: Never   Sexual activity: Never    Birth control/protection: Abstinence  Other Topics Concern   Not on file  Social History Narrative   ** Merged History Encounter **       Social Drivers of Health   Financial Resource  Strain: High Risk (02/11/2024)   Overall Financial Resource Strain (CARDIA)    Difficulty of Paying Living Expenses: Very hard  Food Insecurity: No Food Insecurity (02/11/2024)   Hunger Vital Sign    Worried About Running Out of Food in the Last Year: Never true    Ran Out of Food in the Last Year: Never true  Recent Concern: Food Insecurity - Food Insecurity Present (11/18/2023)   Hunger Vital Sign    Worried About Running Out of Food in the Last Year: Often true    Ran Out of Food in the Last Year: Often true  Transportation Needs: Unmet Transportation Needs (02/11/2024)   PRAPARE - Transportation    Lack of Transportation (Medical): Yes    Lack of Transportation (Non-Medical): Yes  Physical Activity: Insufficiently Active (02/11/2024)   Exercise Vital Sign    Days of Exercise per Week: 2 days    Minutes of Exercise per Session: 50 min  Stress: No Stress Concern Present (02/11/2024)   [employer] of Occupational Health - Occupational Stress Questionnaire    Feeling of Stress : Not at all  Social Connections: Socially Isolated (02/11/2024)   Social Connection and Isolation Panel [NHANES]    Frequency of Communication with Friends and Family: Once a week    Frequency of Social Gatherings with Friends and Family: More than three times a week    Attends Religious Services: Never    Database administrator or Organizations: No    Attends Banker Meetings: Never    Marital Status: Widowed    Tobacco Counseling Ready to quit: Not Answered Counseling given: Not Answered Tobacco comments: Less than one pack of cigarettes as of 10/30/23    Clinical Intake:  Pre-visit preparation completed: Yes  Pain : 0-10 Pain Score: 4  Pain Type: Chronic pain Pain Location: Hip Pain Orientation: Left     BMI - recorded: 25.18 Nutritional Status: BMI 25 -29 Overweight Nutritional Risks: None Diabetes: No  No results found for: "HGBA1C"   How often do you need to have  someone help you when you read instructions, pamphlets, or other written materials from your doctor or pharmacy?: 1 - Never What is the last grade level you completed in school?: [employer]  Interpreter Needed?: No  Information entered by :: Druscilla Gerhard, LPN.   Activities of Daily Living     02/11/2024    1:57 PM 01/28/2024   10:23 AM  In your present state of health, do you have any difficulty performing the following activities:  Hearing? 0   Vision? 0   Difficulty concentrating or making decisions? 0   Walking or climbing stairs? 1   Dressing or bathing? 0   Doing errands, shopping? 0 1  Preparing Food and eating ? N   Using the Toilet? N   In the past six months, have you accidently leaked urine? N   Do you have problems with loss of bowel control? N   Managing your Medications? N   Managing your Finances? N   Housekeeping or managing your Housekeeping? N     Patient Care Team: Lawrance Presume, MD as PCP - General (Internal Medicine) Sim Dryer, MD as Consulting Physician (General Surgery) Murleen Arms, MD as Consulting Physician (Hematology and Oncology) Johna Myers, MD as Consulting Physician (Radiation Oncology)  Indicate any recent Medical Services you may have received from other than [facility] providers in the past year (date may be approximate).     Assessment:    This is a routine wellness examination for Annette Richard.  Hearing/Vision screen Hearing Screening - Comments:: Denies hearing difficulties.  Vision Screening - Comments:: Wears reading glasses, lens implants done 20/20 - up to date with routine eye exams with     Goals Addressed             This Visit's Progress    My goal is to quit smoking.         Depression Screen     02/11/2024    1:59 PM 11/18/2023    2:30 PM 11/23/2022    9:49 AM 10/18/2022   11:14 AM 01/11/2022    1:47 PM 09/14/2021    9:37 AM  PHQ 2/9 Scores  PHQ - 2 Score 0 1 0 0 0 0  PHQ- 9 Score 0 6 3 2 5       Fall Risk     02/11/2024    1:51 PM 01/11/2022    1:47 PM 09/14/2021    9:18 AM  Fall Risk   Falls in the past year? 0 0 0  Number falls in past yr: 0 0   Injury with Fall? 0 0   Risk for fall due to : No Fall Risks No Fall Risks   Follow up Falls evaluation completed      MEDICARE RISK AT HOME:  Medicare Risk at Home Any stairs in or around the home?: Yes (BEDROOM ON THE MAIN LEVEL) If so, are there any without handrails?: No Home free of loose throw rugs in walkways, pet beds, electrical cords, etc?: Yes Adequate lighting in your home to reduce risk of falls?: Yes Life alert?: No Use of a cane, walker or w/c?: Yes Grab bars in the bathroom?: No Shower chair or bench in shower?: No Elevated toilet seat or a handicapped toilet?: Yes  TIMED UP AND GO:  Was the test performed?  No  Cognitive Function: 6CIT completed    02/11/2024    1:53 PM 11/23/2022   10:10 AM  MMSE - Mini Mental State Exam  Not completed: Unable to complete   Orientation to time  5  Orientation to Place  5  Registration  3  Attention/ Calculation  5  Recall  3  Language- name 2 objects  2  Language- repeat  1  Language- follow 3 step command  3  Language- read & follow direction  1  Write a sentence  1  Copy design  1  Total score  30        02/11/2024    2:07 PM  6CIT Screen  What Year? 0 points  What month? 0 points  What time? 0 points  Count back from 20 0 points  Months in reverse 0 points  Repeat phrase 0 points  Total Score 0 points    Immunizations Immunization History  Administered Date(s) Administered   Fluad Quad(high Dose 65+) 10/18/2022   Influenza, Seasonal, Injecte, Preservative Fre 06/01/2015   Influenza,inj,Quad PF,6+ Mos 06/01/2015, 07/04/2016, 07/04/2016, 09/14/2021   Influenza-Unspecified 06/01/2015   Moderna Sars-Covid-2 Vaccination 11/27/2019, 12/30/2019   PFIZER(Purple Top)SARS-COV-2 Vaccination 08/21/2020   PPD Test 09/08/2016, 10/17/2019   Pfizer Covid-19  Vaccine Bivalent Booster 63yrs & up 10/27/2021   Pneumococcal Conjugate (Pcv15) 09/14/2021   Pneumococcal Polysaccharide-23 06/01/2015   Tdap 06/17/2013, 07/10/2021   Zoster Recombinant(Shingrix) 03/05/2022, 06/14/2022    Screening Tests Health Maintenance  Topic Date Due   COVID-19 Vaccine (5 - 2024-25 season) 05/19/2023   INFLUENZA VACCINE  04/17/2024   Fecal DNA (Cologuard)  09/26/2024   Medicare Annual Wellness (AWV)  02/10/2025   MAMMOGRAM  12/02/2025   DTaP/Tdap/Td (3 - Td or Tdap) 07/11/2031   Pneumonia Vaccine 75+ Years old  Completed   DEXA SCAN  Completed   Hepatitis C Screening  Completed   Zoster Vaccines- Shingrix  Completed   HPV VACCINES  Aged Out   Meningococcal B Vaccine  Aged Out    Health Maintenance  Health Maintenance Due  Topic Date Due   COVID-19 Vaccine (5 - 2024-25 season) 05/19/2023   Health Maintenance Items Addressed: Yes Patient aware of current care gaps.  Immunization record was verified by [facility] and updated in patient's chart.  Additional Screening:  Vision Screening: Recommended annual ophthalmology exams for early detection of glaucoma and other disorders of the eye.  Dental Screening: Recommended annual dental exams for proper oral hygiene  Community Resource Referral / Chronic Care Management: CRR required this visit?  No   CCM required this visit?  PCP informed of CCM need   Plan:    I have personally reviewed and noted the following in the patient's chart:   Medical and social history Use of alcohol, tobacco or illicit drugs  Current medications and supplements including opioid prescriptions. Patient is currently taking opioid prescriptions. Information provided to patient regarding non-opioid alternatives. Patient advised to discuss non-opioid treatment plan with their provider. Functional ability and status Nutritional status Physical activity Advanced directives List of other physicians Hospitalizations, surgeries, and  ER visits in previous 12 months Vitals Screenings to include cognitive, depression, and falls Referrals and appointments  In addition, I have reviewed and discussed with patient certain preventive protocols, quality metrics, and best practice recommendations. A written personalized care plan for preventive services as well as general preventive health recommendations were provided to patient.   Annette Sheldon, LPN   4/78/2956   After Visit Summary: (MyChart) Due to this being a telephonic visit, the after visit summary with patients personalized plan was offered to patient via MyChart   Notes: Patient is dealing with food, utility and financial securities. Referral placed for [employer].

## 2024-02-11 NOTE — Transitions of Care (Post Inpatient/ED Visit) (Signed)
 02/11/2024  Name: Annette Richard MRN: [id number] DOB: [birth year]  Today's TOC FU Call Status: Today's TOC FU Call Status:: Successful TOC FU Call Completed TOC FU Call Complete Date: 02/11/24 Patient's Name and Date of Birth confirmed.  Transition Care Management Follow-up Telephone Call Date of Discharge: 02/11/24 Discharge Facility: [facility]) Type of Discharge: Inpatient Admission Primary Inpatient Discharge Diagnosis:: s/p left total hip replacement How have you been since you were released from the hospital?: Better (She said overall she is feeling better, she just worked her muscles too hard with PT on Saturday.) Any questions or concerns?: No  Items Reviewed: Did you receive and understand the discharge instructions provided?: Yes Medications obtained,verified, and reconciled?: Yes (Medications Reviewed) Any new allergies since your discharge?: No Dietary orders reviewed?: Yes Type of Diet Ordered:: heart healthy, low sodium Do you have support at home?: No (She said she is not alone but she does not have alot of help)  Medications Reviewed Today: Medications Reviewed Today     Reviewed by Burnett Carson, RN (Case Manager) on 02/11/24 at 1303  Med List Status: <None>   Medication Order Taking? Sig Documenting Provider Last Dose Status Informant  amLODipine  (NORVASC ) 5 MG tablet [id number] No Take 1 tablet (5 mg total) by mouth daily. Lawrance Presume, MD 02/06/2024  3:00 AM Active Self  anastrozole  (ARIMIDEX ) 1 MG tablet [id number] No TAKE 1 TABLET BY MOUTH EVERY DAY Percival Brace, NP 02/05/2024 Active   aspirin  (ASPIRIN  81) 81 MG chewable tablet [id number]  Chew 1 tablet (81 mg total) by mouth 2 (two) times daily. To be taken after surgery to prevent blood clots Sandie Cross, PA-C  Active   atorvastatin  (LIPITOR) 10 MG tablet [id number] No Take 1 tablet (10 mg total) by mouth daily. Lawrance Presume, MD 02/06/2024  3:00 AM Active Self  Calcium   Carbonate-Vit D-Min (CALCIUM  1200) 1200-1000 MG-UNIT CHEW [id number] No Chew 2 tablets by mouth in the morning. [provider] 02/05/2024 Active Self  chlorhexidine  (HIBICLENS ) 4 % external liquid [id number]  Apply 15 mLs (1 Application total) topically as directed for 30 doses. Use as directed daily for 5 days every other week for 6 weeks. Sandie Cross, PA-C  Active   docusate sodium  (COLACE) 100 MG capsule [id number]  Take 1 capsule (100 mg total) by mouth daily as needed. Sandie Cross, PA-C  Active   doxycycline  (VIBRAMYCIN ) 100 MG capsule [id number]  Take 1 capsule (100 mg total) by mouth 2 (two) times daily. To be taken after surgery Sandie Cross, PA-C  Active   Multiple Vitamins-Iron  (MULTIVITAMINS WITH IRON ) TABS tablet [id number] No Take 1 tablet by mouth daily. Maczis, Michael M, PA-C Past Month Active Self           Med Note Alda Hummer Oct 24, 2023 12:02 PM) On hold per patient (needs to purchase more)  mupirocin ointment (BACTROBAN) 2 % [id number]  Place 1 Application into the nose 2 (two) times daily for 60 doses. Use as directed 2 times daily for 5 days every other week for 6 weeks. Wes Hamman, MD  Active   ondansetron  (ZOFRAN ) 4 MG tablet [id number]  Take 1 tablet (4 mg total) by mouth every 8 (eight) hours as needed for nausea or vomiting. Sandie Cross, PA-C  Active   oxyCODONE -acetaminophen  (PERCOCET) 5-325 MG tablet [id number]  Take 1-2 tablets by mouth every 6 (six) hours as needed. To be taken after surgery Flint Hummer,  Otilio Block, PA-C  Active   sennosides-docusate sodium  (SENOKOT-S) 8.6-50 MG tablet [id number] No Take 1 tablet by mouth every other day. [provider] Taking Active Self  tiZANidine  (ZANAFLEX ) 2 MG tablet [id number] No TAKE 1 TO 2 TABLETS (2-4 MG TOTAL) BY MOUTH AT BEDTIME AS NEEDED FOR MUSCLE SPASM Syliva Even, MD 02/05/2024 Active             Home Care and Equipment/Supplies: Were Home Health Services Ordered?: Yes Name of  Home Health Agency:: [facility] Has Agency set up a time to come to your home?: Yes First Home Health Visit Date: 02/08/24 Any new equipment or medical supplies ordered?: No (She said she already has the equipment she needs)  Functional Questionnaire: Do you need assistance with bathing/showering or dressing?: Yes (family needs to assist.  She said that a transfer tub bench will not fit in her bathroom.) Do you need assistance with meal preparation?: Yes (She said she can prepare the meals but can't carry anything with her walker and weight bearing limitations.  family needs to assist) Do you need assistance with eating?: No Do you have difficulty maintaining continence: No Do you need assistance with getting out of bed/getting out of a chair/moving?: Yes (ambulates with RW, touch down weight bearing LLE.) Do you have difficulty managing or taking your medications?: No  Follow up appointments reviewed: PCP Follow-up appointment confirmed?: No (She said she wants to get her other appointments taken care of before scheduling with PCP) MD Provider Line Number:[phone] Given: No Specialist Hospital Follow-up appointment confirmed?: Yes Date of Specialist follow-up appointment?: 02/21/24 Follow-Up Specialty Provider:: orthopedics; Do you need transportation to your follow-up appointment?: No Do you understand care options if your condition(s) worsen?: Yes-patient verbalized understanding    SIGNATURE Burnett Carson, RN

## 2024-02-11 NOTE — Telephone Encounter (Signed)
 Aspirin  81 mg twice daily x 6 weeks.  I just resent

## 2024-02-13 ENCOUNTER — Telehealth: Payer: Self-pay | Admitting: Internal Medicine

## 2024-02-13 NOTE — Telephone Encounter (Addendum)
 VO given to [facility] for Service Requested: Physical Therapy with [facility]. Frequency: 1 week 1 / 2 week 3 / 1 week 5.

## 2024-02-13 NOTE — Telephone Encounter (Signed)
 Copied from CRM [id number]. Topic: Clinical - Home Health Verbal Orders >> Feb 13, 2024  8:44 AM Lizabeth Riggs wrote: Home Health is calling back to get a verbal order for the above.Please call Cecila back at [phone]. (She has a secured voicemail so you can leave a message)

## 2024-02-13 NOTE — Telephone Encounter (Signed)
 VO given to [facility] for Service Requested: Physical Therapy with [facility]. Frequency: 1 week 1 / 2 week 3 / 1 week 5.

## 2024-02-21 ENCOUNTER — Other Ambulatory Visit (INDEPENDENT_AMBULATORY_CARE_PROVIDER_SITE_OTHER): Payer: Self-pay

## 2024-02-21 ENCOUNTER — Ambulatory Visit: Admitting: Physician Assistant

## 2024-02-21 ENCOUNTER — Encounter: Payer: Self-pay | Admitting: Physician Assistant

## 2024-02-21 DIAGNOSIS — Z96642 Presence of left artificial hip joint: Secondary | ICD-10-CM

## 2024-02-21 NOTE — Progress Notes (Signed)
 Post-Op Visit Note   Patient: Annette Richard           Date of Birth: 1955/10/29           MRN: [id number] Visit Date: 02/21/2024 PCP: Lawrance Presume, MD   Assessment & Plan:  Chief Complaint:  Chief Complaint  Patient presents with   Left Hip - Follow-up    Left total hip arthroplasty 02/06/2024   Visit Diagnoses:  1. S/P total left hip arthroplasty     Plan: Patient is a pleasant 68 year old female who comes in today 2 weeks status post left total hip replacement 02/06/2024 with subsequent small calcar fracture.  She has been doing well.  She has been compliant touchdown weightbearing with a walker.  She has not been taking anything for pain since this past weekend.  She has been compliant doing a baby aspirin  twice daily for DVT prophylaxis.  Examination of left hip reveals a well-healed surgical incision with nylon sutures in place.  No evidence of infection or cellulitis.  She does have a small seroma.  No skin changes.  Nontender.  Calves are soft and nontender.  She is neurovascularly intact distally.  Today, sutures were removed and Steri-Strips applied.  She will apply ice to the hip as needed.  She may begin 50% weightbearing.  She will follow-up with us  in 4 weeks for repeat evaluation and AP pelvis x-rays.  Call with concerns or questions.  Follow-Up Instructions: Return in about 4 weeks (around 03/20/2024).   Orders:  Orders Placed This Encounter  Procedures   XR HIP UNILAT W OR W/O PELVIS 2-3 VIEWS LEFT   No orders of the defined types were placed in this encounter.   Imaging: XR HIP UNILAT W OR W/O PELVIS 2-3 VIEWS LEFT Result Date: 02/21/2024 X-rays demonstrate a well-seated prosthesis.  Stable cables.   PMFS History: Patient Active Problem List   Diagnosis Date Noted   Status post total replacement of left hip 02/06/2024   Painful orthopaedic hardware (HCC) 11/04/2023   Avascular necrosis of bone of hip, left (HCC) 09/05/2023   Osteopenia of both hips  10/18/2022   Genetic testing 11/13/2021   Family history of pancreatic cancer 10/18/2021   Family history of breast cancer 10/18/2021   Malignant neoplasm of upper-outer quadrant of left breast in female, estrogen receptor positive (HCC) 10/16/2021   Mass of upper inner quadrant of left breast 09/14/2021   Tobacco dependence 09/14/2021   Normocytic anemia 09/14/2021   Vitamin D  deficiency 09/14/2021   Aortic atherosclerosis (HCC) 09/14/2021   Anterior dislocation of left hip (HCC) 07/12/2021   Closed displaced fracture of greater trochanter of left femur (HCC) 07/12/2021   S/p left hip fracture 07/10/2021   Plantar fasciitis 02/19/2018   TIA (transient ischemic attack) 06/26/2016   Mixed hyperlipidemia 03/25/2015   Essential hypertension 03/24/2015   GERD (gastroesophageal reflux disease) 05/12/2011   Environmental allergies 05/12/2011   Past Medical History:  Diagnosis Date   Arthritis    Breast cancer (HCC)    Left Breast   Hyperlipidemia    Hypertension    Osteopenia    Pneumonia    PONV (postoperative nausea and vomiting)    nausea only - after hip surgery 10/2023   Stroke (HCC) 06/26/2016   TIA    Family History  Problem Relation Age of Onset   Pancreatic cancer Mother 68   Mesothelioma Father 27   Breast cancer Sister 40       reports negative  genetic testing   Colon polyps Sister        precancerous   Breast cancer Sister        dx. 30s   Pulmonary fibrosis Brother    CVA Maternal Grandfather    Breast cancer Maternal Great-grandmother     Past Surgical History:  Procedure Laterality Date   ABDOMINAL HYSTERECTOMY     APPENDECTOMY     removed with right salpingoophorectomy   BREAST LUMPECTOMY WITH RADIOACTIVE SEED AND SENTINEL LYMPH NODE BIOPSY Left 11/16/2021   Procedure: LEFT BREAST LUMPECTOMY WITH RADIOACTIVE SEED AND SENTINEL LYMPH NODE BIOPSY;  Surgeon: Sim Dryer, MD;  Location: [facility];  Service: General;  Laterality: Left;    CATARACT EXTRACTION W/ INTRAOCULAR LENS IMPLANT Bilateral 2024   CHOLECYSTECTOMY     2000s   FACIAL LACERATION REPAIR N/A 07/10/2021   Procedure: FACIAL LACERATION REPAIR;  Surgeon: Anda Bamberg, MD;  Location: [facility] OR;  Service: General;  Laterality: N/A;   HARDWARE REMOVAL Left 11/04/2023   Procedure: LEFT HIP HARDWARE REMOVAL, BONEGRAFTING;  Surgeon: Wes Hamman, MD;  Location: [facility] OR;  Service: Orthopedics;  Laterality: Left;   HIP CLOSED REDUCTION Left 07/10/2021   Procedure: ATTEMPTED CLOSED REDUCTION HIP, OPEN REDUCTION LEFT HIP;  Surgeon: Laneta Pintos, MD;  Location: [facility] OR;  Service: Orthopedics;  Laterality: Left;   KNEE ARTHROSCOPY WITH MENISCAL REPAIR Right    SALPINGECTOMY Left    Ectopic Pregnancy   SALPINGOOPHORECTOMY Right    TOTAL HIP ARTHROPLASTY Left 02/06/2024   Procedure: ARTHROPLASTY, HIP, TOTAL, ANTERIOR APPROACH;  Surgeon: Wes Hamman, MD;  Location: [facility] OR;  Service: Orthopedics;  Laterality: Left;  3-C   Social History   Occupational History   Occupation: Retired Public house manager  Tobacco Use   Smoking status: Every Day    Current packs/day: 1.00    Average packs/day: 1 pack/day for 15.0 years (15.0 ttl pk-yrs)    Types: Cigarettes   Smokeless tobacco: Never   Tobacco comments:    Less than one pack of cigarettes as of 10/30/23  Vaping Use   Vaping status: Never Used  Substance and Sexual Activity   Alcohol use: Not Currently    Comment: Maybe once a year   Drug use: Never   Sexual activity: Never    Birth control/protection: Abstinence

## 2024-02-24 ENCOUNTER — Telehealth: Payer: Self-pay | Admitting: *Deleted

## 2024-02-24 NOTE — Progress Notes (Signed)
 Complex Care Management Note  Care Guide Note 02/24/2024 Name: JULIAH SCADDEN MRN: [id number] DOB: May 23, 1956  [name] is a 68 y.o. year old female who sees Lawrance Presume, MD for primary care. I reached out to Raynell Caller by phone today to offer complex care management services.  Ms. Roa was given information about Complex Care Management services today including:   The Complex Care Management services include support from the care team which includes your Nurse Care Manager, Clinical Social Worker, or Pharmacist.  The Complex Care Management team is here to help remove barriers to the health concerns and goals most important to you. Complex Care Management services are voluntary, and the patient may decline or stop services at any time by request to their care team member.   Complex Care Management Consent Status: Patient agreed to services and verbal consent obtained.   Follow up plan:  Telephone appointment with complex care management team member scheduled for:  6/19 with BSW and 6/25 with RNCM   Encounter Outcome:  Patient Scheduled  Barnie Bora  [facility]  [facility], [facility] Guide  Direct Dial: [phone]  Fax [phone]\

## 2024-03-05 ENCOUNTER — Telehealth: Payer: Self-pay

## 2024-03-05 ENCOUNTER — Other Ambulatory Visit: Payer: Self-pay

## 2024-03-05 NOTE — Patient Outreach (Signed)
 SW faxed over Part B of SCAT Application to patient's PCP for completion. SW requested for completed Part B to be faxed to [employer] [place]. SW will work with patient to complete Part A of the application. Part A has been emailed to patient.

## 2024-03-05 NOTE — Telephone Encounter (Signed)
 Home health PT orders efaxed to [facility]

## 2024-03-05 NOTE — Patient Outreach (Signed)
 Complex Care Management   Visit Note  03/05/2024  Name:  Annette Richard MRN: [id number] DOB: January 13, 1956  Situation: Referral received for Complex Care Management related to SDOH Barriers:  Transportation Food insecurity Financial Resource Strain I obtained verbal consent from Patient.  Visit completed with patient  on the phone  Background:   Past Medical History:  Diagnosis Date   Arthritis    Breast cancer (HCC)    Left Breast   Hyperlipidemia    Hypertension    Osteopenia    Pneumonia    PONV (postoperative nausea and vomiting)    nausea only - after hip surgery 10/2023   Stroke (HCC) 06/26/2016   TIA    Assessment:  SW met with Pt over the phone. Pt was alert and cognitive. SDOH were assessed and the following needs were identified and confirmed by Pt: Food insecurity, transportation, and financial resource strain. Pt states she use to receive food stamps but was only receiving a monthly benefit of $23. Pt stated she chose to not move forward application due to low amount. SW encouraged any benefit about is better than nothing and Pt understood but preferred not to apply. SW will be sending food stamps application to Pt for her daughter and son-in-law to apply if they are interested. Pt requested local food pantry resources. SW will provide resources via e-mail on file. Pt states she has been living with her daugther, son-in-law, and grandchildren for almost  3 years. Pt states she pays her daughter rent ($400) and her daughter and son-in-law are responsible for the remaining rent balance and any utility expenses. Pt states she depends on her daughter for transportation, but this has to revolve around her daughters work schedule. Pt confirmed she has missed appointments and/or has to prioritize certain appointments overs due to lack of transportation. SW will work with Pt to complete SCAT Application. Although utilities is not a need, Pt will be receiving a Crisis Intervention  Program application for the household to apply for possible utility assistance through DSS. SW instructed Pt that her daughter or son-in-law would have to apply since utilities are under their name. Pt understood and agreed. SW will mail out applications and e-mail resources for food. Pt confirmed needs and no additional resources needed. Pt was provide direct phone # for SW.   SDOH Interventions    Flowsheet Row Patient Outreach Telephone from 03/05/2024 in [place] [facility]PARTMENT Clinical Support from 02/11/2024 in [facility] [place] - A Dept Of [place]. [facility] Office Visit from 11/18/2023 in [facility] - A Dept Of Tommas Fragmin. [facility] Social Work from 01/02/2022 in [facility] - A Dept Of [place]. [facility] Clinical Support from 10/18/2021 in [facility] - A Dept Of [place]. [facility]  SDOH Interventions       Food Insecurity Interventions Community Resources Provided Intervention Not Indicated Intervention Not Indicated -- Other (Comment)  Annette Richard has SNAP,  Engineer, maintenance, Development worker, international aid fund]  Housing Interventions Intervention Not Indicated Intervention Not Indicated Intervention Not Indicated -- Intervention Not Indicated  Transportation Interventions Community Resources Provided, Forensic psychologist (Research scientist (life sciences)) Programmer, applications Provided [employer] Provided Patient Resources (Friends/Family), Other (Comment)  [[facility], [facility] transportation coordinator] Retail banker, SCAT (Specialized Community Area Transporation)  Utilities Interventions Intervention Not Indicated Intervention Not Indicated -- -- --  Alcohol Usage Interventions --  Intervention Not Indicated (Score <7) Intervention Not Indicated (Score <7) -- --  Depression Interventions/Treatment  -- PHQ2-9 Score <4 Follow-up Not Indicated -- -- --  Financial Strain  Interventions Intervention Not Indicated Other (Comment), Financial Counselor  [employer], Development worker, international aid fund] -- -- Other (Comment), Financial Counselor  Building control surveyor, Development worker, international aid fund]  Physical Activity Interventions -- Intervention Not Indicated -- -- --  Stress Interventions -- Intervention Not Indicated -- -- --  Social Connections Interventions -- Intervention Not Indicated -- -- --  Health Literacy Interventions -- Intervention Not Indicated Intervention Not Indicated -- --     Recommendation:   Pt will complete Part A of SCAT Application. SW will fax part B to PCP.   Follow Up Plan:   Telephone follow up appointment date/time:  03/23/2024 at 10AM.   Burt Casco, BSW [place]/VBCI - [facility] Social Worker [phone]

## 2024-03-05 NOTE — Patient Instructions (Signed)
 Visit Information  Thank you for taking time to visit with me today. Please don't hesitate to contact me if I can be of assistance to you before our next scheduled appointment.  Our next appointment is by telephone on 03/23/2024 at 10AM Please call the care guide team at [phone] if you need to cancel or reschedule your appointment.   Following is a copy of your care plan:   Goals Addressed             This Visit's Progress    BSW VBCI Social Work Care Plan   On track    Problems:   Corporate treasurer , Geophysicist/field seismologist , and Transportation  CSW Clinical Goal(s):   Over the next 2.5 weeks the Patient will work with Child psychotherapist to address concerns related to financial strain, food insecurity, and transportation.  Interventions:  SW will provide Pt local food resources. SW will assist Pt in applying for [employer] [place] and provide application.   Patient Goals/Self-Care Activities:  Complete Part A of SCAT Application.  Plan:   Telephone follow up appointment with care management team member scheduled for:  03/23/2024 at 10AM.         Please call the Suicide and Crisis Lifeline: 988 go to [facility]gent [facility] [address] [place] [phone]) call 911 if you are experiencing a Mental Health or Behavioral Health Crisis or need someone to talk to.  Patient verbalizes understanding of instructions and care plan provided today and agrees to view in MyChart. Active MyChart status and patient understanding of how to access instructions and care plan via MyChart confirmed with patient.     Burt Casco, BSW [place]/VBCI - [employer] Social Worker [phone]

## 2024-03-09 ENCOUNTER — Telehealth: Payer: Self-pay

## 2024-03-09 NOTE — Telephone Encounter (Signed)
 I spoke to the patient and completed Part B of [employer] [place] application and then emailed it to [employer]

## 2024-03-11 ENCOUNTER — Other Ambulatory Visit: Payer: Self-pay

## 2024-03-11 NOTE — Patient Instructions (Signed)
 Visit Information  Thank you for taking time to visit with me today. Please don't hesitate to contact me if I can be of assistance to you before our next scheduled appointment.  Our next appointment is no further scheduled appointments.   Please call the care guide team at [phone] if you need to cancel or reschedule your appointment.   Following is a copy of your care plan:   Goals Addressed             This Visit's Progress    COMPLETED: VBCI RN Care Plan   On track    Problems:  Chronic Disease Management support and education needs related to s/p L hip replacement  Goal: Over the next 30 days the Patient will attend all scheduled medical appointments: ortho as evidenced by completed visit notes uploaded to EMR        work with Child psychotherapist to address Limited access to food and Transportation related to the management of chronic conditions as evidenced by review of electronic medical record and patient or social worker report     Continue to work with PT to improve strength, balance, and coordination  Interventions:   Evaluation of current treatment plan related to s/p L hip replacement, Transportation and Limited access to food self-management and patient's adherence to plan as established by provider. Discussed plans with patient for ongoing care management follow up and provided patient with direct contact information for care management team Evaluation of current treatment plan related to s/p L hip replacement and patient's adherence to plan as established by provider Reviewed medications with patient and discussed PRN medication use Screening for signs and symptoms of depression related to chronic disease state  Assessed social determinant of health barriers  Patient Self-Care Activities:  Attend all scheduled provider appointments Call provider office for new concerns or questions  Perform all self care activities independently  Take medications as prescribed    Work with the social worker to address care coordination needs and will continue to work with the clinical team to address health care and disease management related needs  Plan:  No further follow up required: No nursing needs identified at this time. Patient is recovering well from surgery and taking medications as ordered. Working with BSW to address SDOH needs. Advised patient to contact care team should any needs arise.             Please call the Suicide and Crisis Lifeline: 988 call 1-800-273-TALK (toll free, 24 hour hotline) if you are experiencing a Mental Health or Behavioral Health Crisis or need someone to talk to.  Patient verbalizes understanding of instructions and care plan provided today and agrees to view in MyChart. Active MyChart status and patient understanding of how to access instructions and care plan via MyChart confirmed with patient.     Rosaline Finlay, RN MSN [place]  VBCI Population Health RN Care Manager Direct Dial: [phone]  Fax: [phone]

## 2024-03-11 NOTE — Patient Outreach (Signed)
 Complex Care Management   Visit Note  03/11/2024  Name:  Annette Richard MRN: [id number] DOB: Jun 06, 1956  Situation: Referral received for Complex Care Management related to SDOH Barriers:  Transportation Food insecurity I obtained verbal consent from Patient.  Visit completed with Dagoberto Andrew  on the phone  Background:   Past Medical History:  Diagnosis Date   Arthritis    Breast cancer (HCC)    Left Breast   Hyperlipidemia    Hypertension    Osteopenia    Pneumonia    PONV (postoperative nausea and vomiting)    nausea only - after hip surgery 10/2023   Stroke (HCC) 06/26/2016   TIA    Assessment: Patient Reported Symptoms:  Cognitive Cognitive Status: Able to follow simple commands, Alert and oriented to person, place, and time, Normal speech and language skills Cognitive/Intellectual Conditions Management [RPT]: None reported or documented in medical history or problem list      Neurological Neurological Review of Symptoms: No symptoms reported Neurological Conditions:  (TIA)  HEENT   HEENT Conditions: Vision problem(s), Tooth problem(s) Tooth Problems: missing (Upper dentures that need to be realigned per patient, missing teeth lower back. She does not have dental insurance.) Vision Problems: blindness/vision loss HEENT Management Strategies: Medical device, Routine screening HEENT Comment: Patient had bilateral cataract surgery last year. She wears progressive lenses for up close. Vision problem(s), Tooth problem(s)  Cardiovascular Cardiovascular Symptoms Reported: No symptoms reported Does patient have uncontrolled Hypertension?: No Cardiovascular Conditions: Hypertension, High blood cholesterol Cardiovascular Management Strategies: Medication therapy, Exercise  Respiratory Respiratory Symptoms Reported: No symptoms reported    Endocrine Patient reports the following symptoms related to hypoglycemia or hyperglycemia : No symptoms reported Is patient  diabetic?: No    Gastrointestinal Gastrointestinal Symptoms Reported: No symptoms reported (Last BM yesterday) Additional Gastrointestinal Details: Patient reports appetite is ok. Gastrointestinal Conditions: Reflux/heartburn Gastrointestinal Management Strategies: Exercise, Medication therapy Nutrition Risk Screen (CP): Difficulty chewing/swallowing  Genitourinary Genitourinary Symptoms Reported: No symptoms reported    Integumentary Integumentary Symptoms Reported: No symptoms reported Additional Integumentary Details: L hip incision healed per ortho note 02/21/24. Sutures were removed and steri-strips applied. Patient reports steri-strips are now off. 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 Her last visit with ortho was 02/21/24. She is using muscle relaxer and ice for discomfort. Getting HH PT through [facility] 1x/week. Patient reports significant improvement in pain compared to the beginning of this year. Musculoskeletal Conditions: Osteopenia Musculoskeletal Management Strategies: Exercise, Medical device, Medication therapy Falls in the past year?: Yes Number of falls in past year: 2 or more (No falls in 2025) Was there an injury with Fall?: No Fall Risk Category Calculator: 2 Patient Fall Risk Level: Moderate Fall Risk Patient at Risk for Falls Due to: History of fall(s), Orthopedic patient Fall risk Follow up: Falls evaluation completed, Education provided  Psychosocial Psychosocial Symptoms Reported: No symptoms reported     Quality of Family Relationships: helpful, involved, supportive Do you feel physically threatened by others?: No      03/11/2024    9:35 AM  Depression screen PHQ 2/9  Decreased Interest 0  Down, Depressed,  Hopeless 1  PHQ - 2 Score 1    There were no vitals filed for this visit.  Medications Reviewed Today     Reviewed by Arno Rosaline SQUIBB, RN (Registered Nurse) on  03/11/24 at 0920  Med List Status: <None>   Medication Order Taking? Sig Documenting Provider Last Dose Status Informant  amLODipine  (NORVASC ) 5 MG tablet [id number] Yes Take 1 tablet (5 mg total) by mouth daily. Vicci Barnie NOVAK, MD  Active Self  anastrozole  (ARIMIDEX ) 1 MG tablet [id number] Yes TAKE 1 TABLET BY MOUTH EVERY DAY Causey, Morna Pickle, NP  Active   aspirin  (ASPIRIN  81) 81 MG chewable tablet [id number] Yes Chew 1 tablet (81 mg total) by mouth 2 (two) times daily. To be taken after surgery to prevent blood clots Jule Ronal CROME, PA-C  Active   atorvastatin  (LIPITOR) 10 MG tablet [id number] Yes Take 1 tablet (10 mg total) by mouth daily. Vicci Barnie NOVAK, MD  Active Self  Calcium  Carbonate-Vit D-Min (CALCIUM  1200) 1200-1000 MG-UNIT CHEW [id number] Yes Chew 2 tablets by mouth in the morning. [provider]  Active Self  chlorhexidine  (HIBICLENS ) 4 % external liquid [id number] Yes Apply 15 mLs (1 Application total) topically as directed for 30 doses. Use as directed daily for 5 days every other week for 6 weeks. Jule Ronal CROME, PA-C  Active            Med Note (Angelina Venard P   Wed Mar 11, 2024  9:14 AM)    docusate sodium  (COLACE) 100 MG capsule [id number] Yes Take 1 capsule (100 mg total) by mouth daily as needed. Jule Ronal CROME, PA-C  Active   doxycycline  (VIBRAMYCIN ) 100 MG capsule [id number]  Take 1 capsule (100 mg total) by mouth 2 (two) times daily. To be taken after surgery  Patient not taking: Reported on 03/11/2024   Jule Ronal CROME, PA-C  Active   Multiple Vitamins-Iron  (MULTIVITAMINS WITH IRON ) TABS tablet [id number]  Take 1 tablet by mouth daily.  Patient not taking: Reported on 03/11/2024   Maczis, Michael M, PA-C  Active Self           Med Note CLAUD, MICHEAL ONEIDA Schaumann Oct 24, 2023  12:02 PM) On hold per patient (needs to purchase more)  ondansetron  (ZOFRAN ) 4 MG tablet [id number]  Take 1 tablet (4 mg total) by mouth every 8 (eight) hours as needed for nausea or vomiting. Jule Ronal CROME, PA-C  Active   oxyCODONE -acetaminophen  (PERCOCET) 5-325 MG tablet [id number] Yes Take 1-2 tablets by mouth every 6 (six) hours as needed. To be taken after surgery Jule Ronal CROME, PA-C  Active   sennosides-docusate sodium  (SENOKOT-S) 8.6-50 MG tablet [id number]  Take 1 tablet by mouth every other day.  Patient not taking: Reported on 03/11/2024   [provider]  Active Self           Med Note MARDELL BRADLEY   Tue Feb 11, 2024  1:04 PM) She has not purchased yet  tiZANidine  (ZANAFLEX ) 2 MG tablet [id number] Yes TAKE 1 TO 2 TABLETS (2-4 MG TOTAL) BY MOUTH AT BEDTIME AS NEEDED FOR MUSCLE SPASM Corey, Evan S, MD  Active             Recommendation:   Specialty provider follow-up ortho Continue Current Plan of Care  Follow Up Plan:   No nursing needs identified at this time. Patient is recovering well from surgery and taking medications as ordered. Working with BSW to address SDOH needs. Advised patient to contact care team should any needs arise. No further CMRN follow-up scheduled.  Rosaline Finlay, RN MSN [place]  VBCI Population Health RN Care Manager Direct Dial: [phone]  Fax: [phone]

## 2024-03-17 NOTE — Telephone Encounter (Signed)
 noted

## 2024-03-23 ENCOUNTER — Other Ambulatory Visit: Payer: Self-pay

## 2024-03-23 NOTE — Patient Instructions (Signed)
 Visit Information  Thank you for taking time to visit with me today. Please don't hesitate to contact me if I can be of assistance to you before our next scheduled appointment.  Your next care management appointment is by telephone on 04/20/2024 at 10AM  Telephone follow up appointment date/time:  04/20/2024 at 10AM  Please call the care guide team at [phone] if you need to cancel, schedule, or reschedule an appointment.   Please call the Suicide and Crisis Lifeline: 988 go to [facility] Urgent [facility] [address], [place] [phone]) call 911 if you are experiencing a Mental Health or Behavioral Health Crisis or need someone to talk to.  Annette Richard, BSW [place]/VBCI - [employer] Social Worker [phone]

## 2024-03-23 NOTE — Patient Outreach (Signed)
 Complex Care Management   Visit Note  03/23/2024  Name:  Annette Richard MRN: [id number] DOB: 05-Oct-1955  Situation: Referral received for Complex Care Management related to SDOH Barriers:  Transportation Food insecurity Financial Resource Strain I obtained verbal consent from Patient.  Visit completed with patient  on the phone  Background:   Past Medical History:  Diagnosis Date   Arthritis    Breast cancer (HCC)    Left Breast   Hyperlipidemia    Hypertension    Osteopenia    Pneumonia    PONV (postoperative nausea and vomiting)    nausea only - after hip surgery 10/2023   Stroke (HCC) 06/26/2016   TIA    Assessment: BSW conducted follow-up call with patient. Patient was alert and cognitive. Patient confirmed she received resources via email and mail. Patient states she has not been able to access food resources due to transportation barriers, but will ask Daughter if she can assist with this. Patient confirmed she completed Part B of her SCAT Application with [facility]. Per chart note, Part B was faxed over to [employer] [place]. Patient and BSW completed Part A of application while on the phone and has been faxed over to [employer] for review/processing. In addition, while on the phone patient and BSW completed a referral form for the community support and nutrition program through [employer]. Form was emailed to product director Devere Free ([email or web address]) for review and consideration of their food home delivery program. Patient was provided contact info via email for organization.   SDOH Interventions    Flowsheet Row Patient Outreach Telephone from 03/11/2024 in [place] POPULATION HEALTH DEPARTMENT Patient Outreach Telephone from 03/05/2024 in [place] POPULATION HEALTH DEPARTMENT Clinical Support from 02/11/2024 in [facility] [place] - A Dept Of [place]. [facility] Office Visit from 11/18/2023 in [facility] [place] - A Dept Of Jolynn DEL. [facility] Social Work from 01/02/2022 in [facility] - A Dept Of [place]. [facility] Clinical Support from 10/18/2021 in [facility] - A Dept Of [place]. [facility]  SDOH Interventions        Food Insecurity Interventions Other (Comment)  [BSW to send information for SNAP. Patient reports she does plan to reapply.] [employer] Provided Intervention Not Indicated Intervention Not Indicated -- Other (Comment)  [already has SNAP,  Engineer, maintenance, Development worker, international aid fund]  Housing Interventions Intervention Not Indicated Intervention Not Indicated Intervention Not Indicated Intervention Not Indicated -- Intervention Not Indicated  Transportation Interventions Other (Comment)  [Patient is in the process of applying for [employer] [place]. Working with BSW.] [employer] Provided, [employer] (Research scientist (life sciences)) Programmer, applications Provided [employer] Provided Patient Resources (Friends/Family), Other (Comment)  [ACS Road to Recovery, Insurance risk surveyor transportation coordinator] [employer], [employer] (Research scientist (life sciences))  Utilities Interventions Intervention Not Indicated Intervention Not Indicated Intervention Not Indicated -- -- --  Alcohol Usage Interventions -- -- Intervention Not Indicated (Score <7) Intervention Not Indicated (Score <7) -- --  Depression Interventions/Treatment  -- -- PHQ2-9 Score <4 Follow-up Not Indicated -- -- --  Financial Strain Interventions -- Intervention Not Indicated Other (Comment), Financial Counselor  [employer], Development worker, international aid fund] -- -- Other (Comment), Financial Counselor  Building control surveyor, Development worker, international aid fund]  Physical Activity Interventions -- -- Intervention Not Indicated -- -- --  Stress Interventions -- -- Intervention Not Indicated -- -- --  Social  Connections Interventions -- -- Intervention Not Indicated -- -- --  Health Literacy Interventions -- --  Intervention Not Indicated Intervention Not Indicated -- --    Recommendation:   None at this time.   Follow Up Plan:   Telephone follow up appointment date/time:  04/20/2024 at 10AM  Laymon Doll, [place]/VBCI - [facility] Social Worker [phone]

## 2024-03-24 ENCOUNTER — Ambulatory Visit (INDEPENDENT_AMBULATORY_CARE_PROVIDER_SITE_OTHER): Admitting: Physician Assistant

## 2024-03-24 ENCOUNTER — Other Ambulatory Visit (INDEPENDENT_AMBULATORY_CARE_PROVIDER_SITE_OTHER): Payer: Self-pay

## 2024-03-24 DIAGNOSIS — Z96642 Presence of left artificial hip joint: Secondary | ICD-10-CM | POA: Diagnosis not present

## 2024-03-24 NOTE — Progress Notes (Signed)
 Post-Op Visit Note   Patient: Annette Richard           Date of Birth: 04/05/1956           MRN: [id number] Visit Date: 03/24/2024 PCP: Vicci Barnie NOVAK, MD   Assessment & Plan:  Chief Complaint:  Chief Complaint  Patient presents with   Left Hip - Routine Post Op, Follow-up   Visit Diagnoses:  1. S/P total left hip arthroplasty     Plan: Patient is a pleasant 68 year old female who comes in today 6-week status post left total hip replacement with subsequent calcar fracture 02/06/2024.  She has been doing well.  She has been compliant 50% weightbearing with crutches.  She only notes a little soreness and is not taking anything for pain.  She has been compliant taking a baby aspirin  twice daily for DVT prophylaxis.  She has been getting home health PT and has 2 remaining visits.  Overall, feels good.  Examination of her left hip reveals painless hip flexion and logroll.  She is neurovascularly intact distally.  At this point, she may discontinue her baby aspirin  for DVT prophylaxis.  She may begin full weightbearing to the operative extremity.  She will follow-up with us  in 6 weeks for repeat evaluation and AP pelvis x-rays.  Call with concerns or questions.  Follow-Up Instructions: Return in about 6 weeks (around 05/05/2024).   Orders:  Orders Placed This Encounter  Procedures   XR Pelvis 1-2 Views   No orders of the defined types were placed in this encounter.   Imaging: XR Pelvis 1-2 Views Result Date: 03/24/2024 Well seated prosthesis.  No hardware complication   PMFS History: Patient Active Problem List   Diagnosis Date Noted   Status post total replacement of left hip 02/06/2024   Painful orthopaedic hardware (HCC) 11/04/2023   Avascular necrosis of bone of hip, left (HCC) 09/05/2023   Osteopenia of both hips 10/18/2022   Genetic testing 11/13/2021   Family history of pancreatic cancer 10/18/2021   Family history of breast cancer 10/18/2021   Malignant neoplasm  of upper-outer quadrant of left breast in female, estrogen receptor positive (HCC) 10/16/2021   Mass of upper inner quadrant of left breast 09/14/2021   Tobacco dependence 09/14/2021   Normocytic anemia 09/14/2021   Vitamin D  deficiency 09/14/2021   Aortic atherosclerosis (HCC) 09/14/2021   Anterior dislocation of left hip (HCC) 07/12/2021   Closed displaced fracture of greater trochanter of left femur (HCC) 07/12/2021   S/p left hip fracture 07/10/2021   Plantar fasciitis 02/19/2018   TIA (transient ischemic attack) 06/26/2016   Mixed hyperlipidemia 03/25/2015   Essential hypertension 03/24/2015   GERD (gastroesophageal reflux disease) 05/12/2011   Environmental allergies 05/12/2011   Past Medical History:  Diagnosis Date   Arthritis    Breast cancer (HCC)    Left Breast   Hyperlipidemia    Hypertension    Osteopenia    Pneumonia    PONV (postoperative nausea and vomiting)    nausea only - after hip surgery 10/2023   Stroke (HCC) 06/26/2016   TIA    Family History  Problem Relation Age of Onset   Pancreatic cancer Mother 4   Mesothelioma Father 46   Breast cancer Sister 7       reports negative genetic testing   Colon polyps Sister        precancerous   Breast cancer Sister        dx. 30s   Pulmonary fibrosis  Brother    CVA Maternal Grandfather    Breast cancer Maternal Great-grandmother     Past Surgical History:  Procedure Laterality Date   ABDOMINAL HYSTERECTOMY     APPENDECTOMY     removed with right salpingoophorectomy   BREAST LUMPECTOMY WITH RADIOACTIVE SEED AND SENTINEL LYMPH NODE BIOPSY Left 11/16/2021   Procedure: LEFT BREAST LUMPECTOMY WITH RADIOACTIVE SEED AND SENTINEL LYMPH NODE BIOPSY;  Surgeon: Vanderbilt Ned, MD;  Location: [facility];  Service: General;  Laterality: Left;   CATARACT EXTRACTION W/ INTRAOCULAR LENS IMPLANT Bilateral 2024   CHOLECYSTECTOMY     2000s   FACIAL LACERATION REPAIR N/A 07/10/2021   Procedure: FACIAL  LACERATION REPAIR;  Surgeon: Paola Dreama SAILOR, MD;  Location: [facility] OR;  Service: General;  Laterality: N/A;   HARDWARE REMOVAL Left 11/04/2023   Procedure: LEFT HIP HARDWARE REMOVAL, BONEGRAFTING;  Surgeon: Jerri Kay HERO, MD;  Location: [facility] OR;  Service: Orthopedics;  Laterality: Left;   HIP CLOSED REDUCTION Left 07/10/2021   Procedure: ATTEMPTED CLOSED REDUCTION HIP, OPEN REDUCTION LEFT HIP;  Surgeon: Kendal Franky SQUIBB, MD;  Location: [facility] OR;  Service: Orthopedics;  Laterality: Left;   KNEE ARTHROSCOPY WITH MENISCAL REPAIR Right    SALPINGECTOMY Left    Ectopic Pregnancy   SALPINGOOPHORECTOMY Right    TOTAL HIP ARTHROPLASTY Left 02/06/2024   Procedure: ARTHROPLASTY, HIP, TOTAL, ANTERIOR APPROACH;  Surgeon: Jerri Kay HERO, MD;  Location: [facility] OR;  Service: Orthopedics;  Laterality: Left;  3-C   Social History   Occupational History   Occupation: Retired Public house manager  Tobacco Use   Smoking status: Every Day    Current packs/day: 1.00    Average packs/day: 1 pack/day for 15.0 years (15.0 ttl pk-yrs)    Types: Cigarettes   Smokeless tobacco: Never   Tobacco comments:    Less than one pack of cigarettes as of 10/30/23  Vaping Use   Vaping status: Never Used  Substance and Sexual Activity   Alcohol use: Not Currently    Comment: Maybe once a year   Drug use: Never   Sexual activity: Never    Birth control/protection: Abstinence

## 2024-03-31 ENCOUNTER — Encounter: Payer: Self-pay | Admitting: Orthopaedic Surgery

## 2024-04-20 ENCOUNTER — Other Ambulatory Visit: Payer: Self-pay

## 2024-04-20 NOTE — Patient Outreach (Signed)
 Complex Care Management   Visit Note  04/20/2024  Name:  Annette Richard MRN: [id number] DOB: 1956/05/19  Situation: Referral received for Complex Care Management related to SDOH Barriers:  Transportation Food insecurity Financial Resource Strain I obtained verbal consent from Patient.  Visit completed with patient  on the phone  Background:   Past Medical History:  Diagnosis Date   Arthritis    Breast cancer (HCC)    Left Breast   Hyperlipidemia    Hypertension    Osteopenia    Pneumonia    PONV (postoperative nausea and vomiting)    nausea only - after hip surgery 10/2023   Stroke [facility] 06/26/2016   TIA    Assessment: BSW held f/u with patient. Patient was alert and cognitive and in good mood. Patient reports she did receive food home delivery box from [employer]. Patient was very Designer, multimedia. Patient was informed of distribution time frames and provided contact information via email for future follow-up. BSW also provided patient updated mobile food market schedule for [employer] for Aug 2025 via email. Patient was also provided contact information to two local churches who could assist with financial assistance for rent or utility. Patient confirmed with BSW she was approved and certified to use [employer] [place] services. Patient has planned to visit depot next week to get a punch card to begin using services. Patient is approved for 3 months of rides each year until 2028. Patient was given informational packet on how to reschedule ride and is aware of how to do so. Patient agreed to case closure and was informed on how to get connected with BSW services in the future through her PCP. Patient understood and agreed. No other resources were provided/requested at this time.   SDOH Interventions    Flowsheet Row Patient Outreach Telephone from 04/20/2024 in [place] POPULATION HEALTH DEPARTMENT Patient Outreach Telephone from 03/11/2024 in [place]  POPULATION HEALTH DEPARTMENT Patient Outreach Telephone from 03/05/2024 in [place] POPULATION HEALTH DEPARTMENT Clinical Support from 02/11/2024 in [facility] Health Comm Health [place] - A Dept Of [place]. [facility] Office Visit from 11/18/2023 in [facility] [place] - A Dept Of Jolynn DEL. [facility] Social Work from 01/02/2022 in [facility] - A Dept Of [place]. [facility]  SDOH Interventions        Food Insecurity Interventions Community Resources Provided  [updated food pantries and mobile market scheduled provided to patient via email.] Other (Comment)  [BSW to send information for SNAP. Patient reports she does plan to reapply.] [employer] Provided Intervention Not Indicated Intervention Not Indicated --  Housing Interventions Intervention Not Indicated Intervention Not Indicated Intervention Not Indicated Intervention Not Indicated Intervention Not Indicated --  Transportation Interventions Community Resources Provided, Patient Resources (Friends/Family), SCAT (Specialized Community Area Transporation)  [Patient was certified to use [employer] [place] services until 2028] Other (Comment)  [Patient is in the process of applying for [employer] [place]. Working with BSW.] [employer] Provided, [employer] (Research scientist (life sciences)) Programmer, applications Provided [employer] Provided Patient Resources (Friends/Family), Other (Comment)  [[facility] Road to Recovery, [facility] transportation coordinator]  Utilities Interventions [employer] Provided  [facility] provided via email.] Intervention Not Indicated Intervention Not Indicated Intervention Not Indicated -- --  Alcohol Usage Interventions -- -- -- Intervention Not Indicated (Score <7) Intervention Not Indicated (Score <7) --  Depression Interventions/Treatment  -- -- -- EYV7-0 Score <4 Follow-up Not Indicated -- --  Financial Strain Interventions [employer] Provided --  Intervention Not Indicated Other (Comment), Teacher, adult education, Development worker, international aid fund] -- --  Physical Activity Interventions -- -- -- Intervention Not Indicated -- --  Stress Interventions -- -- -- Intervention Not Indicated -- --  Social Connections Interventions -- -- -- Intervention Not Indicated -- --  Health Literacy Interventions -- -- -- Intervention Not Indicated Intervention Not Indicated --      Recommendation:   None  Follow Up Plan:   Patient has met all care management goals. Care Management case will be closed. Patient has been provided contact information should new needs arise.   Laymon Doll, BSW [place]/VBCI - [employer] Social Worker [phone]

## 2024-04-20 NOTE — Patient Instructions (Signed)

## 2024-05-12 ENCOUNTER — Other Ambulatory Visit (INDEPENDENT_AMBULATORY_CARE_PROVIDER_SITE_OTHER): Payer: Self-pay

## 2024-05-12 ENCOUNTER — Ambulatory Visit (INDEPENDENT_AMBULATORY_CARE_PROVIDER_SITE_OTHER): Admitting: Physician Assistant

## 2024-05-12 DIAGNOSIS — Z96642 Presence of left artificial hip joint: Secondary | ICD-10-CM | POA: Diagnosis not present
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
## 2024-05-14 ENCOUNTER — Ambulatory Visit: Attending: Physician Assistant | Admitting: Rehabilitative and Restorative Service Providers"

## 2024-05-14 ENCOUNTER — Encounter: Payer: Self-pay | Admitting: Rehabilitative and Restorative Service Providers"

## 2024-05-14 ENCOUNTER — Other Ambulatory Visit: Payer: Self-pay

## 2024-05-14 DIAGNOSIS — M6281 Muscle weakness (generalized): Secondary | ICD-10-CM | POA: Diagnosis present

## 2024-05-14 DIAGNOSIS — Z483 Aftercare following surgery for neoplasm: Secondary | ICD-10-CM | POA: Diagnosis present

## 2024-05-14 DIAGNOSIS — M25552 Pain in left hip: Secondary | ICD-10-CM | POA: Insufficient documentation

## 2024-05-14 DIAGNOSIS — Z96642 Presence of left artificial hip joint: Secondary | ICD-10-CM | POA: Diagnosis present

## 2024-05-14 DIAGNOSIS — R262 Difficulty in walking, not elsewhere classified: Secondary | ICD-10-CM | POA: Insufficient documentation

## 2024-05-14 DIAGNOSIS — M5442 Lumbago with sciatica, left side: Secondary | ICD-10-CM | POA: Insufficient documentation

## 2024-05-14 DIAGNOSIS — G8929 Other chronic pain: Secondary | ICD-10-CM | POA: Insufficient documentation

## 2024-05-14 DIAGNOSIS — C50412 Malignant neoplasm of upper-outer quadrant of left female breast: Secondary | ICD-10-CM | POA: Diagnosis present

## 2024-05-14 DIAGNOSIS — Z170 Estrogen receptor positive status [ER+]: Secondary | ICD-10-CM | POA: Diagnosis present
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
## 2024-05-26 ENCOUNTER — Inpatient Hospital Stay: Payer: 59 | Attending: Hematology and Oncology | Admitting: Hematology and Oncology

## 2024-05-26 DIAGNOSIS — G4700 Insomnia, unspecified: Secondary | ICD-10-CM | POA: Insufficient documentation

## 2024-05-26 DIAGNOSIS — Z79899 Other long term (current) drug therapy: Secondary | ICD-10-CM | POA: Insufficient documentation

## 2024-05-26 DIAGNOSIS — Z83719 Family history of colon polyps, unspecified: Secondary | ICD-10-CM | POA: Diagnosis not present

## 2024-05-26 DIAGNOSIS — Z170 Estrogen receptor positive status [ER+]: Secondary | ICD-10-CM | POA: Diagnosis not present

## 2024-05-26 DIAGNOSIS — C50412 Malignant neoplasm of upper-outer quadrant of left female breast: Secondary | ICD-10-CM | POA: Diagnosis present

## 2024-05-26 DIAGNOSIS — Z1721 Progesterone receptor positive status: Secondary | ICD-10-CM | POA: Insufficient documentation

## 2024-05-26 DIAGNOSIS — Z79811 Long term (current) use of aromatase inhibitors: Secondary | ICD-10-CM | POA: Insufficient documentation

## 2024-05-26 DIAGNOSIS — Z800 Family history of malignant neoplasm of digestive organs: Secondary | ICD-10-CM | POA: Diagnosis not present

## 2024-05-26 DIAGNOSIS — Z923 Personal history of irradiation: Secondary | ICD-10-CM | POA: Diagnosis not present

## 2024-05-26 DIAGNOSIS — Z803 Family history of malignant neoplasm of breast: Secondary | ICD-10-CM | POA: Insufficient documentation

## 2024-05-26 DIAGNOSIS — F17210 Nicotine dependence, cigarettes, uncomplicated: Secondary | ICD-10-CM | POA: Diagnosis not present

## 2024-05-26 DIAGNOSIS — M25373 Other instability, unspecified ankle: Secondary | ICD-10-CM | POA: Insufficient documentation

## 2024-05-26 DIAGNOSIS — Z1731 Human epidermal growth factor receptor 2 positive status: Secondary | ICD-10-CM | POA: Diagnosis not present
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
## 2024-05-26 NOTE — Assessment & Plan Note (Signed)
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  Given ER/PR strongly positive and relatively small size tumor with no associated lymph nodes, we agreed to proceed with lumpectomy first followed by adjuvant radiation and Oncotype testing. Oncotype score of 4, no benefit of chemotherapy, distant risk of recurrence at 9 years with AI or Tam alone is 3%.    Fatigue New onset, severe, and intermittent. Possible viral etiology given recent onset and exposure to school-aged grandchild. -Advise rest and hydration. -If symptoms persist for more than 2 weeks, patient to notify the office.  History of Breast Cancer On Anastrozole for over a year with improvement in joint pains and nausea. Last mammogram in February 2024 was normal. Physical exam today unremarkable. -Continue Anastrozole as prescribed. -Next mammogram ordered for 2025. -Follow-up with breast surgeon in February 2025. -Return to clinic in August/September 2025.  Bone Health Last bone density in September 2023. Patient is active, walking daily and performing exercises for mobility. Taking Vitamin D and Calcium. -Continue current activity level and supplements. Repeat bone density in Sep 2025.  Fall Risk Recent fall due to environmental factors, not instability. -No specific intervention needed at this time.

## 2024-05-26 NOTE — Progress Notes (Signed)
 [facility] CONSULT NOTE  Patient Care Team: Vicci Barnie NOVAK, MD as PCP - General (Internal Medicine) Vanderbilt Ned, MD as Consulting Physician (General Surgery) Loretha Ash, MD as Consulting Physician (Hematology and Oncology) Dewey Rush, MD as Consulting Physician (Radiation Oncology)  CHIEF COMPLAINTS/PURPOSE OF CONSULTATION:  Newly diagnosed breast cancer  HISTORY OF PRESENTING ILLNESS:  Annette Richard 68 y.o. female is here because of recent diagnosis of left sided breast cancer.   SUMMARY OF ONCOLOGIC HISTORY: Oncology History  Malignant neoplasm of upper-outer quadrant of left breast in female, estrogen receptor positive (HCC)  10/16/2021 Initial Diagnosis   Malignant neoplasm of upper-outer quadrant of left breast in female, estrogen receptor positive (HCC)   10/18/2021 Cancer Staging   Staging form: Breast, AJCC 8th Edition - Clinical: Stage IB (cT2, cN0, cM0, G2, ER+, PR+, HER2-) - Signed by Lanell Donald Stagger, PA-C on 10/18/2021 Method of lymph node assessment: Clinical Histologic grading system: 3 grade system    Genetic Testing   [facility] CancerNext-Expanded Panel was Negative. Report date is 11/13/2021.  The CancerNext-Expanded gene panel offered by [facility] and includes sequencing, rearrangement, and RNA analysis for the following 77 genes: AIP, ALK, APC, ATM, AXIN2, BAP1, BARD1, BLM, BMPR1A, BRCA1, BRCA2, BRIP1, CDC73, CDH1, CDK4, CDKN1B, CDKN2A, CHEK2, CTNNA1, DICER1, FANCC, FH, FLCN, GALNT12, KIF1B, LZTR1, MAX, MEN1, MET, MLH1, MSH2, MSH3, MSH6, MUTYH, NBN, NF1, NF2, NTHL1, PALB2, PHOX2B, PMS2, POT1, PRKAR1A, PTCH1, PTEN, RAD51C, RAD51D, RB1, RECQL, RET, SDHA, SDHAF2, SDHB, SDHC, SDHD, SMAD4, SMARCA4, SMARCB1, SMARCE1, STK11, SUFU, TMEM127, TP53, TSC1, TSC2, VHL and XRCC2 (sequencing and deletion/duplication); EGFR, EGLN1, HOXB13, KIT, MITF, PDGFRA, POLD1, and POLE (sequencing only); EPCAM and GREM1 (deletion/duplication only).    11/16/2021  Surgery   She underwent left breast seed localized lumpectomy with left axillary sentinel lymph node mapping. Surgical pathology showed invasive ductal carcinoma, grade 2, 3.6 cm in maximal extent, tumor approaches to less than 1 mm from closest resection margin, DCIS, intermediate grade, no metastatic carcinoma in the any of the lymph nodes.  Prior prognostic showed ER 100% positive, strong staining, PR 100% positive, strong staining, HER2 negative and Ki-67 of 5%   12/07/2021 Oncotype testing   Oncotype of 4, group salute chemotherapy benefit less than 1%.  Distant recurrence risk at 9 years with AI or Tam is 3%.   01/09/2022 - 02/07/2022 Radiation Therapy   Site Technique Total Dose (Gy) Dose per Fx (Gy) Completed Fx Beam Energies  Breast, Left: Breast_L 3D 42.56/42.56 2.66 16/16 10X, 6XFFF  Breast, Left: Breast_L_Bst 3D 10/10 2.5 4/4 10X     01/2022 -  Anti-estrogen oral therapy   Anastrozole     Interval history  Discussed the use of AI scribe software for clinical note transcription with the patient, who gave verbal consent to proceed.  History of Present Illness  Annette Richard is a 68 year old female with breast cancer who presents for follow-up.  She has been taking anastrozole  for breast cancer for two and a half years without current side effects. Her last mammogram in March was normal.  She underwent a left hip replacement and has been in rehabilitation three times since December. She is still working on her recovery and recently fell, which led her to use a cane again. Her ankle is unstable, and she has started using a brace to prevent further incidents.  She continues to experience difficulty sleeping, often waking up and unable to return to sleep. To manage this, she engages in activities like  playing games or reading until she feels tired enough to sleep.  Rest of the pertinent 10 point ROS reviewed and negative  MEDICAL HISTORY:  Past Medical History:  Diagnosis Date    Arthritis    Breast cancer (HCC)    Left Breast   Hyperlipidemia    Hypertension    Osteopenia    Pneumonia    PONV (postoperative nausea and vomiting)    nausea only - after hip surgery 10/2023   Stroke (HCC) 06/26/2016   TIA    SURGICAL HISTORY: Past Surgical History:  Procedure Laterality Date   ABDOMINAL HYSTERECTOMY     APPENDECTOMY     removed with right salpingoophorectomy   BREAST LUMPECTOMY WITH RADIOACTIVE SEED AND SENTINEL LYMPH NODE BIOPSY Left 11/16/2021   Procedure: LEFT BREAST LUMPECTOMY WITH RADIOACTIVE SEED AND SENTINEL LYMPH NODE BIOPSY;  Surgeon: Vanderbilt Ned, MD;  Location: [facility];  Service: General;  Laterality: Left;   CATARACT EXTRACTION W/ INTRAOCULAR LENS IMPLANT Bilateral 2024   CHOLECYSTECTOMY     2000s   FACIAL LACERATION REPAIR N/A 07/10/2021   Procedure: FACIAL LACERATION REPAIR;  Surgeon: Paola Dreama SAILOR, MD;  Location: [facility];  Service: General;  Laterality: N/A;   HARDWARE REMOVAL Left 11/04/2023   Procedure: LEFT HIP HARDWARE REMOVAL, BONEGRAFTING;  Surgeon: Jerri Kay HERO, MD;  Location: [facility];  Service: Orthopedics;  Laterality: Left;   HIP CLOSED REDUCTION Left 07/10/2021   Procedure: ATTEMPTED CLOSED REDUCTION HIP, OPEN REDUCTION LEFT HIP;  Surgeon: Kendal Franky SQUIBB, MD;  Location: [facility];  Service: Orthopedics;  Laterality: Left;   KNEE ARTHROSCOPY WITH MENISCAL REPAIR Right    SALPINGECTOMY Left    Ectopic Pregnancy   SALPINGOOPHORECTOMY Right    TOTAL HIP ARTHROPLASTY Left 02/06/2024   Procedure: ARTHROPLASTY, HIP, TOTAL, ANTERIOR APPROACH;  Surgeon: Jerri Kay HERO, MD;  Location: [facility];  Service: Orthopedics;  Laterality: Left;  3-C    SOCIAL HISTORY: Social History   Socioeconomic History   Marital status: Widowed    Spouse name: Not on file   Number of children: 2   Years of education: Not on file   Highest education level: Associate degree: occupational, Scientist, product/process development, or vocational program  Occupational History    Occupation: Retired Public house manager  Tobacco Use   Smoking status: Every Day    Current packs/day: 1.00    Average packs/day: 1 pack/day for 15.0 years (15.0 ttl pk-yrs)    Types: Cigarettes   Smokeless tobacco: Never   Tobacco comments:    Less than one pack of cigarettes as of 10/30/23  Vaping Use   Vaping status: Never Used  Substance and Sexual Activity   Alcohol use: Not Currently    Comment: Maybe once a year   Drug use: Never   Sexual activity: Never    Birth control/protection: Abstinence  Other Topics Concern   Not on file  Social History Narrative   ** Merged History Encounter **       Social Drivers of Health   Financial Resource Strain: Medium Risk (04/20/2024)   Overall Financial Resource Strain (CARDIA)    Difficulty of Paying Living Expenses: Somewhat hard  Food Insecurity: No Food Insecurity (04/20/2024)   Hunger Vital Sign    Worried About Running Out of Food in the Last Year: Never true    Ran Out of Food in the Last Year: Never true  Recent Concern: Food Insecurity - Food Insecurity Present (04/20/2024)   Hunger Vital Sign    Worried  About Running Out of Food in the Last Year: Sometimes true    Ran Out of Food in the Last Year: Sometimes true  Transportation Needs: No Transportation Needs (04/20/2024)   PRAPARE - Administrator, Civil Service (Medical): No    Lack of Transportation (Non-Medical): No  Recent Concern: Transportation Needs - Unmet Transportation Needs (03/11/2024)   PRAPARE - Transportation    Lack of Transportation (Medical): Yes    Lack of Transportation (Non-Medical): Yes  Physical Activity: Insufficiently Active (02/11/2024)   Exercise Vital Sign    Days of Exercise per Week: 2 days    Minutes of Exercise per Session: 50 min  Stress: No Stress Concern Present (02/11/2024)   [employer] of Occupational Health - Occupational Stress Questionnaire    Feeling of Stress : Not at all  Social Connections: Socially Isolated (02/11/2024)    Social Connection and Isolation Panel    Frequency of Communication with Friends and Family: Once a week    Frequency of Social Gatherings with Friends and Family: More than three times a week    Attends Religious Services: Never    Database administrator or Organizations: No    Attends Banker Meetings: Never    Marital Status: Widowed  Intimate Partner Violence: Not At Risk (04/20/2024)   Humiliation, Afraid, Rape, and Kick questionnaire    Fear of Current or Ex-Partner: No    Emotionally Abused: No    Physically Abused: No    Sexually Abused: No    FAMILY HISTORY: Family History  Problem Relation Age of Onset   Pancreatic cancer Mother 41   Mesothelioma Father 74   Breast cancer Sister 36       reports negative genetic testing   Colon polyps Sister        precancerous   Breast cancer Sister        dx. 30s   Pulmonary fibrosis Brother    CVA Maternal Grandfather    Breast cancer Maternal Great-grandmother     ALLERGIES:  has no known allergies.  MEDICATIONS:  Current Outpatient Medications  Medication Sig Dispense Refill   amLODipine  (NORVASC ) 5 MG tablet Take 1 tablet (5 mg total) by mouth daily. 90 tablet 2   anastrozole  (ARIMIDEX ) 1 MG tablet TAKE 1 TABLET BY MOUTH EVERY DAY 90 tablet 3   Calcium  Carbonate-Vit D-Min (CALCIUM  1200) 1200-1000 MG-UNIT CHEW Chew 2 tablets by mouth in the morning.     ondansetron  (ZOFRAN ) 4 MG tablet Take 1 tablet (4 mg total) by mouth every 8 (eight) hours as needed for nausea or vomiting. (Patient not taking: Reported on 05/14/2024) 40 tablet 0   tiZANidine  (ZANAFLEX ) 2 MG tablet TAKE 1 TO 2 TABLETS (2-4 MG TOTAL) BY MOUTH AT BEDTIME AS NEEDED FOR MUSCLE SPASM 180 tablet 0   aspirin  (ASPIRIN  81) 81 MG chewable tablet Chew 1 tablet (81 mg total) by mouth 2 (two) times daily. To be taken after surgery to prevent blood clots (Patient not taking: Reported on 05/14/2024) 84 tablet 0   atorvastatin  (LIPITOR) 10 MG tablet Take 1 tablet  (10 mg total) by mouth daily. (Patient not taking: Reported on 05/26/2024) 90 tablet 2   chlorhexidine  (HIBICLENS ) 4 % external liquid Apply 15 mLs (1 Application total) topically as directed for 30 doses. Use as directed daily for 5 days every other week for 6 weeks. (Patient not taking: Reported on 05/14/2024) 946 mL 1   docusate sodium  (COLACE) 100 MG capsule Take  1 capsule (100 mg total) by mouth daily as needed. (Patient not taking: Reported on 05/26/2024) 30 capsule 2   doxycycline  (VIBRAMYCIN ) 100 MG capsule Take 1 capsule (100 mg total) by mouth 2 (two) times daily. To be taken after surgery (Patient not taking: Reported on 05/14/2024) 20 capsule 0   Multiple Vitamins-Iron  (MULTIVITAMINS WITH IRON ) TABS tablet Take 1 tablet by mouth daily. (Patient not taking: Reported on 05/26/2024)  0   oxyCODONE -acetaminophen  (PERCOCET) 5-325 MG tablet Take 1-2 tablets by mouth every 6 (six) hours as needed. To be taken after surgery (Patient not taking: Reported on 05/14/2024) 40 tablet 0   sennosides-docusate sodium  (SENOKOT-S) 8.6-50 MG tablet Take 1 tablet by mouth every other day. (Patient not taking: Reported on 05/14/2024)     No current facility-administered medications for this visit.    PHYSICAL EXAMINATION: ECOG PERFORMANCE STATUS: 0 - Asymptomatic  Vitals:   05/26/24 1133  BP: 136/79  Pulse: 71  Resp: 20  Temp: 98.6 F (37 C)  SpO2: 98%     Filed Weights   05/26/24 1133  Weight: 163 lb 11.2 oz (74.3 kg)   GENERAL:alert, no distress and comfortable Bilateral breast with no palpable masses, post lumpectomy and radiation changes left breast. No regional adenopathy. She has chronically inverted nipples. CTA bilaterally No LE Edema  LABORATORY DATA:  I have reviewed the data as listed Lab Results  Component Value Date   WBC 7.2 01/28/2024   HGB 14.2 01/28/2024   HCT 42.7 01/28/2024   MCV 85.6 01/28/2024   PLT 259 01/28/2024   Lab Results  Component Value Date   NA 139 01/28/2024    K 3.3 (L) 01/28/2024   CL 106 01/28/2024   CO2 24 01/28/2024    RADIOGRAPHIC STUDIES: I have personally reviewed the radiological reports and agreed with the findings in the report.  ASSESSMENT AND PLAN:  Assessment & Plan Breast cancer on anastrozole  therapy Breast cancer managed with anastrozole  for 2.5 years without significant side effects.  Recent mammogram normal. No concerns on ROS or PE - Continue anastrozole  therapy. - Schedule follow-up mammogram in March.  Post-operative recovery following left hip replacement Recovering from left hip replacement. Undergoing rehabilitation. Recent fall necessitated cane use for stability.  Insomnia Experiencing difficulty with sleep, characterized by waking and inability to return to sleep. This is chronic issue for her.  Ankle instability Ankle instability contributed to recent fall. Using brace for stabilization.  Total time spent: 30 minutes All questions were answered. The patient knows to call the clinic with any problems, questions or concerns.    Amber Stalls, MD 05/26/24

## 2024-05-27 ENCOUNTER — Ambulatory Visit: Attending: Surgery | Admitting: Physical Therapy

## 2024-05-27 ENCOUNTER — Encounter: Payer: Self-pay | Admitting: Physical Therapy

## 2024-05-27 DIAGNOSIS — M6281 Muscle weakness (generalized): Secondary | ICD-10-CM | POA: Insufficient documentation

## 2024-05-27 DIAGNOSIS — R293 Abnormal posture: Secondary | ICD-10-CM | POA: Diagnosis not present

## 2024-05-27 DIAGNOSIS — Z170 Estrogen receptor positive status [ER+]: Secondary | ICD-10-CM | POA: Insufficient documentation

## 2024-05-27 DIAGNOSIS — M5442 Lumbago with sciatica, left side: Secondary | ICD-10-CM | POA: Diagnosis not present

## 2024-05-27 DIAGNOSIS — C50412 Malignant neoplasm of upper-outer quadrant of left female breast: Secondary | ICD-10-CM | POA: Diagnosis not present

## 2024-05-27 DIAGNOSIS — R262 Difficulty in walking, not elsewhere classified: Secondary | ICD-10-CM | POA: Insufficient documentation

## 2024-05-27 DIAGNOSIS — M25552 Pain in left hip: Secondary | ICD-10-CM | POA: Diagnosis present

## 2024-05-27 DIAGNOSIS — G8929 Other chronic pain: Secondary | ICD-10-CM | POA: Insufficient documentation

## 2024-05-29 ENCOUNTER — Encounter

## 2024-05-29 ENCOUNTER — Ambulatory Visit: Admitting: Internal Medicine

## 2024-06-01 ENCOUNTER — Encounter: Payer: Self-pay | Admitting: Rehabilitation

## 2024-06-01 ENCOUNTER — Encounter: Payer: Self-pay | Admitting: Physical Therapy

## 2024-06-01 ENCOUNTER — Ambulatory Visit: Admitting: Physical Therapy

## 2024-06-01 ENCOUNTER — Ambulatory Visit: Admitting: Rehabilitation

## 2024-06-01 DIAGNOSIS — M25552 Pain in left hip: Secondary | ICD-10-CM

## 2024-06-01 DIAGNOSIS — R262 Difficulty in walking, not elsewhere classified: Secondary | ICD-10-CM

## 2024-06-01 DIAGNOSIS — G8929 Other chronic pain: Secondary | ICD-10-CM

## 2024-06-01 DIAGNOSIS — C50412 Malignant neoplasm of upper-outer quadrant of left female breast: Secondary | ICD-10-CM

## 2024-06-01 DIAGNOSIS — Z483 Aftercare following surgery for neoplasm: Secondary | ICD-10-CM

## 2024-06-01 DIAGNOSIS — M6281 Muscle weakness (generalized): Secondary | ICD-10-CM
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
## 2024-06-03 ENCOUNTER — Ambulatory Visit

## 2024-06-03 DIAGNOSIS — G8929 Other chronic pain: Secondary | ICD-10-CM | POA: Diagnosis not present

## 2024-06-03 DIAGNOSIS — M6281 Muscle weakness (generalized): Secondary | ICD-10-CM

## 2024-06-03 DIAGNOSIS — R252 Cramp and spasm: Secondary | ICD-10-CM

## 2024-06-03 DIAGNOSIS — R262 Difficulty in walking, not elsewhere classified: Secondary | ICD-10-CM

## 2024-06-03 DIAGNOSIS — R293 Abnormal posture: Secondary | ICD-10-CM

## 2024-06-03 DIAGNOSIS — M25552 Pain in left hip: Secondary | ICD-10-CM
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
## 2024-06-05 ENCOUNTER — Ambulatory Visit

## 2024-06-05 DIAGNOSIS — G8929 Other chronic pain: Secondary | ICD-10-CM | POA: Diagnosis not present

## 2024-06-05 DIAGNOSIS — R262 Difficulty in walking, not elsewhere classified: Secondary | ICD-10-CM

## 2024-06-05 DIAGNOSIS — M25552 Pain in left hip: Secondary | ICD-10-CM

## 2024-06-05 DIAGNOSIS — M6281 Muscle weakness (generalized): Secondary | ICD-10-CM

## 2024-06-05 DIAGNOSIS — R252 Cramp and spasm: Secondary | ICD-10-CM

## 2024-06-05 DIAGNOSIS — R293 Abnormal posture: Secondary | ICD-10-CM
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
## 2024-06-08 ENCOUNTER — Encounter: Payer: Self-pay | Admitting: Physical Therapy

## 2024-06-08 ENCOUNTER — Ambulatory Visit: Admitting: Physical Therapy

## 2024-06-08 DIAGNOSIS — M6281 Muscle weakness (generalized): Secondary | ICD-10-CM

## 2024-06-08 DIAGNOSIS — R252 Cramp and spasm: Secondary | ICD-10-CM

## 2024-06-08 DIAGNOSIS — R293 Abnormal posture: Secondary | ICD-10-CM

## 2024-06-08 DIAGNOSIS — R262 Difficulty in walking, not elsewhere classified: Secondary | ICD-10-CM

## 2024-06-08 DIAGNOSIS — G8929 Other chronic pain: Secondary | ICD-10-CM | POA: Diagnosis not present

## 2024-06-10 ENCOUNTER — Ambulatory Visit

## 2024-06-10 DIAGNOSIS — G8929 Other chronic pain: Secondary | ICD-10-CM | POA: Diagnosis not present

## 2024-06-10 DIAGNOSIS — M25552 Pain in left hip: Secondary | ICD-10-CM

## 2024-06-10 DIAGNOSIS — R252 Cramp and spasm: Secondary | ICD-10-CM

## 2024-06-10 DIAGNOSIS — M6281 Muscle weakness (generalized): Secondary | ICD-10-CM

## 2024-06-10 DIAGNOSIS — R262 Difficulty in walking, not elsewhere classified: Secondary | ICD-10-CM

## 2024-06-10 NOTE — Telephone Encounter (Signed)
 Message received from [facility] stating the patient has been certified for Access [place]
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
## 2024-06-12 ENCOUNTER — Ambulatory Visit

## 2024-06-12 DIAGNOSIS — M6281 Muscle weakness (generalized): Secondary | ICD-10-CM

## 2024-06-12 DIAGNOSIS — G8929 Other chronic pain: Secondary | ICD-10-CM | POA: Diagnosis not present

## 2024-06-12 DIAGNOSIS — R293 Abnormal posture: Secondary | ICD-10-CM

## 2024-06-12 DIAGNOSIS — R262 Difficulty in walking, not elsewhere classified: Secondary | ICD-10-CM

## 2024-06-12 DIAGNOSIS — R252 Cramp and spasm: Secondary | ICD-10-CM

## 2024-06-12 DIAGNOSIS — M25552 Pain in left hip: Secondary | ICD-10-CM
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
## 2024-06-15 ENCOUNTER — Encounter: Payer: Self-pay | Admitting: Physical Therapy

## 2024-06-15 ENCOUNTER — Ambulatory Visit: Admitting: Physical Therapy

## 2024-06-15 DIAGNOSIS — M25552 Pain in left hip: Secondary | ICD-10-CM

## 2024-06-15 DIAGNOSIS — R252 Cramp and spasm: Secondary | ICD-10-CM

## 2024-06-15 DIAGNOSIS — M6281 Muscle weakness (generalized): Secondary | ICD-10-CM

## 2024-06-15 DIAGNOSIS — R262 Difficulty in walking, not elsewhere classified: Secondary | ICD-10-CM

## 2024-06-15 DIAGNOSIS — G8929 Other chronic pain: Secondary | ICD-10-CM | POA: Diagnosis not present

## 2024-06-18 ENCOUNTER — Encounter: Admitting: Physical Therapy

## 2024-06-23 ENCOUNTER — Ambulatory Visit: Attending: Surgery

## 2024-06-23 DIAGNOSIS — R252 Cramp and spasm: Secondary | ICD-10-CM | POA: Diagnosis present

## 2024-06-23 DIAGNOSIS — G8929 Other chronic pain: Secondary | ICD-10-CM | POA: Insufficient documentation

## 2024-06-23 DIAGNOSIS — M5442 Lumbago with sciatica, left side: Secondary | ICD-10-CM | POA: Diagnosis present

## 2024-06-23 DIAGNOSIS — M25552 Pain in left hip: Secondary | ICD-10-CM | POA: Insufficient documentation

## 2024-06-23 DIAGNOSIS — R262 Difficulty in walking, not elsewhere classified: Secondary | ICD-10-CM | POA: Insufficient documentation

## 2024-06-23 DIAGNOSIS — M6281 Muscle weakness (generalized): Secondary | ICD-10-CM | POA: Diagnosis present

## 2024-06-23 DIAGNOSIS — R293 Abnormal posture: Secondary | ICD-10-CM | POA: Insufficient documentation
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
## 2024-06-25 ENCOUNTER — Ambulatory Visit

## 2024-06-25 DIAGNOSIS — M25552 Pain in left hip: Secondary | ICD-10-CM

## 2024-06-25 DIAGNOSIS — R262 Difficulty in walking, not elsewhere classified: Secondary | ICD-10-CM

## 2024-06-25 DIAGNOSIS — R252 Cramp and spasm: Secondary | ICD-10-CM

## 2024-06-25 DIAGNOSIS — M6281 Muscle weakness (generalized): Secondary | ICD-10-CM

## 2024-06-25 DIAGNOSIS — R293 Abnormal posture: Secondary | ICD-10-CM

## 2024-06-25 DIAGNOSIS — G8929 Other chronic pain: Secondary | ICD-10-CM
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
## 2024-06-29 ENCOUNTER — Ambulatory Visit

## 2024-06-29 DIAGNOSIS — M25552 Pain in left hip: Secondary | ICD-10-CM

## 2024-06-29 DIAGNOSIS — M6281 Muscle weakness (generalized): Secondary | ICD-10-CM

## 2024-06-29 DIAGNOSIS — R262 Difficulty in walking, not elsewhere classified: Secondary | ICD-10-CM
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
## 2024-07-01 ENCOUNTER — Encounter: Payer: Self-pay | Admitting: Physical Therapy

## 2024-07-01 ENCOUNTER — Ambulatory Visit: Admitting: Physical Therapy

## 2024-07-01 DIAGNOSIS — M6281 Muscle weakness (generalized): Secondary | ICD-10-CM

## 2024-07-01 DIAGNOSIS — M25552 Pain in left hip: Secondary | ICD-10-CM

## 2024-07-01 DIAGNOSIS — R252 Cramp and spasm: Secondary | ICD-10-CM

## 2024-07-01 DIAGNOSIS — R262 Difficulty in walking, not elsewhere classified: Secondary | ICD-10-CM
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
## 2024-07-06 ENCOUNTER — Encounter: Payer: Self-pay | Admitting: Physical Therapy

## 2024-07-06 ENCOUNTER — Ambulatory Visit: Admitting: Physical Therapy

## 2024-07-06 DIAGNOSIS — M25552 Pain in left hip: Secondary | ICD-10-CM | POA: Diagnosis not present

## 2024-07-06 DIAGNOSIS — R252 Cramp and spasm: Secondary | ICD-10-CM

## 2024-07-06 DIAGNOSIS — R262 Difficulty in walking, not elsewhere classified: Secondary | ICD-10-CM

## 2024-07-06 DIAGNOSIS — M6281 Muscle weakness (generalized): Secondary | ICD-10-CM
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
## 2024-07-08 ENCOUNTER — Ambulatory Visit: Admitting: Physical Therapy

## 2024-07-08 ENCOUNTER — Encounter: Payer: Self-pay | Admitting: Physical Therapy

## 2024-07-08 DIAGNOSIS — R293 Abnormal posture: Secondary | ICD-10-CM

## 2024-07-08 DIAGNOSIS — R262 Difficulty in walking, not elsewhere classified: Secondary | ICD-10-CM

## 2024-07-08 DIAGNOSIS — M6281 Muscle weakness (generalized): Secondary | ICD-10-CM

## 2024-07-08 DIAGNOSIS — M25552 Pain in left hip: Secondary | ICD-10-CM | POA: Diagnosis not present

## 2024-07-08 DIAGNOSIS — R252 Cramp and spasm: Secondary | ICD-10-CM
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
## 2024-07-13 ENCOUNTER — Ambulatory Visit: Admitting: Physical Therapy

## 2024-07-13 ENCOUNTER — Encounter: Payer: Self-pay | Admitting: Physical Therapy

## 2024-07-13 DIAGNOSIS — M25552 Pain in left hip: Secondary | ICD-10-CM

## 2024-07-13 DIAGNOSIS — R262 Difficulty in walking, not elsewhere classified: Secondary | ICD-10-CM

## 2024-07-13 DIAGNOSIS — R252 Cramp and spasm: Secondary | ICD-10-CM

## 2024-07-13 DIAGNOSIS — M6281 Muscle weakness (generalized): Secondary | ICD-10-CM

## 2024-07-15 ENCOUNTER — Ambulatory Visit: Admitting: Rehabilitative and Restorative Service Providers"

## 2024-07-15 ENCOUNTER — Encounter: Payer: Self-pay | Admitting: Rehabilitative and Restorative Service Providers"

## 2024-07-15 DIAGNOSIS — R252 Cramp and spasm: Secondary | ICD-10-CM

## 2024-07-15 DIAGNOSIS — M6281 Muscle weakness (generalized): Secondary | ICD-10-CM

## 2024-07-15 DIAGNOSIS — M25552 Pain in left hip: Secondary | ICD-10-CM | POA: Diagnosis not present

## 2024-07-15 DIAGNOSIS — R293 Abnormal posture: Secondary | ICD-10-CM

## 2024-07-15 DIAGNOSIS — R262 Difficulty in walking, not elsewhere classified: Secondary | ICD-10-CM
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
## 2024-07-20 ENCOUNTER — Encounter: Payer: Self-pay | Admitting: Radiology

## 2024-07-20 ENCOUNTER — Ambulatory Visit: Attending: Family Medicine | Admitting: Internal Medicine

## 2024-07-20 VITALS — BP 127/82 | HR 80 | Wt 161.2 lb

## 2024-07-20 DIAGNOSIS — M5416 Radiculopathy, lumbar region: Secondary | ICD-10-CM | POA: Insufficient documentation

## 2024-07-20 DIAGNOSIS — F17200 Nicotine dependence, unspecified, uncomplicated: Secondary | ICD-10-CM

## 2024-07-20 DIAGNOSIS — Z79811 Long term (current) use of aromatase inhibitors: Secondary | ICD-10-CM | POA: Insufficient documentation

## 2024-07-20 DIAGNOSIS — E782 Mixed hyperlipidemia: Secondary | ICD-10-CM | POA: Diagnosis not present

## 2024-07-20 DIAGNOSIS — D6489 Other specified anemias: Secondary | ICD-10-CM | POA: Diagnosis not present

## 2024-07-20 DIAGNOSIS — Z170 Estrogen receptor positive status [ER+]: Secondary | ICD-10-CM | POA: Diagnosis not present

## 2024-07-20 DIAGNOSIS — F17210 Nicotine dependence, cigarettes, uncomplicated: Secondary | ICD-10-CM | POA: Diagnosis not present

## 2024-07-20 DIAGNOSIS — E559 Vitamin D deficiency, unspecified: Secondary | ICD-10-CM | POA: Insufficient documentation

## 2024-07-20 DIAGNOSIS — Z9181 History of falling: Secondary | ICD-10-CM | POA: Diagnosis not present

## 2024-07-20 DIAGNOSIS — M5459 Other low back pain: Secondary | ICD-10-CM | POA: Diagnosis present

## 2024-07-20 DIAGNOSIS — I10 Essential (primary) hypertension: Secondary | ICD-10-CM | POA: Diagnosis not present

## 2024-07-20 DIAGNOSIS — Z7982 Long term (current) use of aspirin: Secondary | ICD-10-CM

## 2024-07-20 DIAGNOSIS — M79605 Pain in left leg: Secondary | ICD-10-CM | POA: Diagnosis present

## 2024-07-20 DIAGNOSIS — C50412 Malignant neoplasm of upper-outer quadrant of left female breast: Secondary | ICD-10-CM | POA: Diagnosis not present

## 2024-07-20 DIAGNOSIS — Z79899 Other long term (current) drug therapy: Secondary | ICD-10-CM

## 2024-07-20 DIAGNOSIS — Z96642 Presence of left artificial hip joint: Secondary | ICD-10-CM | POA: Diagnosis not present

## 2024-07-20 DIAGNOSIS — Z7902 Long term (current) use of antithrombotics/antiplatelets: Secondary | ICD-10-CM

## 2024-07-20 DIAGNOSIS — Z2821 Immunization not carried out because of patient refusal: Secondary | ICD-10-CM

## 2024-07-20 DIAGNOSIS — M79604 Pain in right leg: Secondary | ICD-10-CM | POA: Diagnosis present

## 2024-07-20 MED ORDER — VARENICLINE TARTRATE 1 MG PO TABS: 1.0000 mg | ORAL_TABLET | Freq: Two times a day (BID) | ORAL | 1 refills | Status: DC

## 2024-07-20 MED ORDER — VARENICLINE TARTRATE (STARTER) 0.5 MG X 11 & 1 MG X 42 PO TBPK: ORAL_TABLET | ORAL | 0 refills | Status: DC

## 2024-07-20 MED ORDER — TRAMADOL HCL 50 MG PO TABS: 50.0000 mg | ORAL_TABLET | Freq: Three times a day (TID) | ORAL | 0 refills | Status: AC | PRN

## 2024-07-20 NOTE — Patient Instructions (Signed)
  VISIT SUMMARY: Today, you were seen for back pain that radiates to both legs. We discussed your recent total hip replacement and ongoing physical therapy, as well as new symptoms including severe back pain and tingling in your legs. We also reviewed your current medications and smoking cessation efforts.  YOUR PLAN: -LUMBAR RADICULOPATHY: Lumbar radiculopathy is a condition where a nerve in the lower back is irritated or compressed, causing pain and tingling in the legs. We have prescribed tramadol  to be taken three times daily as needed for pain, and you should continue taking ibuprofen  but less frequently. Use a heating pad and consider over-the-counter topical treatments like icy hot rub. Inform your physical therapist about your back issues so they can adjust your therapy. If your symptoms do not improve, we may need to consider an MRI. We have also ordered a blood test to check your kidney function due to the ibuprofen  use.  -LEFT HIP MUSCLE WEAKNESS AFTER TOTAL HIP REPLACEMENT: You are experiencing persistent muscle weakness in your left hip following your total hip replacement. Continue with your physical therapy sessions and inform your therapist about your back issues to avoid worsening your lumbar radiculopathy.  -ESSENTIAL HYPERTENSION: Your blood pressure is slightly above the target range. We will recheck your blood pressure after you have been seated with your back supported. Please monitor your blood pressure once or twice a week and continue your current blood pressure medication.  -MIXED HYPERLIPIDEMIA: Mixed hyperlipidemia is a condition where there are high levels of different types of fats in your blood. Continue taking atorvastatin  as prescribed to manage your cholesterol levels.  -MALIGNANT NEOPLASM OF UPPER-OUTER QUADRANT OF LEFT BREAST, ON ARIMIDEX : You are currently taking Arimidex  for breast cancer treatment and have two and a half years remaining on this medication. Continue  taking Arimidex  as prescribed.  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 We will prescribe a Chantix  starter pack with a gradual dose increase, followed by continuation packs for two to three months to help you quit smoking.  INSTRUCTIONS: Please follow up with your physical therapist and inform them about your back issues. Monitor your blood pressure once or twice a week and keep a record of the readings. If your back pain does not improve, we may need to schedule an MRI. Additionally, we have ordered a blood test to check your kidney function due to the ibuprofen  use. Continue taking your medications as prescribed and follow the plan for smoking cessation with Chantix .                      Contains text generated by Abridge.                                 Contains text generated by Abridge.

## 2024-07-20 NOTE — Progress Notes (Signed)
 Patient ID: AHLIYA GLATT, female    DOB: 1955-11-20  MRN: [id number]  CC: Medical Management of Chronic Issues (Lower back pain that radiates to legs/feet)   Subjective: Annette Richard is a 68 y.o. female who presents for chronic ds management. Her concerns today include:  Patient with history of HTN, tob dep, anemia secondary to trauma, Vit D def.  LT breast CA ERP/PRP/Her2 - (lumpectomy 11/2021.  Plan for XRT and antiestrogen therapy), liver laceration/scalp laceration/left hip anterior dislocation with femoral neck and intertrochanteric fracture fall as a result of trauma 06/20/2021      Discussed the use of AI scribe software for clinical note transcription with the patient, who gave verbal consent to proceed.  History of Present Illness Annette Richard is a 68 year old female who presents for chronic ds management and  back pain radiating to both legs.  She underwent a total hip replacement on her left hip earlier this year and is currently undergoing her third round of physical therapy. She completed nine weeks of home health and physical therapy, followed by outpatient therapy. Despite these efforts, she has difficulty regaining strength in her left thigh muscle, which remains a focus of her therapy.  New onset of back pain began last Tuesday night (7 days ago) on the left side, spreading to both sides and radiating down both legs. The pain is constant and severe, reaching a 10 out of 10 at its worst, and is accompanied by tingling sensations in both legs. Her feet feel 'weird,' as if there is plastic on them, especially when she curls her toes. The pain is exacerbated by lying down and with sitting or standing in one position for any length iod time; is somewhat relieved by walking or changing positions. Heat application provides some relief, but ibuprofen , taken every four hours at a dose of 400 mg, has not been effective.  She mentions that her left leg is now shorter than the  right since surgery, for which she has been given a heel lift. She is uncertain if the leg length discrepancy or her physical therapy exercises, which include challenging activities like wall sitting and leg lifts, are related to her back pain.  Her current medications include amlodipine  for blood pressure, atorvastatin  for cholesterol, and Arimidex  for breast cancer. Reports compliance with these meds. Last LDL was 92.  She has a history of smoking, currently reduced to about five cigarettes a day, and is attempting to quit with previous trials of patches and Chantix , which she found effective until a stressful event caused a relapse. Would like to try Chantix  again.  HM: declined flu shot    Patient Active Problem List   Diagnosis Date Noted   Status post total replacement of left hip 02/06/2024   Painful orthopaedic hardware 11/04/2023   Avascular necrosis of bone of hip, left (HCC) 09/05/2023   Osteopenia of both hips 10/18/2022   Genetic testing 11/13/2021   Family history of pancreatic cancer 10/18/2021   Family history of breast cancer 10/18/2021   Malignant neoplasm of upper-outer quadrant of left breast in female, estrogen receptor positive (HCC) 10/16/2021   Mass of upper inner quadrant of left breast 09/14/2021   Tobacco dependence 09/14/2021   Normocytic anemia 09/14/2021   Vitamin D  deficiency 09/14/2021   Aortic atherosclerosis 09/14/2021   Anterior dislocation of left hip (HCC) 07/12/2021   Closed displaced fracture of greater trochanter of left femur (HCC) 07/12/2021   S/p left hip fracture 07/10/2021  Plantar fasciitis 02/19/2018   TIA (transient ischemic attack) 06/26/2016   Mixed hyperlipidemia 03/25/2015   Essential hypertension 03/24/2015   GERD (gastroesophageal reflux disease) 05/12/2011   Environmental allergies 05/12/2011     Current Outpatient Medications on File Prior to Visit  Medication Sig Dispense Refill   amLODipine  (NORVASC ) 5 MG tablet Take 1  tablet (5 mg total) by mouth daily. 90 tablet 2   atorvastatin  (LIPITOR) 10 MG tablet Take 1 tablet (10 mg total) by mouth daily. 90 tablet 2   Calcium  Carbonate-Vit D-Min (CALCIUM  1200) 1200-1000 MG-UNIT CHEW Chew 2 tablets by mouth in the morning.     docusate sodium  (COLACE) 100 MG capsule Take 1 capsule (100 mg total) by mouth daily as needed. 30 capsule 2   Multiple Vitamins-Iron  (MULTIVITAMINS WITH IRON ) TABS tablet Take 1 tablet by mouth daily.  0   tiZANidine  (ZANAFLEX ) 2 MG tablet TAKE 1 TO 2 TABLETS (2-4 MG TOTAL) BY MOUTH AT BEDTIME AS NEEDED FOR MUSCLE SPASM 180 tablet 0   anastrozole  (ARIMIDEX ) 1 MG tablet TAKE 1 TABLET BY MOUTH EVERY DAY (Patient not taking: Reported on 07/20/2024) 90 tablet 3   aspirin  (ASPIRIN  81) 81 MG chewable tablet Chew 1 tablet (81 mg total) by mouth 2 (two) times daily. To be taken after surgery to prevent blood clots (Patient not taking: Reported on 05/14/2024) 84 tablet 0   sennosides-docusate sodium  (SENOKOT-S) 8.6-50 MG tablet Take 1 tablet by mouth every other day. (Patient not taking: Reported on 05/14/2024)     No current facility-administered medications on file prior to visit.    No Known Allergies  Social History   Socioeconomic History   Marital status: Widowed    Spouse name: Not on file   Number of children: 2   Years of education: Not on file   Highest education level: Associate degree: occupational, scientist, product/process development, or vocational program  Occupational History   Occupation: Retired PUBLIC HOUSE MANAGER  Tobacco Use   Smoking status: Every Day    Current packs/day: 1.00    Average packs/day: 1 pack/day for 15.0 years (15.0 ttl pk-yrs)    Types: Cigarettes   Smokeless tobacco: Never   Tobacco comments:    Less than one pack of cigarettes as of 10/30/23  Vaping Use   Vaping status: Never Used  Substance and Sexual Activity   Alcohol use: Not Currently    Comment: Maybe once a year   Drug use: Never   Sexual activity: Never    Birth control/protection:  Abstinence  Other Topics Concern   Not on file  Social History Narrative   ** Merged History Encounter **       Social Drivers of Health   Financial Resource Strain: High Risk (07/20/2024)   Overall Financial Resource Strain (CARDIA)    Difficulty of Paying Living Expenses: Very hard  Food Insecurity: Food Insecurity Present (07/20/2024)   Hunger Vital Sign    Worried About Running Out of Food in the Last Year: Often true    Ran Out of Food in the Last Year: Often true  Transportation Needs: Unmet Transportation Needs (07/20/2024)   PRAPARE - Administrator, Civil Service (Medical): Yes    Lack of Transportation (Non-Medical): Yes  Physical Activity: Sufficiently Active (07/20/2024)   Exercise Vital Sign    Days of Exercise per Week: 5 days    Minutes of Exercise per Session: 40 min  Stress: No Stress Concern Present (07/20/2024)   [employer] of Occupational Health - Occupational  Stress Questionnaire    Feeling of Stress: Only a little  Social Connections: Socially Isolated (07/20/2024)   Social Connection and Isolation Panel    Frequency of Communication with Friends and Family: Never    Frequency of Social Gatherings with Friends and Family: Never    Attends Religious Services: Never    Database Administrator or Organizations: No    Attends Engineer, Structural: Not on file    Marital Status: Widowed  Intimate Partner Violence: Not At Risk (04/20/2024)   Humiliation, Afraid, Rape, and Kick questionnaire    Fear of Current or Ex-Partner: No    Emotionally Abused: No    Physically Abused: No    Sexually Abused: No    Family History  Problem Relation Age of Onset   Pancreatic cancer Mother 41   Mesothelioma Father 78   Breast cancer Sister 8       reports negative genetic testing   Colon polyps Sister        precancerous   Breast cancer Sister        dx. 30s   Pulmonary fibrosis Brother    CVA Maternal Grandfather    Breast cancer Maternal  Great-grandmother     Past Surgical History:  Procedure Laterality Date   ABDOMINAL HYSTERECTOMY     APPENDECTOMY     removed with right salpingoophorectomy   BREAST LUMPECTOMY WITH RADIOACTIVE SEED AND SENTINEL LYMPH NODE BIOPSY Left 11/16/2021   Procedure: LEFT BREAST LUMPECTOMY WITH RADIOACTIVE SEED AND SENTINEL LYMPH NODE BIOPSY;  Surgeon: Vanderbilt Ned, MD;  Location: [facility];  Service: General;  Laterality: Left;   CATARACT EXTRACTION W/ INTRAOCULAR LENS IMPLANT Bilateral 2024   CHOLECYSTECTOMY     2000s   FACIAL LACERATION REPAIR N/A 07/10/2021   Procedure: FACIAL LACERATION REPAIR;  Surgeon: Paola Dreama SAILOR, MD;  Location: [facility];  Service: General;  Laterality: N/A;   HARDWARE REMOVAL Left 11/04/2023   Procedure: LEFT HIP HARDWARE REMOVAL, BONEGRAFTING;  Surgeon: Jerri Kay HERO, MD;  Location: [facility];  Service: Orthopedics;  Laterality: Left;   HIP CLOSED REDUCTION Left 07/10/2021   Procedure: ATTEMPTED CLOSED REDUCTION HIP, OPEN REDUCTION LEFT HIP;  Surgeon: Kendal Franky SQUIBB, MD;  Location: [facility];  Service: Orthopedics;  Laterality: Left;   KNEE ARTHROSCOPY WITH MENISCAL REPAIR Right    SALPINGECTOMY Left    Ectopic Pregnancy   SALPINGOOPHORECTOMY Right    TOTAL HIP ARTHROPLASTY Left 02/06/2024   Procedure: ARTHROPLASTY, HIP, TOTAL, ANTERIOR APPROACH;  Surgeon: Jerri Kay HERO, MD;  Location: [facility];  Service: Orthopedics;  Laterality: Left;  3-C    ROS: Review of Systems Negative except as stated above  PHYSICAL EXAM: BP 127/82   Pulse 80   Wt 161 lb 3.2 oz (73.1 kg)   SpO2 99%   BMI 26.02 kg/m   Physical Exam  General appearance - alert, well appearing, and in no distress Mental status - normal mood, behavior, speech, dress, motor activity, and thought processes Neck - supple, no significant adenopathy Chest - clear to auscultation, no wheezes, rales or rhonchi, symmetric air entry Heart - normal rate, regular rhythm, normal S1, S2, no murmurs,  rubs, clicks or gallops Extremities - peripheral pulses normal, no pedal edema, no clubbing or cyanosis MSK: Slight tenderness on palpation of the paraspinal muscles in the lumbar region.  No tenderness on palpation over the lumbar spine. Neuro: Power right lower extremity 5/5 extremely and distally.  Power left lower extremity 4/5 proximally  and distally.  Knee jerk reflex decreased both sides.  Patient ambulates without assistive device.  She is able to transfer from exam bed to chair independently.     Latest Ref Rng & Units 01/28/2024   11:00 AM 11/18/2023    3:24 PM 10/30/2023   10:23 AM  CMP  Glucose 70 - 99 mg/dL 891  86  888   BUN 8 - 23 mg/dL 7  9  11    Creatinine 0.44 - 1.00 mg/dL 9.09  9.13  9.21   Sodium 135 - 145 mmol/L 139  143  140   Potassium 3.5 - 5.1 mmol/L 3.3  4.4  3.4   Chloride 98 - 111 mmol/L 106  105  101   CO2 22 - 32 mmol/L 24  23  24    Calcium  8.9 - 10.3 mg/dL 9.5  9.7  89.6   Total Protein 6.0 - 8.5 g/dL  6.9    Total Bilirubin 0.0 - 1.2 mg/dL  0.3    Alkaline Phos 44 - 121 IU/L  136    AST 0 - 40 IU/L  14    ALT 0 - 32 IU/L  10     Lipid Panel     Component Value Date/Time   CHOL 166 11/18/2023 1524   TRIG 136 11/18/2023 1524   HDL 50 11/18/2023 1524   CHOLHDL 3.3 11/18/2023 1524   CHOLHDL 4.9 06/27/2016 0522   VLDL 23 06/27/2016 0522   LDLCALC 92 11/18/2023 1524    CBC    Component Value Date/Time   WBC 7.2 01/28/2024 1100   RBC 4.99 01/28/2024 1100   HGB 14.2 01/28/2024 1100   HGB 14.6 11/29/2022 1317   HGB 13.9 09/14/2021 1042   HCT 42.7 01/28/2024 1100   HCT 42.9 09/14/2021 1042   PLT 259 01/28/2024 1100   PLT 268 11/29/2022 1317   PLT 354 09/14/2021 1042   MCV 85.6 01/28/2024 1100   MCV 88 09/14/2021 1042   MCV 87 02/14/2014 0359   MCH 28.5 01/28/2024 1100   MCHC 33.3 01/28/2024 1100   RDW 13.4 01/28/2024 1100   RDW 13.3 09/14/2021 1042   RDW 13.5 02/14/2014 0359   LYMPHSABS 2.0 11/29/2022 1317   LYMPHSABS 1.3 02/14/2014 0359    MONOABS 0.6 11/29/2022 1317   MONOABS 0.5 02/14/2014 0359   EOSABS 0.2 11/29/2022 1317   EOSABS 0.1 02/14/2014 0359   BASOSABS 0.1 11/29/2022 1317   BASOSABS 0.0 02/14/2014 0359    ASSESSMENT AND PLAN: 1. Lumbar radiculopathy (Primary) Patient presenting with lumbar radiculopathy for the past 6 to 7 days.  She is not sure of any initiating factors besides some physical therapy maneuvers.  Advised patient that when she goes to physical therapy this week, she should let the therapist know that she has been diagnosed with lumbar radiculopathy so that modifications can be made to maneuvers so as not to make the situation worse. -Purchase IcyHot rub over-the-counter and use it as needed. -Continue using heating pad Continue ibuprofen  but changed to 400 mg 3 times a day as needed.  We will add tramadol  to use 3 times a day as needed for limited time.  Patient advised that the tramadol  is a controlled substance that can cause drowsiness.  [place]  controlled substance reporting system reviewed.  Follow-up in 6 weeks.  If no improvement at that time she will need an MRI. - traMADol  (ULTRAM ) 50 MG tablet; Take 1 tablet (50 mg total) by mouth every 8 (eight) hours  as needed.  Dispense: 30 tablet; Refill: 0  2. Essential hypertension Not at goal but repeat blood pressure closer to goal.  Continue amlodipine  5 mg daily.  Advised to check blood pressure at least once a week with goal being 130/80 or lower. - Basic Metabolic Panel  3. Mixed hyperlipidemia Continue atorvastatin  10 mg daily  4. Tobacco dependence Advised to quit.  Patient is wanting to give a trial of quitting again.  She would like to try the Chantix  again.  We will send the starter pack and continuation pack to her pharmacy. - Varenicline  Tartrate, Starter, (CHANTIX  STARTING MONTH PAK) 0.5 MG X 11 & 1 MG X 42 TBPK; 0.5 mg PO daily day 1-3, then BID day 4-7 then 1 tab BID  Dispense: 53 each; Refill: 0  5. Malignant neoplasm of  upper-outer quadrant of left breast in female, estrogen receptor positive (HCC) On Arimidex  for breast cancer treatment with two and a half years remaining. - Continue Arimidex  as prescribed by her oncologist.  6. Influenza vaccination declined   Patient was given the opportunity to ask questions.  Patient verbalized understanding of the plan and was able to repeat key elements of the plan.   This documentation was completed using Paediatric nurse.  Any transcriptional errors are unintentional.  Orders Placed This Encounter  Procedures   Basic Metabolic Panel     Requested Prescriptions   Signed Prescriptions Disp Refills   traMADol  (ULTRAM ) 50 MG tablet 30 tablet 0    Sig: Take 1 tablet (50 mg total) by mouth every 8 (eight) hours as needed.   Varenicline  Tartrate, Starter, (CHANTIX  STARTING MONTH PAK) 0.5 MG X 11 & 1 MG X 42 TBPK 53 each 0    Sig: 0.5 mg PO daily day 1-3, then BID day 4-7 then 1 tab BID    Return in about 6 weeks (around 08/31/2024).  Annette Louder, MD, FACP

## 2024-07-21 ENCOUNTER — Ambulatory Visit: Payer: Self-pay | Admitting: Internal Medicine

## 2024-07-21 LAB — BASIC METABOLIC PANEL WITH GFR
BUN/Creatinine Ratio: 17 (ref 12–28)
BUN: 14 mg/dL (ref 8–27)
CO2: 22 mmol/L (ref 20–29)
Calcium: 10.3 mg/dL (ref 8.7–10.3)
Chloride: 106 mmol/L (ref 96–106)
Creatinine, Ser: 0.81 mg/dL (ref 0.57–1.00)
Glucose: 86 mg/dL (ref 70–99)
Potassium: 3.9 mmol/L (ref 3.5–5.2)
Sodium: 144 mmol/L (ref 134–144)
eGFR: 80 mL/min/1.73 (ref >59)

## 2024-07-22 ENCOUNTER — Ambulatory Visit: Admitting: Physical Therapy

## 2024-07-24 ENCOUNTER — Ambulatory Visit: Attending: Surgery

## 2024-07-24 DIAGNOSIS — R252 Cramp and spasm: Secondary | ICD-10-CM | POA: Insufficient documentation

## 2024-07-24 DIAGNOSIS — M6281 Muscle weakness (generalized): Secondary | ICD-10-CM | POA: Insufficient documentation

## 2024-07-24 DIAGNOSIS — M5442 Lumbago with sciatica, left side: Secondary | ICD-10-CM | POA: Insufficient documentation

## 2024-07-24 DIAGNOSIS — G8929 Other chronic pain: Secondary | ICD-10-CM | POA: Diagnosis present

## 2024-07-24 DIAGNOSIS — R293 Abnormal posture: Secondary | ICD-10-CM | POA: Diagnosis present

## 2024-07-24 DIAGNOSIS — M25552 Pain in left hip: Secondary | ICD-10-CM | POA: Insufficient documentation

## 2024-07-24 DIAGNOSIS — R262 Difficulty in walking, not elsewhere classified: Secondary | ICD-10-CM | POA: Insufficient documentation
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
## 2024-07-28 ENCOUNTER — Ambulatory Visit: Admitting: Rehabilitative and Restorative Service Providers"

## 2024-07-28 ENCOUNTER — Encounter: Payer: Self-pay | Admitting: Rehabilitative and Restorative Service Providers"

## 2024-07-28 DIAGNOSIS — R293 Abnormal posture: Secondary | ICD-10-CM

## 2024-07-28 DIAGNOSIS — G8929 Other chronic pain: Secondary | ICD-10-CM

## 2024-07-28 DIAGNOSIS — M25552 Pain in left hip: Secondary | ICD-10-CM | POA: Diagnosis not present

## 2024-07-28 DIAGNOSIS — M6281 Muscle weakness (generalized): Secondary | ICD-10-CM

## 2024-07-28 DIAGNOSIS — R262 Difficulty in walking, not elsewhere classified: Secondary | ICD-10-CM
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
## 2024-07-30 ENCOUNTER — Ambulatory Visit

## 2024-07-30 DIAGNOSIS — R293 Abnormal posture: Secondary | ICD-10-CM

## 2024-07-30 DIAGNOSIS — G8929 Other chronic pain: Secondary | ICD-10-CM

## 2024-07-30 DIAGNOSIS — R252 Cramp and spasm: Secondary | ICD-10-CM

## 2024-07-30 DIAGNOSIS — M6281 Muscle weakness (generalized): Secondary | ICD-10-CM

## 2024-07-30 DIAGNOSIS — M25552 Pain in left hip: Secondary | ICD-10-CM | POA: Diagnosis not present

## 2024-07-30 DIAGNOSIS — R262 Difficulty in walking, not elsewhere classified: Secondary | ICD-10-CM
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
## 2024-08-04 ENCOUNTER — Ambulatory Visit

## 2024-08-04 DIAGNOSIS — R252 Cramp and spasm: Secondary | ICD-10-CM

## 2024-08-04 DIAGNOSIS — M25552 Pain in left hip: Secondary | ICD-10-CM | POA: Diagnosis not present

## 2024-08-04 DIAGNOSIS — G8929 Other chronic pain: Secondary | ICD-10-CM

## 2024-08-04 DIAGNOSIS — R293 Abnormal posture: Secondary | ICD-10-CM

## 2024-08-04 DIAGNOSIS — R262 Difficulty in walking, not elsewhere classified: Secondary | ICD-10-CM

## 2024-08-04 DIAGNOSIS — M6281 Muscle weakness (generalized): Secondary | ICD-10-CM
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
## 2024-08-06 ENCOUNTER — Encounter: Payer: Self-pay | Admitting: Rehabilitative and Restorative Service Providers"

## 2024-08-06 ENCOUNTER — Ambulatory Visit: Admitting: Rehabilitative and Restorative Service Providers"

## 2024-08-06 DIAGNOSIS — M6281 Muscle weakness (generalized): Secondary | ICD-10-CM

## 2024-08-06 DIAGNOSIS — M25552 Pain in left hip: Secondary | ICD-10-CM | POA: Diagnosis not present

## 2024-08-06 DIAGNOSIS — R252 Cramp and spasm: Secondary | ICD-10-CM

## 2024-08-06 DIAGNOSIS — R293 Abnormal posture: Secondary | ICD-10-CM

## 2024-08-06 DIAGNOSIS — R262 Difficulty in walking, not elsewhere classified: Secondary | ICD-10-CM
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
## 2024-08-11 ENCOUNTER — Ambulatory Visit

## 2024-08-11 DIAGNOSIS — R293 Abnormal posture: Secondary | ICD-10-CM

## 2024-08-11 DIAGNOSIS — R262 Difficulty in walking, not elsewhere classified: Secondary | ICD-10-CM

## 2024-08-11 DIAGNOSIS — M25552 Pain in left hip: Secondary | ICD-10-CM

## 2024-08-11 DIAGNOSIS — G8929 Other chronic pain: Secondary | ICD-10-CM

## 2024-08-11 DIAGNOSIS — R252 Cramp and spasm: Secondary | ICD-10-CM

## 2024-08-11 DIAGNOSIS — M6281 Muscle weakness (generalized): Secondary | ICD-10-CM
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
## 2024-08-12 ENCOUNTER — Other Ambulatory Visit: Payer: Self-pay | Admitting: Internal Medicine

## 2024-08-12 DIAGNOSIS — F17200 Nicotine dependence, unspecified, uncomplicated: Secondary | ICD-10-CM

## 2024-08-17 ENCOUNTER — Ambulatory Visit: Attending: Surgery

## 2024-08-17 DIAGNOSIS — R262 Difficulty in walking, not elsewhere classified: Secondary | ICD-10-CM | POA: Diagnosis present

## 2024-08-17 DIAGNOSIS — R293 Abnormal posture: Secondary | ICD-10-CM | POA: Insufficient documentation

## 2024-08-17 DIAGNOSIS — R252 Cramp and spasm: Secondary | ICD-10-CM | POA: Insufficient documentation

## 2024-08-17 DIAGNOSIS — M5442 Lumbago with sciatica, left side: Secondary | ICD-10-CM | POA: Insufficient documentation

## 2024-08-17 DIAGNOSIS — M25552 Pain in left hip: Secondary | ICD-10-CM | POA: Diagnosis present

## 2024-08-17 DIAGNOSIS — G8929 Other chronic pain: Secondary | ICD-10-CM | POA: Diagnosis present

## 2024-08-17 DIAGNOSIS — M6281 Muscle weakness (generalized): Secondary | ICD-10-CM | POA: Insufficient documentation
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
## 2024-08-18 ENCOUNTER — Ambulatory Visit: Admitting: Physician Assistant

## 2024-08-18 ENCOUNTER — Other Ambulatory Visit: Payer: Self-pay

## 2024-08-18 DIAGNOSIS — Z96642 Presence of left artificial hip joint: Secondary | ICD-10-CM

## 2024-08-18 NOTE — Progress Notes (Signed)
 Post-Op Visit Note   Patient: Annette Richard           Date of Birth: Jun 19, 1956           MRN: [id number] Visit Date: 08/18/2024 PCP: Vicci Barnie NOVAK, MD   Assessment & Plan:  Chief Complaint:  Chief Complaint  Patient presents with   Left Hip - Follow-up    Left THA 02/06/2024   Visit Diagnoses:  1. S/P total left hip arthroplasty     Plan: Patient is a pleasant 68 year old female who comes in today 7 months status post left total hip replacement 02/06/2024.  She has been doing much better.  She still notes occasional tightness in the groin which she notes is loosened up after going to PT.  She is still in physical therapy making good progress.  Examination of her left hip reveals painless hip flexion and logroll.  She is neurovascularly intact distally.  At this point, she will wean to home exercise program when ready.  Follow-up in 6 months for repeat evaluation and AP pelvis x-rays.  Call with concerns or questions.  Follow-Up Instructions: Return in about 5 months (around 01/16/2025).   Orders:  Orders Placed This Encounter  Procedures   XR Pelvis 1-2 Views   No orders of the defined types were placed in this encounter.   Imaging: XR Pelvis 1-2 Views Result Date: 08/18/2024 Well-seated prosthesis without complication.  Stable hardware.   PMFS History: Patient Active Problem List   Diagnosis Date Noted   Status post total replacement of left hip 02/06/2024   Painful orthopaedic hardware 11/04/2023   Avascular necrosis of bone of hip, left (HCC) 09/05/2023   Osteopenia of both hips 10/18/2022   Genetic testing 11/13/2021   Family history of pancreatic cancer 10/18/2021   Family history of breast cancer 10/18/2021   Malignant neoplasm of upper-outer quadrant of left breast in female, estrogen receptor positive (HCC) 10/16/2021   Mass of upper inner quadrant of left breast 09/14/2021   Tobacco dependence 09/14/2021   Normocytic anemia 09/14/2021   Vitamin D   deficiency 09/14/2021   Aortic atherosclerosis 09/14/2021   Anterior dislocation of left hip (HCC) 07/12/2021   Closed displaced fracture of greater trochanter of left femur (HCC) 07/12/2021   S/p left hip fracture 07/10/2021   Plantar fasciitis 02/19/2018   TIA (transient ischemic attack) 06/26/2016   Mixed hyperlipidemia 03/25/2015   Essential hypertension 03/24/2015   GERD (gastroesophageal reflux disease) 05/12/2011   Environmental allergies 05/12/2011   Past Medical History:  Diagnosis Date   Arthritis    Breast cancer (HCC)    Left Breast   Hyperlipidemia    Hypertension    Osteopenia    Pneumonia    PONV (postoperative nausea and vomiting)    nausea only - after hip surgery 10/2023   Stroke (HCC) 06/26/2016   TIA    Family History  Problem Relation Age of Onset   Pancreatic cancer Mother 18   Mesothelioma Father 70   Breast cancer Sister 70       reports negative genetic testing   Colon polyps Sister        precancerous   Breast cancer Sister        dx. 30s   Pulmonary fibrosis Brother    CVA Maternal Grandfather    Breast cancer Maternal Great-grandmother     Past Surgical History:  Procedure Laterality Date   ABDOMINAL HYSTERECTOMY     APPENDECTOMY     removed with  right salpingoophorectomy   BREAST LUMPECTOMY WITH RADIOACTIVE SEED AND SENTINEL LYMPH NODE BIOPSY Left 11/16/2021   Procedure: LEFT BREAST LUMPECTOMY WITH RADIOACTIVE SEED AND SENTINEL LYMPH NODE BIOPSY;  Surgeon: Vanderbilt Ned, MD;  Location: [facility];  Service: General;  Laterality: Left;   CATARACT EXTRACTION W/ INTRAOCULAR LENS IMPLANT Bilateral 2024   CHOLECYSTECTOMY     2000s   FACIAL LACERATION REPAIR N/A 07/10/2021   Procedure: FACIAL LACERATION REPAIR;  Surgeon: Paola Dreama SAILOR, MD;  Location: [facility];  Service: General;  Laterality: N/A;   HARDWARE REMOVAL Left 11/04/2023   Procedure: LEFT HIP HARDWARE REMOVAL, BONEGRAFTING;  Surgeon: Jerri Kay HERO, MD;  Location: [facility];  Service: Orthopedics;  Laterality: Left;   HIP CLOSED REDUCTION Left 07/10/2021   Procedure: ATTEMPTED CLOSED REDUCTION HIP, OPEN REDUCTION LEFT HIP;  Surgeon: Kendal Franky SQUIBB, MD;  Location: [facility];  Service: Orthopedics;  Laterality: Left;   KNEE ARTHROSCOPY WITH MENISCAL REPAIR Right    SALPINGECTOMY Left    Ectopic Pregnancy   SALPINGOOPHORECTOMY Right    TOTAL HIP ARTHROPLASTY Left 02/06/2024   Procedure: ARTHROPLASTY, HIP, TOTAL, ANTERIOR APPROACH;  Surgeon: Jerri Kay HERO, MD;  Location: [facility];  Service: Orthopedics;  Laterality: Left;  3-C   Social History   Occupational History   Occupation: Retired PUBLIC HOUSE MANAGER  Tobacco Use   Smoking status: Every Day    Current packs/day: 1.00    Average packs/day: 1 pack/day for 15.0 years (15.0 ttl pk-yrs)    Types: Cigarettes   Smokeless tobacco: Never   Tobacco comments:    Less than one pack of cigarettes as of 10/30/23  Vaping Use   Vaping status: Never Used  Substance and Sexual Activity   Alcohol use: Not Currently    Comment: Maybe once a year   Drug use: Never   Sexual activity: Never    Birth control/protection: Abstinence
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
## 2024-08-20 ENCOUNTER — Encounter: Payer: Self-pay | Admitting: Physical Therapy

## 2024-08-20 ENCOUNTER — Ambulatory Visit: Admitting: Physical Therapy

## 2024-08-20 DIAGNOSIS — R293 Abnormal posture: Secondary | ICD-10-CM

## 2024-08-20 DIAGNOSIS — M6281 Muscle weakness (generalized): Secondary | ICD-10-CM

## 2024-08-20 DIAGNOSIS — M25552 Pain in left hip: Secondary | ICD-10-CM

## 2024-08-20 DIAGNOSIS — R262 Difficulty in walking, not elsewhere classified: Secondary | ICD-10-CM

## 2024-09-01 ENCOUNTER — Ambulatory Visit: Admitting: Internal Medicine

## 2024-09-25 ENCOUNTER — Encounter: Payer: Self-pay | Admitting: Hematology and Oncology

## 2024-09-25 ENCOUNTER — Telehealth: Payer: Self-pay | Admitting: *Deleted

## 2024-09-25 NOTE — Telephone Encounter (Signed)
 Pt sent mychart message c/o breast lump with discomfort in left breast. No redness but appears to be larger. Pt has been scheduled with [facility] for 1/16 at 120pm. Pt aware of appt time and date. Advised to call within 24 hours to cancel appt if she isn't able to come.

## 2024-10-02 ENCOUNTER — Inpatient Hospital Stay: Attending: Adult Health | Admitting: Adult Health

## 2024-10-02 ENCOUNTER — Encounter: Payer: Self-pay | Admitting: Adult Health

## 2024-10-02 VITALS — BP 134/73 | HR 60 | Temp 98.2°F | Resp 16 | Ht 66.0 in | Wt 171.2 lb

## 2024-10-02 DIAGNOSIS — N63 Unspecified lump in unspecified breast: Secondary | ICD-10-CM | POA: Diagnosis not present

## 2024-10-02 DIAGNOSIS — Z1721 Progesterone receptor positive status: Secondary | ICD-10-CM | POA: Insufficient documentation

## 2024-10-02 DIAGNOSIS — Z83719 Family history of colon polyps, unspecified: Secondary | ICD-10-CM | POA: Insufficient documentation

## 2024-10-02 DIAGNOSIS — Z803 Family history of malignant neoplasm of breast: Secondary | ICD-10-CM | POA: Diagnosis not present

## 2024-10-02 DIAGNOSIS — F17210 Nicotine dependence, cigarettes, uncomplicated: Secondary | ICD-10-CM | POA: Insufficient documentation

## 2024-10-02 DIAGNOSIS — Z79811 Long term (current) use of aromatase inhibitors: Secondary | ICD-10-CM | POA: Insufficient documentation

## 2024-10-02 DIAGNOSIS — C50412 Malignant neoplasm of upper-outer quadrant of left female breast: Secondary | ICD-10-CM | POA: Diagnosis not present

## 2024-10-02 DIAGNOSIS — Z1732 Human epidermal growth factor receptor 2 negative status: Secondary | ICD-10-CM | POA: Diagnosis not present

## 2024-10-02 DIAGNOSIS — Z800 Family history of malignant neoplasm of digestive organs: Secondary | ICD-10-CM | POA: Insufficient documentation

## 2024-10-02 DIAGNOSIS — Z170 Estrogen receptor positive status [ER+]: Secondary | ICD-10-CM | POA: Diagnosis not present

## 2024-10-02 DIAGNOSIS — Z923 Personal history of irradiation: Secondary | ICD-10-CM | POA: Diagnosis not present

## 2024-10-02 DIAGNOSIS — N6320 Unspecified lump in the left breast, unspecified quadrant: Secondary | ICD-10-CM | POA: Diagnosis not present

## 2024-10-02 NOTE — Progress Notes (Unsigned)
 [facility] Cancer Follow up:    Annette Barnie NOVAK, MD [address] [place] [address] [place] [address]   DIAGNOSIS: Cancer Staging  Malignant neoplasm of upper-outer quadrant of left breast in female, estrogen receptor positive (HCC) Staging form: Breast, AJCC 8th Edition - Clinical: Stage IB (cT2, cN0, cM0, G2, ER+, PR+, HER2-) - Signed by Lanell Donald Stagger, PA-C on 10/18/2021 Method of lymph node assessment: Clinical Histologic grading system: 3 grade system    SUMMARY OF ONCOLOGIC HISTORY: Oncology History  Malignant neoplasm of upper-outer quadrant of left breast in female, estrogen receptor positive (HCC)  10/16/2021 Initial Diagnosis   Malignant neoplasm of upper-outer quadrant of left breast in female, estrogen receptor positive (HCC)   10/18/2021 Cancer Staging   Staging form: Breast, AJCC 8th Edition - Clinical: Stage IB (cT2, cN0, cM0, G2, ER+, PR+, HER2-) - Signed by Lanell Donald Stagger, PA-C on 10/18/2021 Method of lymph node assessment: Clinical Histologic grading system: 3 grade system    Genetic Testing   [facility] CancerNext-Expanded Panel was Negative. Report date is 11/13/2021.  The CancerNext-Expanded gene panel offered by [facility] and includes sequencing, rearrangement, and RNA analysis for the following 77 genes: AIP, ALK, APC, ATM, AXIN2, BAP1, BARD1, BLM, BMPR1A, BRCA1, BRCA2, BRIP1, CDC73, CDH1, CDK4, CDKN1B, CDKN2A, CHEK2, CTNNA1, DICER1, FANCC, FH, FLCN, GALNT12, KIF1B, LZTR1, MAX, MEN1, MET, MLH1, MSH2, MSH3, MSH6, MUTYH, NBN, NF1, NF2, NTHL1, PALB2, PHOX2B, PMS2, POT1, PRKAR1A, PTCH1, PTEN, RAD51C, RAD51D, RB1, RECQL, RET, SDHA, SDHAF2, SDHB, SDHC, SDHD, SMAD4, SMARCA4, SMARCB1, SMARCE1, STK11, SUFU, TMEM127, TP53, TSC1, TSC2, VHL and XRCC2 (sequencing and deletion/duplication); EGFR, EGLN1, HOXB13, KIT, MITF, PDGFRA, POLD1, and POLE (sequencing only); EPCAM and GREM1 (deletion/duplication only).    11/16/2021 Surgery   She underwent left  breast seed localized lumpectomy with left axillary sentinel lymph node mapping. Surgical pathology showed invasive ductal carcinoma, grade 2, 3.6 cm in maximal extent, tumor approaches to less than 1 mm from closest resection margin, DCIS, intermediate grade, no metastatic carcinoma in the any of the lymph nodes.  Prior prognostic showed ER 100% positive, strong staining, PR 100% positive, strong staining, HER2 negative and Ki-67 of 5%   12/07/2021 Oncotype testing   Oncotype of 4, group salute chemotherapy benefit less than 1%.  Distant recurrence risk at 9 years with AI or Tam is 3%.   01/09/2022 - 02/07/2022 Radiation Therapy   Site Technique Total Dose (Gy) Dose per Fx (Gy) Completed Fx Beam Energies  Breast, Left: Breast_L 3D 42.56/42.56 2.66 16/16 10X, 6XFFF  Breast, Left: Breast_L_Bst 3D 10/10 2.5 4/4 10X     01/2022 -  Anti-estrogen oral therapy   Anastrozole      CURRENT THERAPY: Anastrozole   INTERVAL HISTORY:  Discussed the use of AI scribe software for clinical note transcription with the patient, who gave verbal consent to proceed.  History of Present Illness Annette Richard is a 69 year old female with estrogen receptor positive left breast cancer who presents for evaluation of a new left breast lump.  She noticed a new lump in the left breast near the end of last year, with exact onset unclear due to recovery from hip replacement and physical therapy. She had not felt this lump before. She has no associated skin changes, nipple discharge, or pain.  She was diagnosed with left breast cancer in 2022 and was treated with splenectomy, adjuvant radiation, and ongoing anastrozole . Her mammogram in March 2025 showed no evidence of malignancy with density category B. She is worried  about the new palpable abnormality.     Patient Active Problem List   Diagnosis Date Noted   Status post total replacement of left hip 02/06/2024   Painful orthopaedic hardware 11/04/2023    Avascular necrosis of bone of hip, left (HCC) 09/05/2023   Osteopenia of both hips 10/18/2022   Genetic testing 11/13/2021   Family history of pancreatic cancer 10/18/2021   Family history of breast cancer 10/18/2021   Malignant neoplasm of upper-outer quadrant of left breast in female, estrogen receptor positive (HCC) 10/16/2021   Mass of upper inner quadrant of left breast 09/14/2021   Tobacco dependence 09/14/2021   Normocytic anemia 09/14/2021   Vitamin D  deficiency 09/14/2021   Aortic atherosclerosis 09/14/2021   Anterior dislocation of left hip (HCC) 07/12/2021   Closed displaced fracture of greater trochanter of left femur (HCC) 07/12/2021   S/p left hip fracture 07/10/2021   Plantar fasciitis 02/19/2018   TIA (transient ischemic attack) 06/26/2016   Mixed hyperlipidemia 03/25/2015   Essential hypertension 03/24/2015   GERD (gastroesophageal reflux disease) 05/12/2011   Environmental allergies 05/12/2011    has no known allergies.  MEDICAL HISTORY: Past Medical History:  Diagnosis Date   Arthritis    Breast cancer (HCC)    Left Breast   Hyperlipidemia    Hypertension    Osteopenia    Pneumonia    PONV (postoperative nausea and vomiting)    nausea only - after hip surgery 10/2023   Stroke (HCC) 06/26/2016   TIA    SURGICAL HISTORY: Past Surgical History:  Procedure Laterality Date   ABDOMINAL HYSTERECTOMY     APPENDECTOMY     removed with right salpingoophorectomy   BREAST LUMPECTOMY WITH RADIOACTIVE SEED AND SENTINEL LYMPH NODE BIOPSY Left 11/16/2021   Procedure: LEFT BREAST LUMPECTOMY WITH RADIOACTIVE SEED AND SENTINEL LYMPH NODE BIOPSY;  Surgeon: Vanderbilt Ned, MD;  Location: [facility];  Service: General;  Laterality: Left;   CATARACT EXTRACTION W/ INTRAOCULAR LENS IMPLANT Bilateral 2024   CHOLECYSTECTOMY     2000s   FACIAL LACERATION REPAIR N/A 07/10/2021   Procedure: FACIAL LACERATION REPAIR;  Surgeon:  Paola Dreama SAILOR, MD;  Location: [facility] OR;  Service: General;  Laterality: N/A;   HARDWARE REMOVAL Left 11/04/2023   Procedure: LEFT HIP HARDWARE REMOVAL, BONEGRAFTING;  Surgeon: Jerri Kay HERO, MD;  Location: [facility] OR;  Service: Orthopedics;  Laterality: Left;   HIP CLOSED REDUCTION Left 07/10/2021   Procedure: ATTEMPTED CLOSED REDUCTION HIP, OPEN REDUCTION LEFT HIP;  Surgeon: Kendal Franky SQUIBB, MD;  Location: [facility] OR;  Service: Orthopedics;  Laterality: Left;   KNEE ARTHROSCOPY WITH MENISCAL REPAIR Right    SALPINGECTOMY Left    Ectopic Pregnancy   SALPINGOOPHORECTOMY Right    TOTAL HIP ARTHROPLASTY Left 02/06/2024   Procedure: ARTHROPLASTY, HIP, TOTAL, ANTERIOR APPROACH;  Surgeon: Jerri Kay HERO, MD;  Location: [facility] OR;  Service: Orthopedics;  Laterality: Left;  3-C    SOCIAL HISTORY: Social History   Socioeconomic History   Marital status: Widowed    Spouse name: Not on file   Number of children: 2   Years of education: Not on file   Highest education level: Associate degree: occupational, scientist, product/process development, or vocational program  Occupational History   Occupation: Retired LPN  Tobacco Use   Smoking status: Every Day    Current packs/day: 1.00    Average packs/day: 1 pack/day for 15.0 years (15.0 ttl pk-yrs)    Types: Cigarettes   Smokeless tobacco: Never   Tobacco comments:  Less than one pack of cigarettes as of 10/30/23  Vaping Use   Vaping status: Never Used  Substance and Sexual Activity   Alcohol use: Not Currently    Comment: Maybe once a year   Drug use: Never   Sexual activity: Never    Birth control/protection: Abstinence  Other Topics Concern   Not on file  Social History Narrative   ** Merged History Encounter **       Social Drivers of Health   Tobacco Use: High Risk (10/02/2024)   Patient History    Smoking Tobacco Use: Every Day    Smokeless Tobacco Use: Never    Passive Exposure: Not on file  Financial Resource Strain: High Risk (07/20/2024)    Overall Financial Resource Strain (CARDIA)    Difficulty of Paying Living Expenses: Very hard  Food Insecurity: Food Insecurity Present (07/20/2024)   Epic    Worried About Programme Researcher, Broadcasting/film/video in the Last Year: Often true    Ran Out of Food in the Last Year: Often true  Transportation Needs: Unmet Transportation Needs (07/20/2024)   Epic    Lack of Transportation (Medical): Yes    Lack of Transportation (Non-Medical): Yes  Physical Activity: Sufficiently Active (07/20/2024)   Exercise Vital Sign    Days of Exercise per Week: 5 days    Minutes of Exercise per Session: 40 min  Stress: No Stress Concern Present (07/20/2024)   [employer] of Occupational Health - Occupational Stress Questionnaire    Feeling of Stress: Only a little  Social Connections: Socially Isolated (07/20/2024)   Social Connection and Isolation Panel    Frequency of Communication with Friends and Family: Never    Frequency of Social Gatherings with Friends and Family: Never    Attends Religious Services: Never    Database Administrator or Organizations: No    Attends Engineer, Structural: Not on file    Marital Status: Widowed  Intimate Partner Violence: Not At Risk (04/20/2024)   Epic    Fear of Current or Ex-Partner: No    Emotionally Abused: No    Physically Abused: No    Sexually Abused: No  Depression (PHQ2-9): Low Risk (10/02/2024)   Depression (PHQ2-9)    PHQ-2 Score: 0  Alcohol Screen: Low Risk (02/11/2024)   Alcohol Screen    Last Alcohol Screening Score (AUDIT): 0  Housing: Low Risk (07/20/2024)   Epic    Unable to Pay for Housing in the Last Year: No    Number of Times Moved in the Last Year: 0    Homeless in the Last Year: No  Utilities: Not At Risk (04/20/2024)   Epic    Threatened with loss of utilities: No  Health Literacy: Adequate Health Literacy (02/11/2024)   B1300 Health Literacy    Frequency of need for help with medical instructions: Never    FAMILY  HISTORY: Family History  Problem Relation Age of Onset   Pancreatic cancer Mother 68   Mesothelioma Father 57   Breast cancer Sister 23       reports negative genetic testing   Colon polyps Sister        precancerous   Breast cancer Sister        dx. 30s   Pulmonary fibrosis Brother    CVA Maternal Grandfather    Breast cancer Maternal Great-grandmother     Review of Systems - Oncology    PHYSICAL EXAMINATION   Onc Performance Status - 10/02/24 1324  ECOG Perf Status   ECOG Perf Status Restricted in physically strenuous activity but ambulatory and able to carry out work of a light or sedentary nature, e.g., light house work, office work      KPS SCALE   KPS % SCORE Able to carry on normal activity, minor s/s of disease          Vitals:   10/02/24 1323  BP: 134/73  Pulse: 60  Resp: 16  Temp: 98.2 F (36.8 C)  SpO2: 99%    Physical Exam  LABORATORY DATA:  CBC    Component Value Date/Time   WBC 7.2 01/28/2024 1100   RBC 4.99 01/28/2024 1100   HGB 14.2 01/28/2024 1100   HGB 14.6 11/29/2022 1317   HGB 13.9 09/14/2021 1042   HCT 42.7 01/28/2024 1100   HCT 42.9 09/14/2021 1042   PLT 259 01/28/2024 1100   PLT 268 11/29/2022 1317   PLT 354 09/14/2021 1042   MCV 85.6 01/28/2024 1100   MCV 88 09/14/2021 1042   MCV 87 02/14/2014 0359   MCH 28.5 01/28/2024 1100   MCHC 33.3 01/28/2024 1100   RDW 13.4 01/28/2024 1100   RDW 13.3 09/14/2021 1042   RDW 13.5 02/14/2014 0359   LYMPHSABS 2.0 11/29/2022 1317   LYMPHSABS 1.3 02/14/2014 0359   MONOABS 0.6 11/29/2022 1317   MONOABS 0.5 02/14/2014 0359   EOSABS 0.2 11/29/2022 1317   EOSABS 0.1 02/14/2014 0359   BASOSABS 0.1 11/29/2022 1317   BASOSABS 0.0 02/14/2014 0359    CMP     Component Value Date/Time   NA 144 07/20/2024 1219   NA 142 02/14/2014 0359   K 3.9 07/20/2024 1219   K 3.9 02/14/2014 0359   CL 106 07/20/2024 1219   CL 112 (H) 02/14/2014 0359   CO2 22 07/20/2024 1219   CO2 29  02/14/2014 0359   GLUCOSE 86 07/20/2024 1219   GLUCOSE 108 (H) 01/28/2024 1100   GLUCOSE 83 02/14/2014 0359   BUN 14 07/20/2024 1219   BUN 7 02/14/2014 0359   CREATININE 0.81 07/20/2024 1219   CREATININE 0.87 11/29/2022 1317   CREATININE 0.90 02/14/2014 0359   CALCIUM  10.3 07/20/2024 1219   CALCIUM  8.2 (L) 02/14/2014 0359   PROT 6.9 11/18/2023 1524   PROT 7.4 02/13/2014 1210   ALBUMIN 4.2 11/18/2023 1524   ALBUMIN 3.9 02/13/2014 1210   AST 14 11/18/2023 1524   AST 16 11/29/2022 1317   ALT 10 11/18/2023 1524   ALT 11 11/29/2022 1317   ALT 15 02/13/2014 1210   ALKPHOS 136 (H) 11/18/2023 1524   ALKPHOS 95 02/13/2014 1210   BILITOT 0.3 11/18/2023 1524   BILITOT 0.4 11/29/2022 1317   GFRNONAA >60 01/28/2024 1100   GFRNONAA >60 11/29/2022 1317   GFRNONAA >60 02/14/2014 0359   GFRAA >60 06/27/2016 0522   GFRAA >60 02/14/2014 0359     ASSESSMENT and THERAPY PLAN:   No problem-specific Assessment & Plan notes found for this encounter.   Assessment and Plan Assessment & Plan Malignant neoplasm of upper-outer quadrant of left breast, estrogen receptor positive Estrogen receptor positive breast cancer, post-splenectomy and adjuvant radiation, on anastrozole . No recurrence on recent mammogram. - Continued anastrozole  therapy.  Left breast lump New palpable left breast mass, not a discrete nodule. Differential includes fat necrosis, fibrous changes, or other etiologies. Further imaging needed. - Ordered diagnostic mammogram and breast ultrasound at [facility]. - Instructed her to notify if not contacted for scheduling by Monday. - Provided anticipatory guidance  regarding the evaluation process and encouraged her to remain engaged in activities while awaiting results.      All questions were answered. The patient knows to call the clinic with any problems, questions or concerns. We can certainly see the patient much sooner if necessary.  Total encounter time:*** minutes*in  face-to-face visit time, chart review, lab review, care coordination, order entry, and documentation of the encounter time.    Morna Kendall, NP 10/02/24 1:30 PM Medical Oncology and Hematology [facility] [address] [place], [place] [address] Tel. [phone]    Fax. [phone]  *Total Encounter Time as defined by the Centers for Medicare and Medicaid Services includes, in addition to the face-to-face time of a patient visit (documented in the note above) non-face-to-face time: obtaining and reviewing outside history, ordering and reviewing medications, tests or procedures, care coordination (communications with other health care professionals or caregivers) and documentation in the medical record.

## 2024-12-07 ENCOUNTER — Ambulatory Visit

## 2025-01-19 ENCOUNTER — Ambulatory Visit: Admitting: Physician Assistant

## 2025-02-16 ENCOUNTER — Ambulatory Visit

## 2025-05-28 ENCOUNTER — Ambulatory Visit: Admitting: Hematology and Oncology
